# Patient Record
Sex: Female | Born: 1960 | Race: Black or African American | Hispanic: No | Marital: Married | Smoking: Never smoker
Health system: Southern US, Community
[De-identification: ages and names within clinical notes are randomized; demographics above are authoritative.]

## PROBLEM LIST (undated history)

## (undated) DIAGNOSIS — J40 Bronchitis, not specified as acute or chronic: Secondary | ICD-10-CM

## (undated) DIAGNOSIS — E669 Obesity, unspecified: Secondary | ICD-10-CM

## (undated) DIAGNOSIS — I10 Essential (primary) hypertension: Secondary | ICD-10-CM

## (undated) DIAGNOSIS — D573 Sickle-cell trait: Secondary | ICD-10-CM

## (undated) DIAGNOSIS — Z87442 Personal history of urinary calculi: Secondary | ICD-10-CM

## (undated) DIAGNOSIS — M1990 Unspecified osteoarthritis, unspecified site: Secondary | ICD-10-CM

## (undated) HISTORY — PX: CYST EXCISION: SHX5701

## (undated) HISTORY — PX: TUBAL LIGATION: SHX77

## (undated) HISTORY — DX: Obesity, unspecified: E66.9

## (undated) HISTORY — DX: Sickle-cell trait: D57.3

## (undated) HISTORY — PX: BACK SURGERY: SHX140

## (undated) HISTORY — PX: WRIST SURGERY: SHX841

---

## 2001-04-05 ENCOUNTER — Ambulatory Visit (HOSPITAL_COMMUNITY): Admission: RE | Admit: 2001-04-05 | Discharge: 2001-04-05 | Payer: Self-pay | Admitting: Family Medicine

## 2003-09-07 ENCOUNTER — Ambulatory Visit (HOSPITAL_COMMUNITY): Admission: RE | Admit: 2003-09-07 | Discharge: 2003-09-07 | Payer: Self-pay | Admitting: Family Medicine

## 2003-09-07 ENCOUNTER — Encounter: Payer: Self-pay | Admitting: Family Medicine

## 2003-12-21 ENCOUNTER — Ambulatory Visit (HOSPITAL_COMMUNITY): Admission: RE | Admit: 2003-12-21 | Discharge: 2003-12-21 | Payer: Self-pay | Admitting: Family Medicine

## 2004-02-03 ENCOUNTER — Emergency Department (HOSPITAL_COMMUNITY): Admission: EM | Admit: 2004-02-03 | Discharge: 2004-02-03 | Payer: Self-pay | Admitting: Emergency Medicine

## 2005-01-12 ENCOUNTER — Ambulatory Visit (HOSPITAL_COMMUNITY): Admission: RE | Admit: 2005-01-12 | Discharge: 2005-01-12 | Payer: Self-pay | Admitting: Family Medicine

## 2007-09-18 ENCOUNTER — Ambulatory Visit (HOSPITAL_COMMUNITY): Admission: EM | Admit: 2007-09-18 | Discharge: 2007-09-18 | Payer: Self-pay | Admitting: Emergency Medicine

## 2007-09-19 ENCOUNTER — Ambulatory Visit (HOSPITAL_COMMUNITY): Admission: RE | Admit: 2007-09-19 | Discharge: 2007-09-19 | Payer: Self-pay | Admitting: Family Medicine

## 2007-10-03 ENCOUNTER — Ambulatory Visit: Payer: Self-pay | Admitting: Orthopedic Surgery

## 2007-10-10 ENCOUNTER — Encounter: Payer: Self-pay | Admitting: Orthopedic Surgery

## 2009-09-10 ENCOUNTER — Encounter: Payer: Self-pay | Admitting: Orthopedic Surgery

## 2009-09-10 ENCOUNTER — Ambulatory Visit (HOSPITAL_COMMUNITY): Admission: RE | Admit: 2009-09-10 | Discharge: 2009-09-10 | Payer: Self-pay | Admitting: Family Medicine

## 2009-10-02 ENCOUNTER — Encounter (HOSPITAL_COMMUNITY): Admission: RE | Admit: 2009-10-02 | Discharge: 2009-11-01 | Payer: Self-pay | Admitting: Orthopedic Surgery

## 2010-01-26 ENCOUNTER — Emergency Department (HOSPITAL_COMMUNITY): Admission: EM | Admit: 2010-01-26 | Discharge: 2010-01-26 | Payer: Self-pay | Admitting: Emergency Medicine

## 2010-02-20 DIAGNOSIS — Z8679 Personal history of other diseases of the circulatory system: Secondary | ICD-10-CM | POA: Insufficient documentation

## 2010-02-24 ENCOUNTER — Ambulatory Visit: Payer: Self-pay | Admitting: Orthopedic Surgery

## 2010-02-24 DIAGNOSIS — M549 Dorsalgia, unspecified: Secondary | ICD-10-CM | POA: Insufficient documentation

## 2010-02-24 DIAGNOSIS — M5126 Other intervertebral disc displacement, lumbar region: Secondary | ICD-10-CM

## 2010-02-26 ENCOUNTER — Encounter (INDEPENDENT_AMBULATORY_CARE_PROVIDER_SITE_OTHER): Payer: Self-pay | Admitting: *Deleted

## 2010-03-01 ENCOUNTER — Encounter: Payer: Self-pay | Admitting: Orthopedic Surgery

## 2010-03-05 ENCOUNTER — Telehealth: Payer: Self-pay | Admitting: Orthopedic Surgery

## 2010-03-06 ENCOUNTER — Encounter (INDEPENDENT_AMBULATORY_CARE_PROVIDER_SITE_OTHER): Payer: Self-pay | Admitting: *Deleted

## 2010-03-11 ENCOUNTER — Telehealth: Payer: Self-pay | Admitting: Orthopedic Surgery

## 2010-03-18 ENCOUNTER — Encounter: Payer: Self-pay | Admitting: Orthopedic Surgery

## 2010-03-21 ENCOUNTER — Ambulatory Visit (HOSPITAL_COMMUNITY): Admission: RE | Admit: 2010-03-21 | Discharge: 2010-03-22 | Payer: Self-pay | Admitting: Neurosurgery

## 2010-04-15 ENCOUNTER — Encounter: Payer: Self-pay | Admitting: Orthopedic Surgery

## 2010-05-05 ENCOUNTER — Encounter: Payer: Self-pay | Admitting: Orthopedic Surgery

## 2010-05-24 ENCOUNTER — Emergency Department (HOSPITAL_COMMUNITY): Admission: EM | Admit: 2010-05-24 | Discharge: 2010-05-24 | Payer: Self-pay | Admitting: Emergency Medicine

## 2010-05-29 ENCOUNTER — Encounter: Payer: Self-pay | Admitting: Orthopedic Surgery

## 2010-06-10 ENCOUNTER — Encounter: Payer: Self-pay | Admitting: Orthopedic Surgery

## 2010-06-17 ENCOUNTER — Encounter: Admission: RE | Admit: 2010-06-17 | Discharge: 2010-06-17 | Payer: Self-pay | Admitting: Neurosurgery

## 2010-07-01 ENCOUNTER — Encounter: Admission: RE | Admit: 2010-07-01 | Discharge: 2010-07-01 | Payer: Self-pay | Admitting: Neurosurgery

## 2010-07-08 ENCOUNTER — Encounter: Payer: Self-pay | Admitting: Orthopedic Surgery

## 2010-07-23 ENCOUNTER — Encounter: Admission: RE | Admit: 2010-07-23 | Discharge: 2010-07-23 | Payer: Self-pay | Admitting: Neurosurgery

## 2010-08-11 ENCOUNTER — Encounter: Payer: Self-pay | Admitting: Orthopedic Surgery

## 2010-08-27 ENCOUNTER — Ambulatory Visit (HOSPITAL_COMMUNITY): Admission: RE | Admit: 2010-08-27 | Discharge: 2010-08-28 | Payer: Self-pay | Admitting: Neurosurgery

## 2010-10-17 ENCOUNTER — Encounter: Payer: Self-pay | Admitting: Orthopedic Surgery

## 2010-10-27 ENCOUNTER — Encounter: Payer: Self-pay | Admitting: Orthopedic Surgery

## 2010-11-25 ENCOUNTER — Encounter: Payer: Self-pay | Admitting: Orthopedic Surgery

## 2011-01-18 ENCOUNTER — Encounter: Payer: Self-pay | Admitting: Otolaryngology

## 2011-01-29 NOTE — Letter (Signed)
Summary: [facility] office note Dr Letitia Libra office note Dr Franky Macho   Imported By: Cammie Sickle 10/18/2010 19:09:11  _____________________________________________________________________  External Attachment:    Type:   Image     Comment:   External Document

## 2011-01-29 NOTE — Miscellaneous (Signed)
Summary: L5-S1 esi order  Clinical Lists Changes  Orders: Added new Referral order of Misc. Referral (Misc. Ref) - Signed

## 2011-01-29 NOTE — Consult Note (Signed)
Summary: Consultation Report from Dr, Coletta Memos  Consultation Report from Dr, Coletta Memos   Imported By: Jacklynn Ganong 12/23/2010 16:42:45  _____________________________________________________________________  External Attachment:    Type:   Image     Comment:   External Document

## 2011-01-29 NOTE — Letter (Signed)
Summary: History form  History form   Imported By: Jacklynn Ganong 02/25/2010 13:04:56  _____________________________________________________________________  External Attachment:    Type:   Image     Comment:   External Document

## 2011-01-29 NOTE — Letter (Signed)
Summary: *Orthopedic Consult Note  [facility]  [address] [place] [address]  [place], [place] [address]   Phone: [phone]  Fax: [phone]    Re:    TIFINI REEDER DOB:    [birth year]   Dear: Dr. Regino Schultze    Thank you for requesting that we see the above patient for consultation.  A copy of the detailed office note will be sent under separate cover, for your review.  Evaluation today is consistent with:  1)  H N P-LUMBAR (ICD-722.10) 2)  BACK PAIN (ICD-724.5)     Our recommendation is for: steroid dose pack and MRI then either ESI or Neurosurgery consult. Continue same medications as you are doing then taper off Norco.        Thank you for this opportunity to look after your patient.  Sincerely,   Terrance Mass. MD.

## 2011-01-29 NOTE — Letter (Signed)
Summary: [facility] office note Dr Letitia Libra office note Dr Franky Macho   Imported By: Cammie Sickle 07/10/2010 10:01:39  _____________________________________________________________________  External Attachment:    Type:   Image     Comment:   External Document

## 2011-01-29 NOTE — Progress Notes (Signed)
Summary: Neurosurgeon appointment.  Phone Note From Other Clinic   Caller: Referral Coordinator Summary of Call: Patient has an appointment with Dr. Franky Macho on 03-18-10 at 2:00. Patient is aware of her appointment.

## 2011-01-29 NOTE — Letter (Signed)
Summary: [facility] Office notes Dr Letitia Libra Office notes Dr Franky Macho   Imported By: Cammie Sickle 08/19/2010 11:55:32  _____________________________________________________________________  External Attachment:    Type:   Image     Comment:   External Document

## 2011-01-29 NOTE — Progress Notes (Signed)
Summary: Initial evaluation  Initial evaluation   Imported By: Jacklynn Ganong 02/21/2010 07:45:40  _____________________________________________________________________  External Attachment:    Type:   Image     Comment:   External Document

## 2011-01-29 NOTE — Letter (Signed)
Summary: [facility] office note Dr Letitia Libra office note Dr Franky Macho   Imported By: Cammie Sickle 06/20/2010 09:35:12  _____________________________________________________________________  External Attachment:    Type:   Image     Comment:   External Document

## 2011-01-29 NOTE — Letter (Signed)
Summary: [facility] office note Dr Letitia Libra office note Dr Franky Macho   Imported By: Cammie Sickle 05/28/2010 11:27:55  _____________________________________________________________________  External Attachment:    Type:   Image     Comment:   External Document

## 2011-01-29 NOTE — Miscellaneous (Signed)
Summary: open mri 03/01/10 at 915am [facility]  Clinical Lists Changes  Precert number [id number] expires 45 days from today, patient to be called with results of MRI of the L spine [facility]. I left Message on machine for patient.

## 2011-01-29 NOTE — Letter (Signed)
Summary: [facility] office note Dr Letitia Libra office note Dr Franky Macho   Imported By: Cammie Sickle 07/30/2010 10:03:43  _____________________________________________________________________  External Attachment:    Type:   Image     Comment:   External Document

## 2011-01-29 NOTE — Assessment & Plan Note (Signed)
Summary: NEW PROB/AP ER FOL/UP/LT HIP PAIN/HAD XR AP 01/26/10/[facility]   Vital Signs:  Patient profile:   50 year old female Weight:      309 pounds Pulse rate:   70 / minute Resp:     16 per minute  Vitals Entered By: Fuller Canada MD (February 24, 2010 1:29 PM)  Visit Type:  new patient Referring Provider:  ap er Primary Provider:  Dr. Regino Schultze  CC:  left hip pain.  History of Present Illness: 50 year-old female presents with LEFT hip pain, which is actually in her region and present for 2 months. No injury came on gradually she complains of severe throbbing constant pain all day and worse at night. Her pain is unrelieved by Norco 7.5, and diclofenac 75 b.i.d. she was seen in [place], had physical therapy. She does have a history of sciatica  Xrays Left hip 01/26/10 and L spine 09/10/09.  Meds: Lisinopril.  Was given Norco 7.5 and Diclofenac 75mg  from er 01/26/10, no relief.        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 Cardiac :  Denies chest pain, angina, heart attack, heart failure, poor circulation, blood clots, and phlebitis. Resp:  Denies short of breath, difficulty breathing, COPD, cough, and pneumonia. GI:  Denies nausea, vomiting, diarrhea, constipation, difficulty swallowing, ulcers, GERD, and reflux. GU:  Denies kidney failure, kidney transplant, kidney stones, burning, poor stream, testicular cancer, blood in urine, and . Neuro:  Denies headache, dizziness, migraines, numbness, weakness, tremor, and unsteady walking. MS:  Denies joint pain, rheumatoid  arthritis, joint swelling, gout, bone cancer, osteoporosis, and . Endo:  Denies thyroid disease, goiter, and diabetes. Psych:  Denies depression, mood swings, anxiety, panic attack, bipolar, and schizophrenia. Derm:  Denies eczema, cancer, and itching. EENT:  Denies poor vision, cataracts, glaucoma, poor hearing, vertigo, ears ringing, sinusitis, hoarseness, toothaches, and bleeding gums; wears glasses. Immunology:  Denies seasonal allergies, sinus problems, and allergic to bee stings. Lymphatic:  Denies lymph node cancer and lymph edema.  Physical Exam  Additional Exam:  GEN: well developed, well nourished, normal grooming and hygiene, no deformity and obesity  CDV: pulses are normal, no edema, no erythema. no tenderness  Lymph: normal lymph nodes   Skin: no rashes, skin lesions or open sores   NEURO: normal coordination, reflexes, sensation.   Psyche: awake, alert and oriented. Mood normal   Gait: normal  Lumbar spine is tender in the midline and increased lumbar lordosis, tender over the LEFT lumbar area.  Normal motor strength in both lower extremities no joint laxity.     Impression & Recommendations:  Problem # 1:  H N P-LUMBAR (ICD-722.10) Assessment New x-rays at the hospital include hip which were normal 3 views and lumbar spine which shows a scoliotic spine which is not structural  Patient was seen and going up her arm as well and they sent her for therapy  She needs an MRI and then either an epidural or a neurosurgical consult  I would like to try Dosepak and see if that helps if not she can continue on her present medications as  ordered by her primary care physician Orders: New Patient Level III (82956)  Problem # 2:  BACK PAIN (ICD-724.5) Assessment: New  Orders: New Patient Level III (21308)  Medications Added to Medication List This Visit: 1)  Prednisone (pak) 10 Mg Tabs (Prednisone) .... As directed for 12 days  Patient Instructions: 1)  MRI we  will call with results Prescriptions: PREDNISONE (PAK) 10 MG TABS (PREDNISONE) as directed for 12 days  #1 x 0   Entered and Authorized by:   Fuller Canada MD   Signed by:   Fuller Canada MD on 02/24/2010   Method used:   Print then Give to Patient   RxID:   [id number]

## 2011-01-29 NOTE — Letter (Signed)
Summary: [facility] Office notes Dr Letitia Libra Office notes Dr Franky Macho   Imported By: Cammie Sickle 05/21/2010 12:17:09  _____________________________________________________________________  External Attachment:    Type:   Image     Comment:   External Document

## 2011-01-29 NOTE — Miscellaneous (Signed)
Summary: [facility] order  Clinical Lists Changes  Orders: Added new Referral order of Neurosurgeon Referral (Neurosurgeon) - Signed

## 2011-01-29 NOTE — Letter (Signed)
Summary: [facility] Office notes Dr Letitia Libra Office notes Dr Franky Macho   Imported By: Cammie Sickle 11/21/2010 13:49:17  _____________________________________________________________________  External Attachment:    Type:   Image     Comment:   External Document

## 2011-01-29 NOTE — Progress Notes (Signed)
Summary: patient called MRI report  Phone Note Call from Patient   Caller: Patient Summary of Call: Patient calling to find out results of MRI. Report rec'd and she had dropped off films.  Home ph [phone]. Initial call taken by: Cammie Sickle,  March 05, 2010 4:53 PM

## 2011-01-29 NOTE — Consult Note (Signed)
Summary: Consult [facility] Dr Coletta Memos  Consult [facility] Dr Coletta Memos   Imported By: Cammie Sickle 04/03/2010 15:35:21  _____________________________________________________________________  External Attachment:    Type:   Image     Comment:   External Document

## 2011-03-13 LAB — BASIC METABOLIC PANEL
BUN: 7 mg/dL (ref 6–23)
CO2: 23 mEq/L (ref 19–32)
Calcium: 9.4 mg/dL (ref 8.4–10.5)
Chloride: 104 mEq/L (ref 96–112)
Creatinine, Ser: 0.83 mg/dL (ref 0.4–1.2)
GFR calc Af Amer: >60 mL/min (ref >60)
GFR calc non Af Amer: >60 mL/min (ref >60)
Glucose, Bld: 86 mg/dL (ref 70–99)
Potassium: 3.8 mEq/L (ref 3.5–5.1)
Sodium: 135 mEq/L (ref 135–145)

## 2011-03-13 LAB — CBC
HCT: 35.3 % — ABNORMAL LOW (ref 36.0–46.0)
Hemoglobin: 11.8 g/dL — ABNORMAL LOW (ref 12.0–15.0)
MCH: 27.1 pg (ref 26.0–34.0)
MCHC: 33.4 g/dL (ref 30.0–36.0)
MCV: 81.1 fL (ref 78.0–100.0)
Platelets: 254 10*3/uL (ref 150–400)
RBC: 4.35 MIL/uL (ref 3.87–5.11)
RDW: 14.2 % (ref 11.5–15.5)
WBC: 9.1 10*3/uL (ref 4.0–10.5)

## 2011-03-13 LAB — SURGICAL PCR SCREEN
MRSA, PCR: NEGATIVE
Staphylococcus aureus: NEGATIVE

## 2011-03-13 LAB — HCG, SERUM, QUALITATIVE: Preg, Serum: NEGATIVE

## 2011-03-23 LAB — COMPREHENSIVE METABOLIC PANEL
ALT: 16 U/L (ref 0–35)
AST: 18 U/L (ref 0–37)
Albumin: 4.2 g/dL (ref 3.5–5.2)
Alkaline Phosphatase: 64 U/L (ref 39–117)
BUN: 12 mg/dL (ref 6–23)
CO2: 26 mEq/L (ref 19–32)
Calcium: 9.8 mg/dL (ref 8.4–10.5)
Chloride: 100 mEq/L (ref 96–112)
Creatinine, Ser: 0.98 mg/dL (ref 0.4–1.2)
GFR calc Af Amer: >60 mL/min (ref >60)
GFR calc non Af Amer: >60 mL/min (ref >60)
Glucose, Bld: 90 mg/dL (ref 70–99)
Potassium: 4 mEq/L (ref 3.5–5.1)
Sodium: 137 mEq/L (ref 135–145)
Total Bilirubin: 0.6 mg/dL (ref 0.3–1.2)
Total Protein: 7.6 g/dL (ref 6.0–8.3)

## 2011-03-23 LAB — URINE MICROSCOPIC-ADD ON

## 2011-03-23 LAB — CBC
HCT: 36.7 % (ref 36.0–46.0)
Hemoglobin: 12.2 g/dL (ref 12.0–15.0)
MCHC: 33.3 g/dL (ref 30.0–36.0)
MCV: 80.3 fL (ref 78.0–100.0)
Platelets: 278 10*3/uL (ref 150–400)
RBC: 4.57 MIL/uL (ref 3.87–5.11)
RDW: 14.9 % (ref 11.5–15.5)
WBC: 10 10*3/uL (ref 4.0–10.5)

## 2011-03-23 LAB — URINALYSIS, ROUTINE W REFLEX MICROSCOPIC
Bilirubin Urine: NEGATIVE
Glucose, UA: NEGATIVE mg/dL
Ketones, ur: NEGATIVE mg/dL
Nitrite: NEGATIVE
Protein, ur: NEGATIVE mg/dL
Specific Gravity, Urine: 1.012 (ref 1.005–1.030)
Urobilinogen, UA: 0.2 mg/dL (ref 0.0–1.0)
pH: 6.5 (ref 5.0–8.0)

## 2011-03-23 LAB — SURGICAL PCR SCREEN
MRSA, PCR: NEGATIVE
Staphylococcus aureus: NEGATIVE

## 2011-04-11 ENCOUNTER — Emergency Department (HOSPITAL_COMMUNITY)
Admission: EM | Admit: 2011-04-11 | Discharge: 2011-04-11 | Disposition: A | Discharge status: Home / Self Care | Payer: 59 | Attending: Emergency Medicine | Admitting: Emergency Medicine

## 2011-04-11 DIAGNOSIS — M549 Dorsalgia, unspecified: Secondary | ICD-10-CM | POA: Insufficient documentation

## 2011-04-11 DIAGNOSIS — X500XXA Overexertion from strenuous movement or load, initial encounter: Secondary | ICD-10-CM | POA: Insufficient documentation

## 2011-04-11 DIAGNOSIS — M542 Cervicalgia: Secondary | ICD-10-CM | POA: Insufficient documentation

## 2011-04-11 DIAGNOSIS — Z79899 Other long term (current) drug therapy: Secondary | ICD-10-CM | POA: Insufficient documentation

## 2011-04-11 DIAGNOSIS — IMO0002 Reserved for concepts with insufficient information to code with codable children: Secondary | ICD-10-CM | POA: Insufficient documentation

## 2011-04-11 DIAGNOSIS — Y929 Unspecified place or not applicable: Secondary | ICD-10-CM | POA: Insufficient documentation

## 2011-05-27 ENCOUNTER — Other Ambulatory Visit: Payer: Self-pay | Admitting: Obstetrics and Gynecology

## 2011-05-27 ENCOUNTER — Other Ambulatory Visit (HOSPITAL_COMMUNITY)
Admission: RE | Admit: 2011-05-27 | Discharge: 2011-05-27 | Disposition: A | Discharge status: Home / Self Care | Payer: 59 | Source: Ambulatory Visit | Attending: Obstetrics and Gynecology | Admitting: Obstetrics and Gynecology

## 2011-05-27 DIAGNOSIS — Z139 Encounter for screening, unspecified: Secondary | ICD-10-CM

## 2011-05-27 DIAGNOSIS — Z01419 Encounter for gynecological examination (general) (routine) without abnormal findings: Secondary | ICD-10-CM | POA: Insufficient documentation

## 2011-06-02 ENCOUNTER — Ambulatory Visit (HOSPITAL_COMMUNITY)
Admission: RE | Admit: 2011-06-02 | Discharge: 2011-06-02 | Disposition: A | Discharge status: Home / Self Care | Payer: 59 | Source: Ambulatory Visit | Attending: Obstetrics and Gynecology | Admitting: Obstetrics and Gynecology

## 2011-06-02 DIAGNOSIS — Z139 Encounter for screening, unspecified: Secondary | ICD-10-CM

## 2011-06-02 DIAGNOSIS — Z1231 Encounter for screening mammogram for malignant neoplasm of breast: Secondary | ICD-10-CM | POA: Insufficient documentation

## 2011-06-05 ENCOUNTER — Other Ambulatory Visit: Payer: Self-pay | Admitting: Obstetrics and Gynecology

## 2011-06-05 DIAGNOSIS — R928 Other abnormal and inconclusive findings on diagnostic imaging of breast: Secondary | ICD-10-CM

## 2011-06-17 ENCOUNTER — Ambulatory Visit (HOSPITAL_COMMUNITY)
Admission: RE | Admit: 2011-06-17 | Discharge: 2011-06-17 | Disposition: A | Discharge status: Home / Self Care | Payer: 59 | Source: Ambulatory Visit | Attending: Obstetrics and Gynecology | Admitting: Obstetrics and Gynecology

## 2011-06-17 ENCOUNTER — Other Ambulatory Visit: Payer: Self-pay | Admitting: Obstetrics and Gynecology

## 2011-06-17 ENCOUNTER — Other Ambulatory Visit (HOSPITAL_COMMUNITY): Payer: Self-pay | Admitting: Obstetrics and Gynecology

## 2011-06-17 DIAGNOSIS — R928 Other abnormal and inconclusive findings on diagnostic imaging of breast: Secondary | ICD-10-CM

## 2011-06-17 DIAGNOSIS — Z09 Encounter for follow-up examination after completed treatment for conditions other than malignant neoplasm: Secondary | ICD-10-CM

## 2011-10-25 ENCOUNTER — Emergency Department (HOSPITAL_COMMUNITY)
Admission: EM | Admit: 2011-10-25 | Discharge: 2011-10-25 | Disposition: A | Discharge status: Home / Self Care | Payer: BC Managed Care – PPO | Attending: Emergency Medicine | Admitting: Emergency Medicine

## 2011-10-25 ENCOUNTER — Emergency Department (HOSPITAL_COMMUNITY): Payer: BC Managed Care – PPO

## 2011-10-25 DIAGNOSIS — R0602 Shortness of breath: Secondary | ICD-10-CM | POA: Insufficient documentation

## 2011-10-25 DIAGNOSIS — R062 Wheezing: Secondary | ICD-10-CM | POA: Insufficient documentation

## 2011-10-25 DIAGNOSIS — IMO0001 Reserved for inherently not codable concepts without codable children: Secondary | ICD-10-CM | POA: Insufficient documentation

## 2011-10-25 DIAGNOSIS — R05 Cough: Secondary | ICD-10-CM | POA: Insufficient documentation

## 2011-10-25 DIAGNOSIS — R51 Headache: Secondary | ICD-10-CM | POA: Insufficient documentation

## 2011-10-25 DIAGNOSIS — I10 Essential (primary) hypertension: Secondary | ICD-10-CM | POA: Insufficient documentation

## 2011-10-25 DIAGNOSIS — R059 Cough, unspecified: Secondary | ICD-10-CM | POA: Insufficient documentation

## 2011-10-25 DIAGNOSIS — R0789 Other chest pain: Secondary | ICD-10-CM | POA: Insufficient documentation

## 2011-10-25 HISTORY — DX: Essential (primary) hypertension: I10

## 2011-10-25 MED ORDER — HYDROCOD POLST-CHLORPHEN POLST 10-8 MG/5ML PO LQCR: ORAL | Status: DC

## 2011-10-25 MED ORDER — AZITHROMYCIN 250 MG PO TABS
500.0000 mg | ORAL_TABLET | Freq: Once | ORAL | Status: AC
  Administered 2011-10-25: 500 mg via ORAL
  Filled 2011-10-25: qty 2

## 2011-10-25 MED ORDER — AZITHROMYCIN 250 MG PO TABS: ORAL_TABLET | ORAL | Status: DC

## 2011-10-25 NOTE — ED Provider Notes (Signed)
History     CSN: [id number] Arrival date & time: 10/25/2011  9:56 AM   First MD Initiated Contact with Patient 10/25/11 [phone]      Chief Complaint  Patient presents with  . Cough    (Consider location/radiation/quality/duration/timing/severity/associated sxs/prior treatment) HPI Comments: Patient c/o persistent, intermittent cough for two weeks.  States she was seen by PMD at the onset of symptoms and given a "steroid shot" and an inhaler but patient states the symptoms have not improved.  She also c/o headache , laryngitis, and body aches.  She denies fever, vomiting, chest pain or LE edema.  Also states that she has had recent contact with children who have similar symptoms  Patient is a 50 y.o. female presenting with cough. The history is provided by the patient.  Cough This is a new problem. The current episode started more than 1 week ago. The problem occurs every few minutes. The problem has been gradually worsening. The cough is non-productive. There has been no fever. Associated symptoms include chills, headaches, rhinorrhea, myalgias, shortness of breath and wheezing. Pertinent negatives include no chest pain, no weight loss, no ear congestion, no ear pain, no sore throat and no eye redness. She has tried cough syrup and decongestants for the symptoms. The treatment provided no relief. She is not a smoker. Her past medical history is significant for asthma. Her past medical history does not include pneumonia or COPD.    Past Medical History  Diagnosis Date  . Hypertension     Past Surgical History  Procedure Date  . Back surgery     History reviewed. No pertinent family history.  History  Substance Use Topics  . Smoking status: Not on file  . Smokeless tobacco: Not on file  . Alcohol Use: Yes    OB History    Grav Para Term Preterm Abortions TAB SAB Ect Mult Living                  Review of Systems  Constitutional: Positive for chills. Negative for fever,  weight loss, activity change, appetite change and fatigue.  HENT: Positive for rhinorrhea. Negative for ear pain, sore throat, trouble swallowing, neck pain and neck stiffness.   Eyes: Negative for redness and visual disturbance.  Respiratory: Positive for cough, chest tightness, shortness of breath and wheezing.   Cardiovascular: Negative for chest pain and palpitations.  Gastrointestinal: Negative for nausea, vomiting and abdominal pain.  Genitourinary: Negative for dysuria and flank pain.  Musculoskeletal: Positive for myalgias. Negative for back pain and arthralgias.  Skin: Negative for rash.  Neurological: Positive for headaches. Negative for dizziness, weakness and numbness.  Hematological: Does not bruise/bleed easily.  All other systems reviewed and are negative.    Allergies  Review of patient's allergies indicates no known allergies.  Home Medications   Current Outpatient Rx  Name Route Sig Dispense Refill  . ALBUTEROL SULFATE HFA 108 (90 BASE) MCG/ACT IN AERS Inhalation Inhale 2 puffs into the lungs every 6 (six) hours as needed. For shortness of breath     . VITAMIN D 1000 UNITS PO TABS Oral Take 1,000 Units by mouth daily.      . IBUPROFEN 200 MG PO TABS Oral Take 400 mg by mouth every 6 (six) hours as needed. For headaches     . LISINOPRIL-HYDROCHLOROTHIAZIDE 20-12.5 MG PO TABS Oral Take 1 tablet by mouth daily.      . OMEGA-3-ACID ETHYL ESTERS 1 G PO CAPS Oral Take 1 g by  mouth 2 (two) times daily.      Marland Kitchen VITAMIN C 500 MG PO TABS Oral Take 500 mg by mouth daily.        BP 140/92  Pulse 64  Temp(Src) 98.2 F (36.8 C) (Oral)  Resp 20  Ht 5\' 4"  (1.626 m)  Wt 300 lb (136.079 kg)  BMI 51.49 kg/m2  SpO2 100%  LMP 10/04/2011  Physical Exam  Nursing note and vitals reviewed. Constitutional: She is oriented to person, place, and time. She appears well-developed and well-nourished. No distress.  HENT:  Head: Normocephalic and atraumatic. No trismus in the jaw.    Right Ear: Tympanic membrane normal. No mastoid tenderness. No hemotympanum.  Left Ear: Tympanic membrane normal. No mastoid tenderness. No hemotympanum.  Nose: Nose normal.  Mouth/Throat: Uvula is midline, oropharynx is clear and moist and mucous membranes are normal. No uvula swelling.       Voice is coarse  Neck: Normal range of motion. Neck supple.  Cardiovascular: Normal rate, regular rhythm and normal heart sounds.   Pulmonary/Chest: Effort normal and breath sounds normal. No respiratory distress. She has no decreased breath sounds. She has no wheezes. She has no rhonchi. She has no rales. She exhibits no mass and no tenderness.  Abdominal: Soft. She exhibits no distension. There is no tenderness.  Musculoskeletal: Normal range of motion. She exhibits no edema and no tenderness.  Lymphadenopathy:    She has no cervical adenopathy.  Neurological: She is alert and oriented to person, place, and time. She has normal reflexes. No cranial nerve deficit. She exhibits normal muscle tone. Coordination normal.  Skin: Skin is warm and dry.    ED Course  Procedures (including critical care time)  Dg Chest 2 View  10/25/2011  *RADIOLOGY REPORT*  Clinical Data: Cough, fever  CHEST - 2 VIEW  Comparison: 03/20/2010  Findings: Cardiomediastinal silhouette is stable.  No pulmonary edema.  There is left base retrocardiac atelectasis or infiltrate. Bony thorax is stable.  IMPRESSION: No pulmonary edema.  Left base retrocardiac atelectasis or infiltrate.  Original Report Authenticated By: Natasha Mead, M.D.       MDM    10:45 AM patient is smiling and alert, NAD.  Vitals are stable.  No fever, hypoxia, tachycardia, or tachypnea.  Lung sounds are CTA bilaterally, my clinical suspicion for pneumonia is low.  Given persistence of her sx's and x-ray finding, I will start antibiotic and advised her to continue the inhaler as directed.  She agrees to close up with her PMD this week.    Patient / Family /  Caregiver understand and agree with initial ED impression and plan with expectations set for ED visit.         Timber Lucarelli L. Deklynn Charlet, PA 10/25/11 1050

## 2011-10-25 NOTE — ED Notes (Signed)
Complain of cough, and sob for two weeks. States she has been to her pcp and got an inhaler

## 2011-10-25 NOTE — ED Provider Notes (Signed)
Medical screening examination/treatment/procedure(s) were performed by non-physician practitioner and as supervising physician I was immediately available for consultation/collaboration.   Shelda Jakes, MD 10/25/11 1055

## 2012-01-26 ENCOUNTER — Other Ambulatory Visit: Payer: Self-pay | Admitting: Obstetrics and Gynecology

## 2012-01-26 DIAGNOSIS — Z139 Encounter for screening, unspecified: Secondary | ICD-10-CM

## 2012-02-03 ENCOUNTER — Ambulatory Visit (HOSPITAL_COMMUNITY)
Admission: RE | Admit: 2012-02-03 | Discharge: 2012-02-03 | Disposition: A | Discharge status: Home / Self Care | Payer: BC Managed Care – PPO | Source: Ambulatory Visit | Attending: Obstetrics and Gynecology | Admitting: Obstetrics and Gynecology

## 2012-02-03 DIAGNOSIS — Z09 Encounter for follow-up examination after completed treatment for conditions other than malignant neoplasm: Secondary | ICD-10-CM | POA: Insufficient documentation

## 2012-02-03 DIAGNOSIS — Z139 Encounter for screening, unspecified: Secondary | ICD-10-CM

## 2012-02-03 DIAGNOSIS — N6489 Other specified disorders of breast: Secondary | ICD-10-CM | POA: Insufficient documentation

## 2012-04-01 ENCOUNTER — Emergency Department (HOSPITAL_COMMUNITY)
Admission: EM | Admit: 2012-04-01 | Discharge: 2012-04-01 | Disposition: A | Discharge status: Home / Self Care | Payer: BC Managed Care – PPO | Attending: Emergency Medicine | Admitting: Emergency Medicine

## 2012-04-01 ENCOUNTER — Emergency Department (HOSPITAL_COMMUNITY): Payer: BC Managed Care – PPO

## 2012-04-01 ENCOUNTER — Encounter (HOSPITAL_COMMUNITY): Payer: Self-pay

## 2012-04-01 DIAGNOSIS — I10 Essential (primary) hypertension: Secondary | ICD-10-CM | POA: Insufficient documentation

## 2012-04-01 DIAGNOSIS — M25519 Pain in unspecified shoulder: Secondary | ICD-10-CM | POA: Insufficient documentation

## 2012-04-01 DIAGNOSIS — M546 Pain in thoracic spine: Secondary | ICD-10-CM | POA: Insufficient documentation

## 2012-04-01 DIAGNOSIS — R51 Headache: Secondary | ICD-10-CM | POA: Insufficient documentation

## 2012-04-01 DIAGNOSIS — T148XXA Other injury of unspecified body region, initial encounter: Secondary | ICD-10-CM | POA: Insufficient documentation

## 2012-04-01 DIAGNOSIS — R209 Unspecified disturbances of skin sensation: Secondary | ICD-10-CM | POA: Insufficient documentation

## 2012-04-01 DIAGNOSIS — M542 Cervicalgia: Secondary | ICD-10-CM | POA: Insufficient documentation

## 2012-04-01 DIAGNOSIS — X58XXXA Exposure to other specified factors, initial encounter: Secondary | ICD-10-CM | POA: Insufficient documentation

## 2012-04-01 DIAGNOSIS — IMO0001 Reserved for inherently not codable concepts without codable children: Secondary | ICD-10-CM | POA: Insufficient documentation

## 2012-04-01 MED ORDER — HYDROCODONE-ACETAMINOPHEN 5-325 MG PO TABS: ORAL_TABLET | ORAL | Status: AC

## 2012-04-01 MED ORDER — METHOCARBAMOL 500 MG PO TABS: ORAL_TABLET | ORAL | Status: DC

## 2012-04-01 NOTE — Discharge Instructions (Signed)
Muscle Strain A muscle strain (pulled muscle) happens when a muscle is over-stretched. Recovery usually takes 5 to 6 weeks.  HOME CARE   Put ice on the injured area.   Put ice in a plastic bag.   Place a towel between your skin and the bag.   Leave the ice on for 15 to 20 minutes at a time, every hour for the first 2 days.   Do not use the muscle for several days or until your doctor says you can. Do not use the muscle if you have pain.   Wrap the injured area with an elastic bandage for comfort. Do not put it on too tightly.   Only take medicine as told by your doctor.   Warm up before exercise. This helps prevent muscle strains.  GET HELP RIGHT AWAY IF:  There is increased pain or puffiness (swelling) in the affected area. MAKE SURE YOU:   Understand these instructions.   Will watch your condition.   Will get help right away if you are not doing well or get worse.  Document Released: 09/22/2008 Document Revised: 12/03/2011 Document Reviewed: 09/22/2008 [employer] Patient Information 2012 [employer]. 

## 2012-04-01 NOTE — ED Notes (Signed)
Pt presents with intermittent back, shoulder, neck and head pain x 2 months. Pt with Hx of back surgery.

## 2012-04-01 NOTE — ED Provider Notes (Signed)
History     CSN: [id number]  Arrival date & time 04/01/12  1335   First MD Initiated Contact with Patient 04/01/12 1407      Chief Complaint  Patient presents with  . Back Pain  . Shoulder Pain  . Neck Pain  . Headache    (Consider location/radiation/quality/duration/timing/severity/associated sxs/prior treatment) HPI Comments: Patient complains of pain to her left neck, left upper back and shoulder for 2 months. She also reports intermittent numbness and tingling sensations to her left arm. She states pain is worse with movement of her neck or abduction of her left arm. Pain improves somewhat with rest and ibuprofen she describes the pain as a aching with intermittent sharp pains. She denies chest pain, dyspnea, nausea, headaches, visual changes, neck stiffness or vomiting.  Patient is a 51 y.o. female presenting with shoulder pain. The history is provided by the patient. No language interpreter was used.  Shoulder Pain This is a chronic problem. The current episode started more than 1 month ago. The problem occurs constantly. The problem has been gradually worsening. Associated symptoms include arthralgias, myalgias, neck pain and numbness. Pertinent negatives include no abdominal pain, chest pain, coughing, fever, headaches, joint swelling, nausea, rash, sore throat, swollen glands, vertigo, visual change, vomiting or weakness. The symptoms are aggravated by twisting (Movement and palpation). She has tried NSAIDs for the symptoms. The treatment provided moderate relief.    Past Medical History  Diagnosis Date  . Hypertension     Past Surgical History  Procedure Date  . Back surgery     No family history on file.  History  Substance Use Topics  . Smoking status: Not on file  . Smokeless tobacco: Not on file  . Alcohol Use: Yes    OB History    Grav Para Term Preterm Abortions TAB SAB Ect Mult Living                  Review of Systems  Constitutional: Negative for  fever, activity change and appetite change.  HENT: Positive for neck pain. Negative for sore throat, facial swelling, trouble swallowing and neck stiffness.   Respiratory: Negative for cough and shortness of breath.   Cardiovascular: Negative for chest pain.  Gastrointestinal: Negative for nausea, vomiting and abdominal pain.  Genitourinary: Negative for flank pain and difficulty urinating.  Musculoskeletal: Positive for myalgias, back pain and arthralgias. Negative for joint swelling.  Skin: Negative.  Negative for rash.  Neurological: Positive for numbness. Negative for dizziness, vertigo, facial asymmetry, weakness and headaches.  All other systems reviewed and are negative.    Allergies  Review of patient's allergies indicates no known allergies.  Home Medications   Current Outpatient Rx  Name Route Sig Dispense Refill  . ALBUTEROL SULFATE HFA 108 (90 BASE) MCG/ACT IN AERS Inhalation Inhale 2 puffs into the lungs every 6 (six) hours as needed. For shortness of breath     . VITAMIN D 1000 UNITS PO TABS Oral Take 1,000 Units by mouth daily.      . OMEGA-3 FATTY ACIDS 1000 MG PO CAPS Oral Take 1 g by mouth 2 (two) times daily.    . IBUPROFEN 200 MG PO TABS Oral Take 400 mg by mouth every 6 (six) hours as needed. For headaches     . LISINOPRIL-HYDROCHLOROTHIAZIDE 20-12.5 MG PO TABS Oral Take 1 tablet by mouth daily.      Marland Kitchen VITAMIN C 500 MG PO TABS Oral Take 500 mg by mouth daily.      Marland Kitchen  HYDROCODONE-ACETAMINOPHEN 5-325 MG PO TABS  Take one-two tabs po q 4-6 hrs prn pain 20 tablet 0  . METHOCARBAMOL 500 MG PO TABS  Take two tabs po TID prn muscle spasms 42 tablet 0    BP 117/80  Pulse 73  Temp(Src) 97.8 F (36.6 C) (Oral)  Ht 5\' 4"  (1.626 m)  Wt 295 lb 6 oz (133.981 kg)  BMI 50.70 kg/m2  SpO2 100%  LMP 03/26/2012  Physical Exam  Nursing note and vitals reviewed. Constitutional: She is oriented to person, place, and time. She appears well-developed and well-nourished. No  distress.  HENT:  Head: Normocephalic and atraumatic.  Mouth/Throat: Oropharynx is clear and moist.  Neck: Normal range of motion and phonation normal. Neck supple. Muscular tenderness present. No spinous process tenderness present. No rigidity. No edema, no erythema and normal range of motion present. No Brudzinski's sign and no Kernig's sign noted.  Cardiovascular: Normal rate, regular rhythm, normal heart sounds and intact distal pulses.   No murmur heard. Pulmonary/Chest: Effort normal and breath sounds normal. No respiratory distress. She exhibits no tenderness.  Abdominal: There is no tenderness.  Musculoskeletal: Normal range of motion. She exhibits tenderness. She exhibits no edema.       Cervical back: She exhibits tenderness and pain. She exhibits normal range of motion, no bony tenderness, no swelling, no edema and normal pulse.       Back:       Tenderness to palpation along the left cervical paraspinal muscles and trapezius and along the border of the left scapula.  Lymphadenopathy:    She has no cervical adenopathy.  Neurological: She is alert and oriented to person, place, and time. She has normal reflexes. She exhibits normal muscle tone. Coordination normal.  Skin: Skin is warm and dry.    ED Course  Procedures (including critical care time)  Labs Reviewed - No data to display Dg Cervical Spine Complete  04/01/2012  *RADIOLOGY REPORT*  Clinical Data: Neck pain extends into the left shoulder for 4-5 days.  CERVICAL SPINE - 4+ VIEWS  Comparison:  None.  Findings:  There is no evidence of cervical spine fracture or prevertebral soft tissue swelling.  Alignment is normal.  No other significant bone abnormalities are identified.With regard to the specific symptoms, the left sided neural foramina appear widely patent, and there is no lesion in the left lung apex or left upper ribs.  Incidental note is made of a roughly spherical 6 mm calcific density on the right side of the neck  in the submandibular space, likely representing a submandibular gland calculus.  IMPRESSION:  No acute or focal cervical spine abnormality.  Probable 6 mm right submandibular gland calculus.  Original Report Authenticated By: Elsie Stain, M.D.   Dg Shoulder Left  04/01/2012  *RADIOLOGY REPORT*  Clinical Data:  Left neck pain extends into the shoulder for 4-5 days.  LEFT SHOULDER - 2+ VIEW  Comparison:  None.  Findings:  There is no evidence of fracture or dislocation.  There is no evidence of arthropathy or other focal bone abnormality. Soft tissues are unremarkable.  IMPRESSION: Negative.  Original Report Authenticated By: Elsie Stain, M.D.     1. Muscle strain       MDM    Vital signs are stable. Patient is nontoxic appearing. Tenderness to palpation of the left cervical paraspinal muscles and left trapezius muscles that also radiates around the border of the scapula. Pain is reproduced with abduction of the left arm.  Distal sensation is intact, grip strength is strong and equal bilaterally, radial pulse is brisk, capillary refill is less than 2 seconds. Pain is likely  musculoskeletal in origin. I also explained to her that this could possibly be a cervical radiculopathy.  Patient agrees to followup with her primary care physician next week if her symptoms are not improving or to return to ER if her symptoms worsen.  Patient / Family / Caregiver understand and agree with initial ED impression and plan with expectations set for ED visit. Pt stable in ED with no significant deterioration in condition. Pt feels improved after observation and/or treatment in ED.        Lynell Kussman L. Ryleigh Esqueda, [place] 04/01/12 1720

## 2012-04-01 NOTE — ED Notes (Signed)
C/o pain to left shoulder and left neck, unable to turn head to left due to pain, states pain has been unbearable for last 3 days, attempted to see PCP for last two days and was told that office was not seeing anymore walk-ins and pt was not able to make an appt til next week

## 2012-04-02 NOTE — ED Provider Notes (Signed)
Medical screening examination/treatment/procedure(s) were performed by non-physician practitioner and as supervising physician I was immediately available for consultation/collaboration.   Uldine Fuster L Hattye Siegfried, MD 04/02/12 0926 

## 2012-04-22 ENCOUNTER — Other Ambulatory Visit (HOSPITAL_COMMUNITY): Payer: Self-pay | Admitting: Neurosurgery

## 2012-04-22 DIAGNOSIS — M542 Cervicalgia: Secondary | ICD-10-CM

## 2012-04-26 ENCOUNTER — Ambulatory Visit (HOSPITAL_COMMUNITY)
Admission: RE | Admit: 2012-04-26 | Discharge: 2012-04-26 | Disposition: A | Discharge status: Home / Self Care | Payer: BC Managed Care – PPO | Source: Ambulatory Visit | Attending: Neurosurgery | Admitting: Neurosurgery

## 2012-04-26 DIAGNOSIS — M542 Cervicalgia: Secondary | ICD-10-CM | POA: Insufficient documentation

## 2012-05-03 ENCOUNTER — Other Ambulatory Visit (HOSPITAL_COMMUNITY): Payer: Self-pay | Admitting: Neurosurgery

## 2012-05-03 DIAGNOSIS — M545 Low back pain: Secondary | ICD-10-CM

## 2012-05-05 ENCOUNTER — Ambulatory Visit (HOSPITAL_COMMUNITY)
Admission: RE | Admit: 2012-05-05 | Discharge: 2012-05-05 | Disposition: A | Discharge status: Home / Self Care | Payer: BC Managed Care – PPO | Source: Ambulatory Visit | Attending: Neurosurgery | Admitting: Neurosurgery

## 2012-05-05 DIAGNOSIS — M5126 Other intervertebral disc displacement, lumbar region: Secondary | ICD-10-CM | POA: Insufficient documentation

## 2012-05-05 DIAGNOSIS — M545 Low back pain, unspecified: Secondary | ICD-10-CM | POA: Insufficient documentation

## 2012-05-05 LAB — BUN: BUN: 11 mg/dL (ref 6–23)

## 2012-05-05 LAB — CREATININE, SERUM: GFR calc Af Amer: 86 mL/min — ABNORMAL LOW (ref >90)

## 2012-05-05 MED ORDER — GADOBENATE DIMEGLUMINE 529 MG/ML IV SOLN
20.0000 mL | Freq: Once | INTRAVENOUS | Status: AC | PRN
  Administered 2012-05-05: 20 mL via INTRAVENOUS

## 2012-05-09 ENCOUNTER — Other Ambulatory Visit (HOSPITAL_COMMUNITY): Payer: Self-pay | Admitting: Internal Medicine

## 2012-05-09 ENCOUNTER — Ambulatory Visit (HOSPITAL_COMMUNITY)
Admission: RE | Admit: 2012-05-09 | Discharge: 2012-05-09 | Disposition: A | Discharge status: Home / Self Care | Payer: BC Managed Care – PPO | Source: Ambulatory Visit | Attending: Internal Medicine | Admitting: Internal Medicine

## 2012-05-09 DIAGNOSIS — M7989 Other specified soft tissue disorders: Secondary | ICD-10-CM

## 2012-05-09 DIAGNOSIS — M79609 Pain in unspecified limb: Secondary | ICD-10-CM | POA: Insufficient documentation

## 2012-09-06 ENCOUNTER — Other Ambulatory Visit (HOSPITAL_COMMUNITY): Payer: Self-pay | Admitting: Family Medicine

## 2012-09-06 DIAGNOSIS — IMO0001 Reserved for inherently not codable concepts without codable children: Secondary | ICD-10-CM

## 2012-09-19 ENCOUNTER — Ambulatory Visit (HOSPITAL_COMMUNITY)
Admission: RE | Admit: 2012-09-19 | Discharge: 2012-09-19 | Disposition: A | Discharge status: Home / Self Care | Payer: BC Managed Care – PPO | Source: Ambulatory Visit | Attending: Family Medicine | Admitting: Family Medicine

## 2012-09-19 DIAGNOSIS — IMO0001 Reserved for inherently not codable concepts without codable children: Secondary | ICD-10-CM

## 2012-09-19 DIAGNOSIS — Z1231 Encounter for screening mammogram for malignant neoplasm of breast: Secondary | ICD-10-CM | POA: Insufficient documentation

## 2012-10-07 ENCOUNTER — Emergency Department (HOSPITAL_COMMUNITY)
Admission: EM | Admit: 2012-10-07 | Discharge: 2012-10-07 | Disposition: A | Discharge status: Home / Self Care | Payer: BC Managed Care – PPO | Attending: Emergency Medicine | Admitting: Emergency Medicine

## 2012-10-07 ENCOUNTER — Encounter (HOSPITAL_COMMUNITY): Payer: Self-pay | Admitting: *Deleted

## 2012-10-07 DIAGNOSIS — G8929 Other chronic pain: Secondary | ICD-10-CM | POA: Insufficient documentation

## 2012-10-07 DIAGNOSIS — I10 Essential (primary) hypertension: Secondary | ICD-10-CM | POA: Insufficient documentation

## 2012-10-07 DIAGNOSIS — M545 Low back pain, unspecified: Secondary | ICD-10-CM | POA: Insufficient documentation

## 2012-10-07 DIAGNOSIS — N390 Urinary tract infection, site not specified: Secondary | ICD-10-CM

## 2012-10-07 DIAGNOSIS — Z760 Encounter for issue of repeat prescription: Secondary | ICD-10-CM | POA: Insufficient documentation

## 2012-10-07 LAB — URINALYSIS, ROUTINE W REFLEX MICROSCOPIC
Glucose, UA: NEGATIVE mg/dL
Ketones, ur: NEGATIVE mg/dL
Nitrite: NEGATIVE
Specific Gravity, Urine: 1.02 (ref 1.005–1.030)
pH: 7 (ref 5.0–8.0)

## 2012-10-07 LAB — URINE MICROSCOPIC-ADD ON

## 2012-10-07 MED ORDER — HYDROCODONE-ACETAMINOPHEN 5-325 MG PO TABS: 1.0000 | ORAL_TABLET | Freq: Four times a day (QID) | ORAL | Status: AC | PRN

## 2012-10-07 MED ORDER — SULFAMETHOXAZOLE-TMP DS 800-160 MG PO TABS
1.0000 | ORAL_TABLET | Freq: Once | ORAL | Status: AC
  Administered 2012-10-07: 1 via ORAL
  Filled 2012-10-07: qty 1

## 2012-10-07 MED ORDER — SULFAMETHOXAZOLE-TRIMETHOPRIM 800-160 MG PO TABS: 1.0000 | ORAL_TABLET | Freq: Two times a day (BID) | ORAL | Status: DC

## 2012-10-07 NOTE — ED Provider Notes (Signed)
History     CSN: [id number]  Arrival date & time 10/07/12  1509   First MD Initiated Contact with Patient 10/07/12 1608      Chief Complaint  Patient presents with  . Hematuria    (Consider location/radiation/quality/duration/timing/severity/associated sxs/prior treatment) HPI Comments: Pt frequently has low back pain but she attributes that to her lumbar DDD.  Recent laminectomy by dr. Franky Macho.  Denies fever.  No other complaints.  Patient is a 51 y.o. female presenting with hematuria. The history is provided by the patient. No language interpreter was used.  Hematuria This is a new problem. Episode onset: 2-3 days ago. The problem is unchanged. She describes the hematuria as gross hematuria. She reports no clotting in her urine stream. Her pain is at a severity of 7/10. Irritative symptoms include urgency. Irritative symptoms do not include frequency. Obstructive symptoms do not include incomplete emptying. Associated symptoms include chills, dysuria and hesitancy. Pertinent negatives include no abdominal pain, fever, flank pain, nausea or vomiting. There is no history of kidney stones or recent infection.    Past Medical History  Diagnosis Date  . Hypertension     Past Surgical History  Procedure Date  . Back surgery   . Wrist surgery   . Tubal ligation     History reviewed. No pertinent family history.  History  Substance Use Topics  . Smoking status: Never Smoker   . Smokeless tobacco: Not on file  . Alcohol Use: No    OB History    Grav Para Term Preterm Abortions TAB SAB Ect Mult Living                  Review of Systems  Constitutional: Positive for chills. Negative for fever.  Gastrointestinal: Negative for nausea, vomiting and abdominal pain.  Genitourinary: Positive for dysuria, hesitancy, urgency and hematuria. Negative for frequency, flank pain, vaginal bleeding and incomplete emptying.  Musculoskeletal: Positive for back pain.  All other systems  reviewed and are negative.    Allergies  Review of patient's allergies indicates no known allergies.  Home Medications   Current Outpatient Rx  Name Route Sig Dispense Refill  . VITAMIN D 1000 UNITS PO TABS Oral Take 1,000 Units by mouth daily.      . OMEGA-3 FATTY ACIDS 1000 MG PO CAPS Oral Take 1 g by mouth 2 (two) times daily.    Marland Kitchen LISINOPRIL-HYDROCHLOROTHIAZIDE 20-12.5 MG PO TABS Oral Take 1 tablet by mouth daily.      Marland Kitchen VITAMIN C 500 MG PO TABS Oral Take 500 mg by mouth daily.      . ALBUTEROL SULFATE HFA 108 (90 BASE) MCG/ACT IN AERS Inhalation Inhale 2 puffs into the lungs every 6 (six) hours as needed. For shortness of breath     . HYDROCODONE-ACETAMINOPHEN 5-325 MG PO TABS Oral Take 1 tablet by mouth every 6 (six) hours as needed for pain. 20 tablet 0  . IBUPROFEN 200 MG PO TABS Oral Take 400 mg by mouth every 6 (six) hours as needed. For headaches     . SULFAMETHOXAZOLE-TRIMETHOPRIM 800-160 MG PO TABS Oral Take 1 tablet by mouth every 12 (twelve) hours. 10 tablet 0    BP 121/71  Pulse 66  Temp 98.8 F (37.1 C) (Oral)  Resp 20  Ht 5\' 4"  (1.626 m)  Wt 302 lb (136.986 kg)  BMI 51.84 kg/m2  SpO2 100%  LMP 09/30/2012  Physical Exam  Nursing note and vitals reviewed. Constitutional: She is oriented to person,  place, and time. She appears well-developed and well-nourished. No distress.  HENT:  Head: Normocephalic and atraumatic.  Eyes: EOM are normal.  Neck: Normal range of motion.  Cardiovascular: Normal rate, regular rhythm and normal heart sounds.   Pulmonary/Chest: Effort normal and breath sounds normal.  Abdominal: Soft. She exhibits no distension. There is no tenderness.  Genitourinary: No vaginal discharge found.  Musculoskeletal: She exhibits no tenderness.       Lumbar back: She exhibits decreased range of motion and pain. She exhibits no tenderness, no bony tenderness, no swelling, no deformity, no spasm and normal pulse.       Back:  Neurological: She is  alert and oriented to person, place, and time.  Skin: Skin is warm and dry.  Psychiatric: She has a normal mood and affect. Judgment normal.    ED Course  Procedures (including critical care time)  Labs Reviewed  URINALYSIS, ROUTINE W REFLEX MICROSCOPIC - Abnormal; Notable for the following:    Color, Urine STRAW (*)     APPearance CLOUDY (*)     Hgb urine dipstick LARGE (*)     Protein, ur 30 (*)     Leukocytes, UA LARGE (*)     All other components within normal limits  URINE MICROSCOPIC-ADD ON - Abnormal; Notable for the following:    Squamous Epithelial / LPF MANY (*)     Bacteria, UA FEW (*)     All other components within normal limits  URINE CULTURE   No results found.   1. UTI (urinary tract infection)   2. Prescription refill   3. Chronic low back pain       MDM  Urine cx pending. rx-bactrim DS, 10 rx-hydrocodone, 20 Drink plenty of fluids.   F/u with PCP Call dr. Franky Macho if you  Need more pain meds.        Evalina Field, [place] 10/07/12 [phone]

## 2012-10-07 NOTE — ED Notes (Signed)
Hematuria,dysuria, and back pain,  No fever.

## 2012-10-08 NOTE — ED Provider Notes (Signed)
Medical screening examination/treatment/procedure(s) were performed by non-physician practitioner and as supervising physician I was immediately available for consultation/collaboration. Shaterica Mcclatchy, MD, FACEP   Eriq Hufford L Shirleen Mcfaul, MD 10/08/12 0914 

## 2012-10-10 LAB — URINE CULTURE

## 2012-10-11 NOTE — ED Notes (Signed)
+   urine Patient treated with septra-sensitive to same-chart appended per protocol MD. 

## 2013-07-07 ENCOUNTER — Other Ambulatory Visit: Payer: Self-pay

## 2013-07-07 ENCOUNTER — Telehealth: Payer: Self-pay

## 2013-07-07 DIAGNOSIS — Z1211 Encounter for screening for malignant neoplasm of colon: Secondary | ICD-10-CM

## 2013-07-07 NOTE — Telephone Encounter (Signed)
Pt called to be set up for her tcs. She can be reached at [phone] or [phone]

## 2013-07-07 NOTE — Telephone Encounter (Signed)
Tried to call pt. LMOM to call and that we leave at noon today.

## 2013-07-11 ENCOUNTER — Encounter (HOSPITAL_COMMUNITY): Payer: Self-pay | Admitting: Pharmacy Technician

## 2013-07-12 ENCOUNTER — Encounter (INDEPENDENT_AMBULATORY_CARE_PROVIDER_SITE_OTHER): Payer: Self-pay

## 2013-07-12 NOTE — Telephone Encounter (Signed)
Gastroenterology Pre-Procedure Review  Request Date: 07/07/2013   Requesting Physician: Dr. Regino Schultze  PATIENT REVIEW QUESTIONS: The patient responded to the following health history questions as indicated:    1. Diabetes Melitis: no 2. Joint replacements in the past 12 months: no 3. Major health problems in the past 3 months: no 4. Has an artificial valve or MVP: no 5. Has a defibrillator: no 6. Has been advised in past to take antibiotics in advance of a procedure like teeth cleaning: no    MEDICATIONS & ALLERGIES:    Patient reports the following regarding taking any blood thinners:   Plavix? no Aspirin? YES Coumadin? no  Patient confirms/reports the following medications:  Current Outpatient Prescriptions  Medication Sig Dispense Refill  . aspirin 325 MG tablet Take 325 mg by mouth daily.      . cholecalciferol (VITAMIN D) 1000 UNITS tablet Take 1,000 Units by mouth daily.        . fish oil-omega-3 fatty acids 1000 MG capsule Take 1 g by mouth 2 (two) times daily.      Marland Kitchen ibuprofen (ADVIL,MOTRIN) 200 MG tablet Take 400 mg by mouth every 6 (six) hours as needed. For headaches       . lisinopril-hydrochlorothiazide (PRINZIDE,ZESTORETIC) 20-12.5 MG per tablet Take 1 tablet by mouth daily.        Marland Kitchen albuterol (PROVENTIL HFA;VENTOLIN HFA) 108 (90 BASE) MCG/ACT inhaler Inhale 2 puffs into the lungs every 6 (six) hours as needed. For shortness of breath       . vitamin C (ASCORBIC ACID) 500 MG tablet Take 500 mg by mouth daily.         No current facility-administered medications for this visit.    Patient confirms/reports the following allergies:  No Known Allergies  No orders of the defined types were placed in this encounter.    AUTHORIZATION INFORMATION Primary Insurance:   ID #: Group #:  Pre-Cert / Auth required:  Pre-Cert / Auth #:   Secondary Insurance:   ID #:   Group #:  Pre-Cert / Auth required: Pre-Cert / Auth #:   SCHEDULE INFORMATION: Procedure has been  scheduled as follows:  Date: 07/21/2013    Time: 10:15 AM  Location: [facility] Short Stay  This Gastroenterology Pre-Precedure Review Form is being routed to the following provider(s): Jonette Eva, MD

## 2013-07-12 NOTE — Telephone Encounter (Signed)
PREPOPIK-DRINK WATER TO KEEP URINE LIGHT YELLOW.  PT SHOULD DROP OFF RX 3 DAYS PRIOR TO PROCEDURE.  

## 2013-07-14 MED ORDER — SOD PICOSULFATE-MAG OX-CIT ACD 10-3.5-12 MG-GM-GM PO PACK: 1.0000 | PACK | Freq: Once | ORAL | Status: DC

## 2013-07-14 NOTE — Telephone Encounter (Signed)
Rx sent to the pharmacy and instructions mailed to pt.  

## 2013-07-14 NOTE — Addendum Note (Signed)
Addended by: Lavena Bullion on: 07/14/2013 08:01 AM   Modules accepted: Orders

## 2013-07-17 ENCOUNTER — Telehealth: Payer: Self-pay

## 2013-07-17 ENCOUNTER — Ambulatory Visit (INDEPENDENT_AMBULATORY_CARE_PROVIDER_SITE_OTHER): Payer: BC Managed Care – PPO | Admitting: Obstetrics and Gynecology

## 2013-07-17 ENCOUNTER — Other Ambulatory Visit (HOSPITAL_COMMUNITY)
Admission: RE | Admit: 2013-07-17 | Discharge: 2013-07-17 | Disposition: A | Discharge status: Home / Self Care | Payer: BC Managed Care – PPO | Source: Ambulatory Visit | Attending: Obstetrics and Gynecology | Admitting: Obstetrics and Gynecology

## 2013-07-17 ENCOUNTER — Encounter: Payer: Self-pay | Admitting: Obstetrics and Gynecology

## 2013-07-17 VITALS — BP 122/78 | Ht 64.25 in | Wt 308.6 lb

## 2013-07-17 DIAGNOSIS — Z1151 Encounter for screening for human papillomavirus (HPV): Secondary | ICD-10-CM | POA: Insufficient documentation

## 2013-07-17 DIAGNOSIS — D259 Leiomyoma of uterus, unspecified: Secondary | ICD-10-CM

## 2013-07-17 DIAGNOSIS — Z01419 Encounter for gynecological examination (general) (routine) without abnormal findings: Secondary | ICD-10-CM

## 2013-07-17 NOTE — Telephone Encounter (Signed)
Fax from [employer]. Requested cheaper prep. I called and gave a verbal for Trilyte and spoke to [place] the pharmacist and she is aware that I will fax instructions to them for the pt to put in the bag.   I called pt and left Vm that I was sending new prep and new instructions. Faxing new instructions to [phone].

## 2013-07-17 NOTE — Progress Notes (Signed)
Patient ID: [name], female   DOB: [birth year], 52 y.o.   MRN: [id number] Pt here today for annual exam  Assessment:  Normal Gyn Exam   Plan:  1. pap smear done, next pap due 5  2. return annually or prn  Subjective:  [name] is a 52 y.o. female No obstetric history on file. who presents for annual exam.  The patient has complaints today of none   The following portions of the patient's history were reviewed and updated as appropriate: allergies, current medications, past family history, past medical history, past social history, past surgical history and problem list.  Review of Systems Pertinent items are noted in HPI.  Objective:  BP 122/78  Ht 5' 4.25" (1.632 m)  Wt 308 lb 9.6 oz (139.98 kg)  BMI 52.56 kg/m2  LMP 06/17/2013  BMI: Body mass index is 52.56 kg/(m^2). General Appearance: Alert, appropriate appearance for age. No acute distress HEENT: Grossly normal Neck / Thyroid:  Cardiovascular: RRR; normal S1, S2, no murmur Lungs: CTA bilaterally Back: No CVAT Breast Exam: No dimpling, nipple retraction or discharge. No masses or nodes. and No masses or nodes.No dimpling, nipple retraction or discharge. Gastrointestinal: Soft, non-tender, no masses or organomegaly Pelvic Exam: Cervix: normal appearance and good support Uterus: anteverted, enlarged and 14 week size Rectovaginal: Has Hemoccult and colonoscopy this week Lymphatic Exam: Non-palpable nodes in neck, clavicular, axillary, or inguinal regions Skin: no rash or abnormalities Neurologic: Normal gait and speech, no tremor  Psychiatric: Alert and oriented, appropriate affect.  Urinalysis:Not done  [name]. MD Pgr [phone] 12:28 PM    Assessment:  Normal Gyn Exam   Plan:  3. pap smear done, next pap due 5 years 4. return annually or prn

## 2013-07-17 NOTE — Patient Instructions (Signed)
Count the calories. 3500 calories is equal to 1 pounds fat Get an APP for your computer/smartphone Measure it, add it up  Daily Be honest, even if it hurts. 12 weeks = new habit

## 2013-07-21 ENCOUNTER — Encounter (HOSPITAL_COMMUNITY): Payer: Self-pay | Admitting: *Deleted

## 2013-07-21 ENCOUNTER — Ambulatory Visit (HOSPITAL_COMMUNITY)
Admission: RE | Admit: 2013-07-21 | Discharge: 2013-07-21 | Disposition: A | Discharge status: Home / Self Care | Payer: BC Managed Care – PPO | Source: Ambulatory Visit | Attending: Gastroenterology | Admitting: Gastroenterology

## 2013-07-21 ENCOUNTER — Encounter (HOSPITAL_COMMUNITY)
Admission: RE | Disposition: A | Discharge status: Home / Self Care | Payer: Self-pay | Source: Ambulatory Visit | Attending: Gastroenterology

## 2013-07-21 DIAGNOSIS — K648 Other hemorrhoids: Secondary | ICD-10-CM

## 2013-07-21 DIAGNOSIS — Z1211 Encounter for screening for malignant neoplasm of colon: Secondary | ICD-10-CM | POA: Insufficient documentation

## 2013-07-21 DIAGNOSIS — K5730 Diverticulosis of large intestine without perforation or abscess without bleeding: Secondary | ICD-10-CM

## 2013-07-21 DIAGNOSIS — I10 Essential (primary) hypertension: Secondary | ICD-10-CM | POA: Insufficient documentation

## 2013-07-21 HISTORY — PX: COLONOSCOPY: SHX5424

## 2013-07-21 SURGERY — COLONOSCOPY
Anesthesia: Moderate Sedation

## 2013-07-21 MED ORDER — STERILE WATER FOR IRRIGATION IR SOLN
Status: DC | PRN
  Administered 2013-07-21: 10:00:00

## 2013-07-21 MED ORDER — MIDAZOLAM HCL 5 MG/5ML IJ SOLN
INTRAMUSCULAR | Status: AC
  Filled 2013-07-21: qty 10

## 2013-07-21 MED ORDER — MEPERIDINE HCL 100 MG/ML IJ SOLN
INTRAMUSCULAR | Status: DC | PRN
  Administered 2013-07-21 (×3): 25 mg via INTRAVENOUS

## 2013-07-21 MED ORDER — SODIUM CHLORIDE 0.9 % IV SOLN
INTRAVENOUS | Status: DC
  Administered 2013-07-21: 1000 mL via INTRAVENOUS

## 2013-07-21 MED ORDER — MIDAZOLAM HCL 5 MG/5ML IJ SOLN
INTRAMUSCULAR | Status: DC | PRN
  Administered 2013-07-21 (×2): 2 mg via INTRAVENOUS
  Administered 2013-07-21: 1 mg via INTRAVENOUS

## 2013-07-21 MED ORDER — MEPERIDINE HCL 100 MG/ML IJ SOLN
INTRAMUSCULAR | Status: AC
  Filled 2013-07-21: qty 1

## 2013-07-21 NOTE — H&P (Addendum)
  Primary Care Physician:  Kirk Ruths, MD Primary Gastroenterologist:  Dr. Darrick Penna  Pre-Procedure History & Physical: HPI:  [name] is a 52 y.o. female here for COLON CANCER SCREENING.  Past Medical History  Diagnosis Date  . Hypertension   . Obesity   . Asthma     Past Surgical History  Procedure Laterality Date  . Back surgery    . Wrist surgery    . Tubal ligation      Prior to Admission medications   Medication Sig Start Date End Date Taking? Authorizing Provider  albuterol (PROVENTIL HFA;VENTOLIN HFA) 108 (90 BASE) MCG/ACT inhaler Inhale 2 puffs into the lungs every 6 (six) hours as needed. For shortness of breath    Yes Historical Provider, MD  aspirin 325 MG tablet Take 325 mg by mouth daily.   Yes Historical Provider, MD  cholecalciferol (VITAMIN D) 1000 UNITS tablet Take 1,000 Units by mouth daily.     Yes Historical Provider, MD  fish oil-omega-3 fatty acids 1000 MG capsule Take 1 g by mouth 2 (two) times daily.   Yes Historical Provider, MD  ibuprofen (ADVIL,MOTRIN) 200 MG tablet Take 400 mg by mouth every 6 (six) hours as needed. For headaches    Yes Historical Provider, MD  lisinopril-hydrochlorothiazide (PRINZIDE,ZESTORETIC) 20-12.5 MG per tablet Take 1 tablet by mouth daily.     Yes Historical Provider, MD  Sod Picosulfate-Mag Ox-Cit Acd 10-3.5-12 MG-GM-GM PACK Take 1 kit by mouth once. 07/14/13  Yes [place], MD  vitamin C (ASCORBIC ACID) 500 MG tablet Take 500 mg by mouth daily.     Yes Historical Provider, MD    Allergies as of 07/07/2013  . (No Known Allergies)    Family History  Problem Relation Age of Onset  . Hypertension Mother     History   Social History  . Marital Status: Married    Spouse Name: N/A    Number of Children: N/A  . Years of Education: N/A   Occupational History  . Not on file.   Social History Main Topics  . Smoking status: Never Smoker   . Smokeless tobacco: Not on file  . Alcohol Use: No  . Drug  Use: No  . Sexually Active: Yes   Other Topics Concern  . Not on file   Social History Narrative  . No narrative on file    Review of Systems: See HPI, otherwise negative ROS   Physical Exam: BP 108/80  Pulse 76  Temp(Src) 98.6 F (37 C) (Oral)  Resp 20  SpO2 96%  LMP 06/05/2013 General:   Alert,  pleasant and cooperative in NAD Head:  Normocephalic and atraumatic. Neck:  Supple; Lungs:  Clear throughout to auscultation.    Heart:  Regular rate and rhythm. Abdomen:  Soft, nontender and nondistended. Normal bowel sounds, without guarding, and without rebound.   Neurologic:  Alert and  oriented x4;  grossly normal neurologically.  Impression/Plan:     SCREENING  Plan:  1. TCS TODAY

## 2013-07-21 NOTE — Op Note (Signed)
[facility] [address] [place], [address]   COLONOSCOPY PROCEDURE REPORT  PATIENT: [name], [name]  MR#: [id number] BIRTHDATE: July 18, 1961 , 52  yrs. old GENDER: Female ENDOSCOPIST: Jonette Eva, MD REFERRED [id number] Regino Schultze, M.D. PROCEDURE DATE:  07/21/2013 PROCEDURE:   ILEOColonoscopy, screening INDICATIONS:Average risk patient for colon cancer. MEDICATIONS: Demerol 75 mg IV and Versed 5 mg IV  DESCRIPTION OF PROCEDURE:    Physical exam was performed.  Informed consent was obtained from the patient after explaining the benefits, risks, and alternatives to procedure.  The patient was connected to monitor and placed in left lateral position. Continuous oxygen was provided by nasal cannula and IV medicine administered through an indwelling cannula.  After administration of sedation and rectal exam, the patients rectum was intubated and the EC-[id number] ([id number])  colonoscope was advanced under direct visualization to the ileum.  The scope was removed slowly by carefully examining the color, texture, anatomy, and integrity mucosa on the way out.  The patient was recovered in endoscopy and discharged home in satisfactory condition.    COLON FINDINGS: The mucosa appeared normal in the terminal ileum.  , Mild diverticulosis was noted in the descending colon and sigmoid colon.  , Small internal hemorrhoids were found.  , and The colon was otherwise normal.  There was no inflammation, polyps or cancers unless previously stated.  PREP QUALITY: good.   CECAL W/D TIME: 11 minutes  COMPLICATIONS: None  ENDOSCOPIC IMPRESSION: 1.   Normal mucosa in the terminal ileum 2.   Mild diverticulosis was noted in the descending colon and sigmoid colon 3.   Small internal hemorrhoids 4.   The colon was otherwise normal   RECOMMENDATIONS: CONTINUE YOUR WEIGHT LOSS PROGRAM. Follow a HIGH FIBER/LOW FAT DIET.  AVOID ITEMS THAT CAUSE BLOATING.  USE PREPARATION H FOUR  TIMES A DAY FOR 7 DAYS IF YOU HAVE RECTAL BLEEDING, PAIN, PRESSURE , OR ITCHING. Next colonoscopy in 10 years.       _______________________________ Rosalie DoctorJonette Eva, MD 07/21/2013 11:21 AM

## 2013-07-25 ENCOUNTER — Encounter (HOSPITAL_COMMUNITY): Payer: Self-pay | Admitting: Gastroenterology

## 2013-09-09 ENCOUNTER — Emergency Department (HOSPITAL_COMMUNITY)
Admission: EM | Admit: 2013-09-09 | Discharge: 2013-09-09 | Disposition: A | Discharge status: Home / Self Care | Payer: BC Managed Care – PPO | Attending: Emergency Medicine | Admitting: Emergency Medicine

## 2013-09-09 ENCOUNTER — Encounter (HOSPITAL_COMMUNITY): Payer: Self-pay | Admitting: Emergency Medicine

## 2013-09-09 DIAGNOSIS — Z9851 Tubal ligation status: Secondary | ICD-10-CM | POA: Insufficient documentation

## 2013-09-09 DIAGNOSIS — M25569 Pain in unspecified knee: Secondary | ICD-10-CM | POA: Insufficient documentation

## 2013-09-09 DIAGNOSIS — E669 Obesity, unspecified: Secondary | ICD-10-CM | POA: Insufficient documentation

## 2013-09-09 DIAGNOSIS — M25559 Pain in unspecified hip: Secondary | ICD-10-CM | POA: Insufficient documentation

## 2013-09-09 DIAGNOSIS — I10 Essential (primary) hypertension: Secondary | ICD-10-CM | POA: Insufficient documentation

## 2013-09-09 DIAGNOSIS — G8929 Other chronic pain: Secondary | ICD-10-CM | POA: Insufficient documentation

## 2013-09-09 DIAGNOSIS — M5430 Sciatica, unspecified side: Secondary | ICD-10-CM | POA: Insufficient documentation

## 2013-09-09 DIAGNOSIS — M5432 Sciatica, left side: Secondary | ICD-10-CM

## 2013-09-09 DIAGNOSIS — Z7982 Long term (current) use of aspirin: Secondary | ICD-10-CM | POA: Insufficient documentation

## 2013-09-09 DIAGNOSIS — Z9889 Other specified postprocedural states: Secondary | ICD-10-CM | POA: Insufficient documentation

## 2013-09-09 DIAGNOSIS — J45909 Unspecified asthma, uncomplicated: Secondary | ICD-10-CM | POA: Insufficient documentation

## 2013-09-09 DIAGNOSIS — Z79899 Other long term (current) drug therapy: Secondary | ICD-10-CM | POA: Insufficient documentation

## 2013-09-09 MED ORDER — HYDROCODONE-ACETAMINOPHEN 5-325 MG PO TABS: 1.0000 | ORAL_TABLET | ORAL | Status: DC | PRN

## 2013-09-09 MED ORDER — CYCLOBENZAPRINE HCL 10 MG PO TABS: 10.0000 mg | ORAL_TABLET | Freq: Two times a day (BID) | ORAL | Status: DC | PRN

## 2013-09-09 NOTE — ED Notes (Addendum)
Pt c/o left hip pain and right knee pain.  Pt not aware of any recent injuries.  Pt states that the pain has occurred for 3 weeks approximately but severe pain began 2 weeks ago.  Pt states the the left hip "grinds and goes all the way down to my leg into my foot, my knee just feels like it is swole." "Hurts worse when I walks, and hip hurts worse when she sits for awhile"  Right knee seems somewhat swollen although it is hard to tell for sure due to excess adipose tissue.  Patient feels pain that occurs on the medial, lateral, and posterior aspect of right knee when palpated.   Pt states she has been taking motrin for pain.  She took about 400mg  of motrin about 0830 this am.

## 2013-09-09 NOTE — ED Provider Notes (Signed)
CSN: [id number]     Arrival date & time 09/09/13  1145 History   First MD Initiated Contact with Patient 09/09/13 1200     Chief Complaint  Patient presents with  . Hip Pain  . Knee Pain   (Consider location/radiation/quality/duration/timing/severity/associated sxs/prior Treatment) Patient is a 52 y.o. female presenting with hip pain and knee pain. The history is provided by the patient.  Hip Pain This is a chronic problem. The current episode started 1 to 4 weeks ago. The problem occurs constantly. The problem has been gradually worsening. Pertinent negatives include no abdominal pain, chest pain, chills, fever, nausea, neck pain, rash or vomiting.  Knee Pain Associated symptoms: no fever and no neck pain    [name] is a 52 y.o. female who presents to the ED with left hip pain that started several weeks ago but has been bad for the past 2 weeks. She also complains of pain in the the right knee for the past few weeks. She has a history of chronic hip and knee pain and has been evaluated in the past for both. This pain is similar to the pain previously. She has had MRI of the left hip and surgery on her right knee to remove a cyst. The chronic pain has been for over a year and was told it may be coming from her lower back. She has had CT scans and surgery on the lower back but continues to have problems with the hip pain. She is followed by Dr. Regino Schultze but went to the office today and they were full and she could not get in today.   Past Medical History  Diagnosis Date  . Hypertension   . Obesity   . Asthma    Past Surgical History  Procedure Laterality Date  . Back surgery    . Wrist surgery    . Tubal ligation    . Colonoscopy N/A 07/21/2013    Procedure: COLONOSCOPY;  Surgeon: West Bali, MD;  [facility]: [facility];  Service: Endoscopy;  Laterality: N/A;  10:15 AM   Family History  Problem Relation Age of Onset  . Hypertension Mother    History  Substance Use  Topics  . Smoking status: Never Smoker   . Smokeless tobacco: Not on file  . Alcohol Use: No   OB History   Grav Para Term Preterm Abortions TAB SAB Ect Mult Living                 Review of Systems  Constitutional: Negative for fever and chills.  HENT: Negative for neck pain.   Respiratory: Negative for chest tightness and shortness of breath.   Cardiovascular: Negative for chest pain.  Gastrointestinal: Negative for nausea, vomiting and abdominal pain.  Genitourinary: Negative for dysuria, urgency and frequency.  Musculoskeletal:       Left hip pain, right knee pain  Skin: Negative for rash.  Neurological: Negative for dizziness and syncope.  Psychiatric/Behavioral: The patient is not nervous/anxious.     Allergies  Review of patient's allergies indicates no known allergies.  Home Medications   Current Outpatient Rx  Name  Route  Sig  Dispense  Refill  . aspirin 325 MG tablet   Oral   Take 325 mg by mouth daily.         . cholecalciferol (VITAMIN D) 1000 UNITS tablet   Oral   Take 1,000 Units by mouth daily.           Marland Kitchen  fish oil-omega-3 fatty acids 1000 MG capsule   Oral   Take 1 g by mouth 2 (two) times daily.         Marland Kitchen lisinopril-hydrochlorothiazide (PRINZIDE,ZESTORETIC) 20-12.5 MG per tablet   Oral   Take 1 tablet by mouth daily.           Marland Kitchen albuterol (PROVENTIL HFA;VENTOLIN HFA) 108 (90 BASE) MCG/ACT inhaler   Inhalation   Inhale 2 puffs into the lungs every 6 (six) hours as needed. For shortness of breath          . ibuprofen (ADVIL,MOTRIN) 200 MG tablet   Oral   Take 400 mg by mouth every 6 (six) hours as needed. For headaches          . vitamin C (ASCORBIC ACID) 500 MG tablet   Oral   Take 500 mg by mouth daily.            BP 123/67  Pulse 54  Temp(Src) 98.5 F (36.9 C) (Oral)  Resp 18  Ht 5\' 4"  (1.626 m)  Wt 300 lb (136.079 kg)  BMI 51.47 kg/m2  SpO2 98% Physical Exam  Nursing note and vitals reviewed. Constitutional: She  is oriented to person, place, and time. No distress.  Morbidly obese  HENT:  Head: Normocephalic and atraumatic.  Eyes: Conjunctivae and EOM are normal.  Neck: Normal range of motion. Neck supple.  Cardiovascular: Normal rate, regular rhythm and normal heart sounds.   Pulmonary/Chest: Effort normal and breath sounds normal.  Musculoskeletal:       Left hip: She exhibits tenderness. She exhibits normal range of motion, normal strength, no swelling and no deformity.       Right knee: She exhibits normal range of motion, no erythema and normal alignment. Tenderness found.       Legs: No calf tenderness  Neurological: She is alert and oriented to person, place, and time. No cranial nerve deficit.  Skin: Skin is warm and dry.  Psychiatric: She has a normal mood and affect. Her behavior is normal.    ED Course  Procedures  MDM  52 y.o. female with sciatic pain left and right knee pain which is chronic. Patient is to follow up with Dr. Regino Schultze. Discussed with patient will treat her acute sciatic pain today but she will need to have her PCP treat her chronic pain. Patient voices understanding. Patient stable for discharge home without any immediate complications.     Medication List    TAKE these medications       cyclobenzaprine 10 MG tablet  Commonly known as:  FLEXERIL  Take 1 tablet (10 mg total) by mouth 2 (two) times daily as needed for muscle spasms.     HYDROcodone-acetaminophen 5-325 MG per tablet  Commonly known as:  NORCO/VICODIN  Take 1 tablet by mouth every 4 (four) hours as needed.      ASK your doctor about these medications       albuterol 108 (90 BASE) MCG/ACT inhaler  Commonly known as:  PROVENTIL HFA;VENTOLIN HFA  Inhale 2 puffs into the lungs every 6 (six) hours as needed. For shortness of breath     aspirin 325 MG tablet  Take 325 mg by mouth daily.     cholecalciferol 1000 UNITS tablet  Commonly known as:  VITAMIN D  Take 1,000 Units by mouth daily.       fish oil-omega-3 fatty acids 1000 MG capsule  Take 1 g by mouth 2 (two) times daily.  ibuprofen 200 MG tablet  Commonly known as:  ADVIL,MOTRIN  Take 400 mg by mouth every 6 (six) hours as needed. For headaches     lisinopril-hydrochlorothiazide 20-12.5 MG per tablet  Commonly known as:  PRINZIDE,ZESTORETIC  Take 1 tablet by mouth daily.     vitamin C 500 MG tablet  Commonly known as:  ASCORBIC ACID  Take 500 mg by mouth daily.           [address] [place], [place] 09/09/13 [phone]

## 2013-09-09 NOTE — ED Notes (Signed)
NP at bedside.

## 2013-09-10 NOTE — ED Provider Notes (Signed)
Medical screening examination/treatment/procedure(s) were performed by non-physician practitioner and as supervising physician I was immediately available for consultation/collaboration.    Brighton Delio R Fabyan Loughmiller, MD 09/10/13 0718 

## 2014-01-29 ENCOUNTER — Other Ambulatory Visit (HOSPITAL_COMMUNITY): Payer: Self-pay | Admitting: Family Medicine

## 2014-01-29 DIAGNOSIS — Z139 Encounter for screening, unspecified: Secondary | ICD-10-CM

## 2014-02-12 ENCOUNTER — Ambulatory Visit (HOSPITAL_COMMUNITY): Payer: BC Managed Care – PPO

## 2014-02-19 ENCOUNTER — Ambulatory Visit (HOSPITAL_COMMUNITY): Payer: BC Managed Care – PPO

## 2014-02-26 ENCOUNTER — Ambulatory Visit (HOSPITAL_COMMUNITY)
Admission: RE | Admit: 2014-02-26 | Discharge: 2014-02-26 | Disposition: A | Discharge status: Home / Self Care | Payer: BC Managed Care – PPO | Source: Ambulatory Visit | Attending: Family Medicine | Admitting: Family Medicine

## 2014-02-26 DIAGNOSIS — Z139 Encounter for screening, unspecified: Secondary | ICD-10-CM

## 2014-02-26 DIAGNOSIS — Z1231 Encounter for screening mammogram for malignant neoplasm of breast: Secondary | ICD-10-CM | POA: Insufficient documentation

## 2014-03-21 ENCOUNTER — Encounter (INDEPENDENT_AMBULATORY_CARE_PROVIDER_SITE_OTHER): Payer: Self-pay

## 2014-03-21 ENCOUNTER — Encounter: Payer: Self-pay | Admitting: Obstetrics and Gynecology

## 2014-03-21 ENCOUNTER — Ambulatory Visit (INDEPENDENT_AMBULATORY_CARE_PROVIDER_SITE_OTHER): Payer: BC Managed Care – PPO | Admitting: Obstetrics and Gynecology

## 2014-03-21 VITALS — BP 120/80 | Ht 64.0 in

## 2014-03-21 DIAGNOSIS — D259 Leiomyoma of uterus, unspecified: Secondary | ICD-10-CM

## 2014-03-21 DIAGNOSIS — N920 Excessive and frequent menstruation with regular cycle: Secondary | ICD-10-CM

## 2014-03-21 LAB — CBC
HEMATOCRIT: 36.1 % (ref 36.0–46.0)
HEMOGLOBIN: 12.1 g/dL (ref 12.0–15.0)
MCH: 26.4 pg (ref 26.0–34.0)
MCHC: 33.5 g/dL (ref 30.0–36.0)
MCV: 78.6 fL (ref 78.0–100.0)
Platelets: 270 10*3/uL (ref 150–400)
RBC: 4.59 MIL/uL (ref 3.87–5.11)
RDW: 14.6 % (ref 11.5–15.5)
WBC: 7.7 10*3/uL (ref 4.0–10.5)

## 2014-03-21 NOTE — Progress Notes (Signed)
This chart was scribed by Jenne Campus, Medical Scribe, for Dr. Mallory Shirk on 03/21/14 at 3:40 PM. This chart was reviewed by Dr. Mallory Shirk and is accurate.   [facility] Visit  Patient name: [name] MRN [id number]  Date of birth: May 25, 1961  CC & HPI:  [name] is a 53 y.o. female presenting today for severe vaginal bleeding described as watery for the past 6 days. + intermittent LLQ cramping pain. Had to change clothes twice and the blood was running down her legs 2 nights in a row. Pain is gone now. Reports spotting today, still bleeding. Having menses every 2-2.5 months and reports this was more severe.   Pt reports actively trying to lose weight by walking and doing Shakeology.   ROS:  + vaginal bleeding, improving  + intermittent LLQ pain  Pertinent History Reviewed:  Medical & Surgical Hx:  Reviewed: Significant for fibroid uterus  Medications: Reviewed & Updated - see associated section Social History: Reviewed -  reports that she has never smoked. She does not have any smokeless tobacco history on file.  Objective Findings:  Vitals: BP 120/80  Ht 5\' 4"  (1.626 m)  LMP 03/15/2014 Chaperone present for exam which was performed with pt's permission Physical Examination: Not done   Assessment & Plan:  A: 1. Menorrhagia 2. Uterine Fibroids  P: 1. FSH,cbc  2. Pelvic U/S

## 2014-03-22 LAB — FOLLICLE STIMULATING HORMONE: FSH: 4.9 m[IU]/mL

## 2014-03-22 LAB — TSH: TSH: 1.072 u[IU]/mL (ref 0.350–4.500)

## 2014-03-28 ENCOUNTER — Ambulatory Visit (INDEPENDENT_AMBULATORY_CARE_PROVIDER_SITE_OTHER): Payer: BC Managed Care – PPO

## 2014-03-28 ENCOUNTER — Ambulatory Visit (INDEPENDENT_AMBULATORY_CARE_PROVIDER_SITE_OTHER): Payer: BC Managed Care – PPO | Admitting: Obstetrics and Gynecology

## 2014-03-28 ENCOUNTER — Other Ambulatory Visit: Payer: Self-pay | Admitting: Obstetrics and Gynecology

## 2014-03-28 ENCOUNTER — Encounter: Payer: Self-pay | Admitting: Obstetrics and Gynecology

## 2014-03-28 VITALS — BP 120/76 | Ht 64.0 in | Wt 306.0 lb

## 2014-03-28 DIAGNOSIS — D219 Benign neoplasm of connective and other soft tissue, unspecified: Secondary | ICD-10-CM

## 2014-03-28 DIAGNOSIS — D259 Leiomyoma of uterus, unspecified: Secondary | ICD-10-CM

## 2014-03-28 DIAGNOSIS — N949 Unspecified condition associated with female genital organs and menstrual cycle: Secondary | ICD-10-CM

## 2014-03-28 DIAGNOSIS — N83209 Unspecified ovarian cyst, unspecified side: Secondary | ICD-10-CM

## 2014-03-28 DIAGNOSIS — N920 Excessive and frequent menstruation with regular cycle: Secondary | ICD-10-CM

## 2014-03-28 MED ORDER — MEDROXYPROGESTERONE ACETATE 10 MG PO TABS: 10.0000 mg | ORAL_TABLET | Freq: Every day | ORAL | Status: DC

## 2014-03-28 NOTE — Patient Instructions (Signed)
Endometrial Biopsy Endometrial biopsy is a procedure in which a tissue sample is taken from inside the uterus. The tissue sample is then looked at under a microscope to see if the tissue is normal or abnormal. The endometrium is the lining of the uterus. This procedure helps determine where you are in your menstrual cycle and how hormone levels are affecting the lining of the uterus. This procedure may also be used to evaluate uterine bleeding or to diagnose endometrial cancer, tuberculosis, polyps, or inflammatory conditions.  LET YOUR HEALTH CARE PROVIDER KNOW ABOUT:  Any allergies you have.  All medicines you are taking, including vitamins, herbs, eye drops, creams, and over-the-counter medicines.  Previous problems you or members of your family have had with the use of anesthetics.  Any blood disorders you have.  Previous surgeries you have had.  Medical conditions you have.  Possibility of pregnancy. RISKS AND COMPLICATIONS Generally, this is a safe procedure. However, as with any procedure, complications can occur. Possible complications include:  Bleeding.  Pelvic infection.  Puncture of the uterine wall with the biopsy device (rare). BEFORE THE PROCEDURE   Keep a record of your menstrual cycles as directed by your health care provider. You may need to schedule your procedure for a specific time in your cycle.  You may want to bring a sanitary pad to wear home after the procedure.  Arrange for someone to drive you home after the procedure if you will be given a medicine to help you relax (sedative). PROCEDURE   You may be given a sedative to relax you.  You will lie on an exam table with your feet and legs supported as in a pelvic exam.  Your health care provider will insert an instrument (speculum) into your vagina to see your cervix.  Your cervix will be cleansed with an antiseptic solution. A medicine (local anesthetic) will be used to numb the cervix.  A forceps  instrument (tenaculum) will be used to hold your cervix steady for the biopsy.  A thin, rodlike instrument (uterine sound) will be inserted through your cervix to determine the length of your uterus and the location where the biopsy sample will be removed.  A thin, flexible tube (catheter) will be inserted through your cervix and into the uterus. The catheter is used to collect the biopsy sample from your endometrial tissue.  The catheter and speculum will then be removed, and the tissue sample will be sent to a lab for examination. AFTER THE PROCEDURE  You will rest in a recovery area until you are ready to go home.  You may have mild cramping and a small amount of vaginal bleeding for a few days after the procedure. This is normal.  Make sure you find out how to get your test results. Document Released: 04/16/2005 Document Revised: 08/16/2013 Document Reviewed: 05/31/2013 [employer] Patient Information 2014 [employer].  

## 2014-03-28 NOTE — Progress Notes (Signed)
This chart was scribed by Jenne Campus, Medical Scribe, for Dr. Mallory Shirk on 03/28/14 at 1:56 PM. This chart was reviewed by Dr. Mallory Shirk and is accurate.   [facility] Visit  Patient name: [name] MRN [id number]  Date of birth: Aug 25, 1961  CC & HPI:  [name] is a 53 y.o. female presenting today for f/u for U/S to evaluate a fibroid uterus. LMP was 3 weeks long and ended 3 days ago.  Pelvis U/S done on 03/28/14 Enlarged uterus with multiple fibroids distorting endometrial cavity, ENDOM-21.23mm, Rt ovary appears WNL, LT ovary with 2.6 x 2.1cm simple cyst noted, no free fluid noted within pelvis  ROS:  + chronic LLQ abdominal pain No other complaints  Pertinent History Reviewed:  Medical & Surgical Hx:  Reviewed: Significant for uterine fibroids Medications: Reviewed & Updated - see associated section Social History: Reviewed -  reports that she has never smoked. She has never used smokeless tobacco.  Objective Findings:  Vitals: BP 120/76  Ht 5\' 4"  (1.626 m)  Wt 306 lb (138.801 kg)  BMI 52.50 kg/m2  LMP 03/15/2014 Chaperone present for exam and procedure which were both performed with pt's permission. Physical Examination: General appearance - alert, well appearing, and in no distress, oriented to person, place, and time and overweight Mental status - alert, oriented to person, place, and time, normal mood, behavior, speech, dress, motor activity, and thought processes Pelvic - VULVA: normal appearing vulva with no masses, tenderness or lesions, VAGINA: normal appearing vagina with normal color and discharge, no lesions, CERVIX: normal appearing cervix without discharge or lesions  Endometrial Biopsy: Patient given informed consent, signed copy in the chart, time out was performed. Time out taken. The patient was placed in the lithotomy position and the cervix brought into view with sterile speculum.  Portio of cervix cleansed x 2 with betadine  swabs.  A tenaculum was placed in the anterior lip of the cervix. The uterus was sounded for depth of 8 cm. Milex uterine Explora 3 mm wasasttempted to be introduced to into the uterus,  but an endometrial sample was NOT obtained due to blocked uterine canal from fibroids.sounded only to 5 cm, and uterine sound could not be passed either All equipment was removed and accounted for. The patient tolerated the procedure well.   Patient given post procedure instructions.  Followup:    Assessment & Plan:  A: 1. Uterine Fibroids, 14 wks 2. Failed endometrial biopsy  P: 1. 14 days of 10 mg provera  2. Repeat endometrial biopsy after menses 21 days from now.

## 2014-04-16 ENCOUNTER — Ambulatory Visit (INDEPENDENT_AMBULATORY_CARE_PROVIDER_SITE_OTHER): Payer: BC Managed Care – PPO | Admitting: Obstetrics and Gynecology

## 2014-04-16 ENCOUNTER — Encounter: Payer: Self-pay | Admitting: Obstetrics and Gynecology

## 2014-04-16 ENCOUNTER — Other Ambulatory Visit: Payer: Self-pay | Admitting: Obstetrics and Gynecology

## 2014-04-16 VITALS — BP 128/84 | Ht 64.0 in | Wt 316.6 lb

## 2014-04-16 DIAGNOSIS — D259 Leiomyoma of uterus, unspecified: Secondary | ICD-10-CM

## 2014-04-16 DIAGNOSIS — Z3202 Encounter for pregnancy test, result negative: Secondary | ICD-10-CM

## 2014-04-16 LAB — POCT URINE PREGNANCY: PREG TEST UR: NEGATIVE

## 2014-04-16 NOTE — Progress Notes (Signed)
This chart was scribed by Sonuclong term progesteronm Posey Pronto, Medical Scribe, for Dr. Mallory Shirk on 04/16/14 at 3:38 PM. This chart was reviewed by Dr. Mallory Shirk and is accurate.   Endometrial Biopsy: Patient given informed consent, signed copy in the chart, time out was performed. Time out taken. The patient was placed in the lithotomy position and the cervix brought into view with sterile speculum.  Portion of cervix cleansed x 2 with betadine swabs. Local anesthesia of 1% lidocaine without epi used, 10 cc. A tenaculum was placed in the anterior lip of the cervix. The uterus was sounded for depth of 9 cm,. Milex uterine Explora 3 mm was introduced to into the uterus, suction created,  and an endometrial sample was obtained. All equipment was removed and accounted for.   The patient tolerated the procedure well.    Patient given post procedure instructions.  Followup: 2 wk visit discuss long term progesterone tx vs hysterectomy

## 2014-04-16 NOTE — Patient Instructions (Signed)
Endometrial Biopsy, Care After Refer to this sheet in the next few weeks. These instructions provide you with information on caring for yourself after your procedure. Your health care provider may also give you more specific instructions. Your treatment has been planned according to current medical practices, but problems sometimes occur. Call your health care provider if you have any problems or questions after your procedure. WHAT TO EXPECT AFTER THE PROCEDURE After your procedure, it is typical to have the following:  You may have mild cramping and a small amount of vaginal bleeding for a few days after the procedure. This is normal. HOME CARE INSTRUCTIONS  Only take over-the-counter or prescription medicine as directed by your health care provider.  Do not douche, use tampons, or have sexual intercourse until your health care provider approves.  Follow your health care provider's instructions regarding any activity restrictions, such as strenuous exercise or heavy lifting. SEEK MEDICAL CARE IF:  You have heavy bleeding or bleeding longer than 2 days after the procedure.  You have bad smelling drainage from your vagina.  You have a fever and chills.  Youhave severe lower stomach (abdominal) pain. SEEK IMMEDIATE MEDICAL CARE IF:  You have severe cramps in your stomach or back.  You pass large blood clots.  Your bleeding increases.  You become weak or lightheaded, or you pass out. Document Released: 10/04/2013 Document Reviewed: 05/31/2013 Exit[employer] Patient Information 2014 [employer].  

## 2014-04-16 NOTE — Addendum Note (Signed)
Addended by: Traci Sermon A on: 04/16/2014 04:00 PM   Modules accepted: Orders

## 2014-04-17 ENCOUNTER — Other Ambulatory Visit: Payer: BC Managed Care – PPO | Admitting: Obstetrics and Gynecology

## 2014-04-30 ENCOUNTER — Ambulatory Visit (INDEPENDENT_AMBULATORY_CARE_PROVIDER_SITE_OTHER): Payer: BC Managed Care – PPO | Admitting: Obstetrics and Gynecology

## 2014-04-30 ENCOUNTER — Encounter: Payer: Self-pay | Admitting: Obstetrics and Gynecology

## 2014-04-30 VITALS — BP 132/86 | Ht 64.0 in | Wt 316.0 lb

## 2014-04-30 DIAGNOSIS — D259 Leiomyoma of uterus, unspecified: Secondary | ICD-10-CM

## 2014-04-30 NOTE — Progress Notes (Addendum)
This chart was scribed by Ludger Nutting, Medical Scribe, for Dr. Mallory Shirk on 04/30/14 at 4:34 PM. This chart was reviewed by Dr. Mallory Shirk for accuracy.   [facility] Visit  Patient name: [name] MRN [id number]  Date of birth: 1961-05-17  CC & HPI:  [name] is a 53 y.o. female presenting today for follow up after endometrial biopsy on 04/16/14. She was diagnosed with uterine fibroids at prior visit. She continues to have pelvic pain described as pressure with associated vaginal discharge. She also reports occasional dyspareunia.    Reported to patient that biopsy is benign with proliferative endometrium.    ROS:  Negative except noted above.   Pertinent History Reviewed:  Medical & Surgical Hx:  Reviewed: Significant for  Past Medical History  Diagnosis Date  . Hypertension   . Obesity   . Asthma   . Sickle cell trait    Past Surgical History  Procedure Laterality Date  . Back surgery    . Wrist surgery    . Tubal ligation    . Colonoscopy N/A 07/21/2013    Procedure: COLONOSCOPY;  Surgeon: Danie Binder, MD;  Location[facility];  Service: Endoscopy;  Laterality: N/A;  10:15 AM    Medications: Reviewed & Updated - see associated section Social History: Reviewed -  reports that she has never smoked. She has never used smokeless tobacco.  Objective Findings:  Vitals: BP 132/86  Ht 5\' 4"  (1.626 m)  Wt 316 lb (143.337 kg)  BMI 54.21 kg/m2  LMP 04/09/2014  Physical Examination: General appearance - alert, well appearing, and in no distress and oriented to person, place, and time Pelvic - examination not indicated   Assessment & Plan:   A: 1. Uterine fibroids, 14 week size  P: 1. Patient to discuss surgical options with husband and will schedule pre-op in 2 weeks.

## 2014-04-30 NOTE — Patient Instructions (Signed)
Hysterectomy Information  A hysterectomy is a surgery in which your uterus is removed. This surgery may be done to treat various medical problems. After the surgery, you will no longer have menstrual periods. The surgery will also make you unable to become pregnant (sterile). The fallopian tubes and ovaries can be removed (bilateral salpingo-oophorectomy) during this surgery as well.  REASONS FOR A HYSTERECTOMY  Persistent, abnormal bleeding.  Lasting (chronic) pelvic pain or infection.  The lining of the uterus (endometrium) starts growing outside the uterus (endometriosis).  The endometrium starts growing in the muscle of the uterus (adenomyosis).  The uterus falls down into the vagina (pelvic organ prolapse).  Noncancerous growths in the uterus (uterine fibroids) that cause symptoms.  Precancerous cells.  Cervical cancer or uterine cancer. TYPES OF HYSTERECTOMIES  Supracervical hysterectomy In this type, the top part of the uterus is removed, but not the cervix.  Total hysterectomy The uterus and cervix are removed.  Radical hysterectomy The uterus, the cervix, and the fibrous tissue that holds the uterus in place in the pelvis (parametrium) are removed. WAYS A HYSTERECTOMY CAN BE PERFORMED  Abdominal hysterectomy A large surgical cut (incision) is made in the abdomen. The uterus is removed through this incision.  Vaginal hysterectomy An incision is made in the vagina. The uterus is removed through this incision. There are no abdominal incisions.  Conventional laparoscopic hysterectomy Three or four small incisions are made in the abdomen. A thin, lighted tube with a camera (laparoscope) is inserted into one of the incisions. Other tools are put through the other incisions. The uterus is cut into small pieces. The small pieces are removed through the incisions, or they are removed through the vagina.  Laparoscopically assisted vaginal hysterectomy (LAVH) Three or four small  incisions are made in the abdomen. Part of the surgery is performed laparoscopically and part vaginally. The uterus is removed through the vagina.  Robot-assisted laparoscopic hysterectomy A laparoscope and other tools are inserted into 3 or 4 small incisions in the abdomen. A computer-controlled device is used to give the surgeon a 3D image and to help control the surgical instruments. This allows for more precise movements of surgical instruments. The uterus is cut into small pieces and removed through the incisions or removed through the vagina. RISKS AND COMPLICATIONS  Possible complications associated with this procedure include:  Bleeding and risk of blood transfusion. Tell your health care provider if you do not want to receive any blood products.  Blood clots in the legs or lung.  Infection.  Injury to surrounding organs.  Problems or side effects related to anesthesia.  Conversion to an abdominal hysterectomy from one of the other techniques. WHAT TO EXPECT AFTER A HYSTERECTOMY  You will be given pain medicine.  You will need to have someone with you for the first 3 5 days after you go home.  You will need to follow up with your surgeon in 2 4 weeks after surgery to evaluate your progress.  You may have early menopause symptoms such as hot flashes, night sweats, and insomnia.  If you had a hysterectomy for a problem that was not cancer or not a condition that could lead to cancer, then you no longer need Pap tests. However, even if you no longer need a Pap test, a regular exam is a good idea to make sure no other problems are starting. Document Released: 06/09/2001 Document Revised: 10/04/2013 Document Reviewed: 08/21/2013 [facility] Patient Information 2014 [place].

## 2014-05-14 ENCOUNTER — Ambulatory Visit: Payer: BC Managed Care – PPO | Admitting: Obstetrics and Gynecology

## 2014-05-23 ENCOUNTER — Encounter: Payer: Self-pay | Admitting: Obstetrics and Gynecology

## 2014-05-23 ENCOUNTER — Ambulatory Visit (INDEPENDENT_AMBULATORY_CARE_PROVIDER_SITE_OTHER): Payer: BC Managed Care – PPO | Admitting: Obstetrics and Gynecology

## 2014-05-23 VITALS — BP 140/80 | Ht 64.0 in | Wt 317.0 lb

## 2014-05-23 DIAGNOSIS — Z01818 Encounter for other preprocedural examination: Secondary | ICD-10-CM

## 2014-05-23 DIAGNOSIS — D259 Leiomyoma of uterus, unspecified: Secondary | ICD-10-CM

## 2014-05-23 NOTE — Patient Instructions (Signed)
We will call you once the tissue reports have been located. We will talk by Friday

## 2014-05-25 LAB — GC/CHLAMYDIA PROBE AMP
CT PROBE, AMP APTIMA: NEGATIVE
GC PROBE AMP APTIMA: NEGATIVE

## 2014-06-06 ENCOUNTER — Telehealth: Payer: Self-pay | Admitting: *Deleted

## 2014-06-06 NOTE — Telephone Encounter (Signed)
Spoke with Pt and advised the pt that she needed to have another pre op exam. I advised the pt that I had given the papers she gave me to Dr. Glo Herring but her surgery had not been scheduled yet and she would have to have another exam. The pt was given an appointment for this Friday at 11:15 with Dr. Glo Herring to have her pre-op and schedule her surgery.

## 2014-06-08 ENCOUNTER — Ambulatory Visit (INDEPENDENT_AMBULATORY_CARE_PROVIDER_SITE_OTHER): Payer: BC Managed Care – PPO | Admitting: Obstetrics and Gynecology

## 2014-06-08 ENCOUNTER — Encounter: Payer: Self-pay | Admitting: Obstetrics and Gynecology

## 2014-06-08 VITALS — BP 118/80 | Ht 64.0 in | Wt 320.6 lb

## 2014-06-08 DIAGNOSIS — M549 Dorsalgia, unspecified: Secondary | ICD-10-CM

## 2014-06-08 DIAGNOSIS — D259 Leiomyoma of uterus, unspecified: Secondary | ICD-10-CM

## 2014-06-08 NOTE — Progress Notes (Signed)
   [place] [facility] Visit  Patient name: [name] MRN [id number]  Date of birth: [birth year]  This chart was scribed by Erling Conte, Medical Scribe, for Dr. Mallory Shirk on 06/08/14 at 12:11 PM. This chart was reviewed by Dr. Mallory Shirk for accuracy.     CC & HPI:  Cyndy Braver is a 53 y.o. female presenting today for a pre-op visit for a hysterectomy procedure, salpingectomy and oophorectomy. She states she is having cramping pelvic pain, believes it is due to her uterine fibroids. She also said she has been gaining weight despite exercise and watching what she eats  ROS:  + discussion of hysterectomy, salpingectomy, and oophorectomy procedure + cramping pelvic pain +weight gain No other complaints  Pertinent History Reviewed:   Reviewed: Significant for wt gain, exercising Medical                                   Surgical Hx:    Medications: Reviewed & Updated - see associated section Social History: Reviewed -  reports that she has never smoked. She has never used smokeless tobacco.  Objective Findings:  Vitals: BP 118/80  Ht 5\' 4"  (1.626 m)  Wt 320 lb 9.6 oz (145.423 kg)  BMI 55.00 kg/m2  LMP 05/26/2014  Physical Examination: General appearance - alert, well appearing, and in no distress Eyes - pupils equal and reactive, extraocular eye movements intact Neck - supple, no significant adenopathy Chest - clear to auscultation, no wheezes, rales or rhonchi, symmetric air entry Heart - normal rate, regular rhythm, normal S1, S2, no murmurs, rubs, clicks or gallops Abdomen - soft, nontender, nondistended, no masses or organomegaly Physical Examination: Pelvic - normal external genitalia, vulva, vagina, cervix, uterus and adnexa, UTERUS: enlarged 14 weeks see prior visit      Assessment & Plan:   A 1. Uterine fibroids, symptomatic  P 1. TAH BSO 07/10/14

## 2014-06-08 NOTE — Progress Notes (Signed)
Surgery scheduled 

## 2014-06-08 NOTE — Patient Instructions (Signed)
Hysterectomy Information  A hysterectomy is a surgery in which your uterus is removed. This surgery may be done to treat various medical problems. After the surgery, you will no longer have menstrual periods. The surgery will also make you unable to become pregnant (sterile). The fallopian tubes and ovaries can be removed (bilateral salpingo-oophorectomy) during this surgery as well.  REASONS FOR A HYSTERECTOMY  Persistent, abnormal bleeding.  Lasting (chronic) pelvic pain or infection.  The lining of the uterus (endometrium) starts growing outside the uterus (endometriosis).  The endometrium starts growing in the muscle of the uterus (adenomyosis).  The uterus falls down into the vagina (pelvic organ prolapse).  Noncancerous growths in the uterus (uterine fibroids) that cause symptoms.  Precancerous cells.  Cervical cancer or uterine cancer. TYPES OF HYSTERECTOMIES  Supracervical hysterectomy In this type, the top part of the uterus is removed, but not the cervix.  Total hysterectomy The uterus and cervix are removed.  Radical hysterectomy The uterus, the cervix, and the fibrous tissue that holds the uterus in place in the pelvis (parametrium) are removed. WAYS A HYSTERECTOMY CAN BE PERFORMED  Abdominal hysterectomy A large surgical cut (incision) is made in the abdomen. The uterus is removed through this incision.  Vaginal hysterectomy An incision is made in the vagina. The uterus is removed through this incision. There are no abdominal incisions.  Conventional laparoscopic hysterectomy Three or four small incisions are made in the abdomen. A thin, lighted tube with a camera (laparoscope) is inserted into one of the incisions. Other tools are put through the other incisions. The uterus is cut into small pieces. The small pieces are removed through the incisions, or they are removed through the vagina.  Laparoscopically assisted vaginal hysterectomy (LAVH) Three or four small  incisions are made in the abdomen. Part of the surgery is performed laparoscopically and part vaginally. The uterus is removed through the vagina.  Robot-assisted laparoscopic hysterectomy A laparoscope and other tools are inserted into 3 or 4 small incisions in the abdomen. A computer-controlled device is used to give the surgeon a 3D image and to help control the surgical instruments. This allows for more precise movements of surgical instruments. The uterus is cut into small pieces and removed through the incisions or removed through the vagina. RISKS AND COMPLICATIONS  Possible complications associated with this procedure include:  Bleeding and risk of blood transfusion. Tell your health care provider if you do not want to receive any blood products.  Blood clots in the legs or lung.  Infection.  Injury to surrounding organs.  Problems or side effects related to anesthesia.  Conversion to an abdominal hysterectomy from one of the other techniques. WHAT TO EXPECT AFTER A HYSTERECTOMY  You will be given pain medicine.  You will need to have someone with you for the first 3 5 days after you go home.  You will need to follow up with your surgeon in 2 4 weeks after surgery to evaluate your progress.  You may have early menopause symptoms such as hot flashes, night sweats, and insomnia.  If you had a hysterectomy for a problem that was not cancer or not a condition that could lead to cancer, then you no longer need Pap tests. However, even if you no longer need a Pap test, a regular exam is a good idea to make sure no other problems are starting. Document Released: 06/09/2001 Document Revised: 10/04/2013 Document Reviewed: 08/21/2013 [facility] Patient Information 2014 [place]. Bilateral Salpingo-Oophorectomy Bilateral salpingo-oophorectomy is  the surgical removal of both fallopian tubes and both ovaries. The ovaries are small organs that produce eggs in women. The fallopian tubes  transport the egg from the ovary to the womb (uterus). Usually, when this surgery is done, the uterus was previously removed. A bilateral salpingo-oophorectomy may be done to treat cancer or to reduce the risk of cancer in women who are at high risk. Removing both fallopian tubes and both ovaries will make you unable to become pregnant (sterile). It will also put you into menopause so that you will no longer have menstrual periods and may have menopausal symptoms such as hot flashes, night sweats, and mood changes. It will not affect your sex drive. LET [facility] CARE PROVIDER KNOW ABOUT:  Any allergies you have.  All medicines you are taking, including vitamins, herbs, eye drops, creams, and over-the-counter medicines.  Previous problems you or members of your family have had with the use of anesthetics.  Any blood disorders you have.  Previous surgeries you have had.  Medical conditions you have. RISKS AND COMPLICATIONS Generally, this is a safe procedure. However, as with any procedure, complications can occur. Possible complications include:  Injury to surrounding organs.  Bleeding.  Infection.  Blood clots in the legs or lungs.  Problems related to anesthesia. BEFORE THE PROCEDURE  Ask your health care provider about changing or stopping your regular medicines. You may need to stop taking certain medicines, such as aspirin or blood thinners, at least 1 week before the surgery.  Do not eat or drink anything for at least 8 hours before the surgery.  If you smoke, do not smoke for at least 2 weeks before the surgery.  Make plans to have someone drive you home after the procedure or after your hospital stay. Also arrange for someone to help you with activities during recovery. PROCEDURE   You will be given medicine to help you relax before the procedure (sedative). You will then be given medicine to make you sleep through the procedure (general anesthetic). These medicines  will be given through an IV access tube that is put into one of your veins.  Once you are asleep, your lower abdomen will be shaved and cleaned. A thin, flexible tube (catheter) will be placed in your bladder.  The surgeon may use a laparoscopic, robotic, or open technique for this surgery:  In the laparoscopic technique, the surgery is done through two small cuts (incisions) in the abdomen. A thin, lighted tube with a tiny camera on the end (laparoscope) is inserted into one of the incisions. The tools needed for the procedure are put through the other incision.  A robotic technique may be chosen to perform complex surgery in a small space. In the robotic technique, small incisions will be made. A camera and surgical instruments are passed through the incisions. Surgical instruments will be controlled with the help of a robotic arm.  In the open technique, the surgery is done through one large incision in the abdomen.  Using any of these techniques, the surgeon removes the fallopian tubes and ovaries. The blood vessels will be clamped and tied.  The surgeon then uses staples or stitches to close the incision or incisions. AFTER THE PROCEDURE  You will be taken to a recovery area where you will be monitored for 1 to 3 hours. Your blood pressure, pulse, and temperature will be checked often. You will remain in the recovery area until you are stable and waking up.  If the  laparoscopic technique was used, you may be allowed to go home after several hours. You may have some shoulder pain after the laparoscopic procedure. This is normal and usually goes away in a day or two.  If the open technique was used, you will be admitted to the hospital for a couple of days.  You will be given pain medicine as needed.  The IV access tube and catheter will be removed before you are discharged. Document Released: 12/14/2005 Document Revised: 08/16/2013 Document Reviewed: 06/07/2013 [facility] Patient  Information 2014 [place].

## 2014-06-09 NOTE — Progress Notes (Signed)
Patient ID: [name], female   DOB: 1961/01/23, 53 y.o.   MRN: [id number] [name]   Patient name: [name] MRN [id number] Date of birth: 1961-08-08  CC & HPI:   [name] is a 53 y.o. female presenting today for follow up after endometrial biopsy on 04/16/14, followed by discusson with family, deciding on Abdominal hysterectomy with removal of tubes,ovaries, and cervix. She was diagnosed with uterine fibroids at prior visit. She continues to have pelvic pain described as pressure with associated vaginal discharge. She also reports occasional dyspareunia.  Reported to patient that biopsy is benign with proliferative endometrium.  ROS:   Negative except noted above.  Pertinent History Reviewed:   Medical & Surgical Hx: Reviewed: Significant for  Past Medical History   Diagnosis  Date   .  Hypertension    .  Obesity    .  Asthma    .  Sickle cell trait     Past Surgical History   Procedure  Laterality  Date   .  Back surgery     .  Wrist surgery     .  Tubal ligation     .  Colonoscopy  N/A  07/21/2013     Procedure: COLONOSCOPY; Surgeon: Danie Binder, MD; Location: [facility]; Service: Endoscopy; Laterality: N/A; 10:15 AM   Medications: Reviewed & Updated - see associated section  Social History: Reviewed - reports that she has never smoked. She has never used smokeless tobacco.  Pelvis U/S done on 03/28/14  Enlarged uterus with multiple fibroids distorting endometrial cavity, ENDOM-21.47mm, Rt ovary appears WNL, LT ovary with 2.6 x 2.1cm simple cyst noted, no free fluid noted within pelvis     Objective Findings:   Vitals: BP 132/86  Ht 5\' 4"  (1.626 m)  Wt 316 lb (143.337 kg)  BMI 54.21 kg/m2  LMP 04/09/2014  Physical Examination: General appearance - alert, well appearing, and in no distress and oriented to person, place, and time  Physical Examination: General appearance - alert, well appearing, and in no distress, oriented to person,  place, and time and overweight Mental status - alert, oriented to person, place, and time, normal mood, behavior, speech, dress, motor activity, and thought processes Eyes - pupils equal and reactive, extraocular eye movements intact Neck - supple, no significant adenopathy Chest - clear to auscultation, no wheezes, rales or rhonchi, symmetric air entry Heart - normal rate and regular rhythm Abdomen - soft, nontender, nondistended, no masses or organomegaly Thick abd wall with panniculus, uncertain if  Transverse incision will be adequate Pelvic - VULVA: normal appearing vulva with no masses, tenderness or lesions, VAGINA: normal appearing vagina with normal color and discharge, no lesions, CERVIX: normal appearing cervix without discharge or lesions, UTERUS: mass uterus fills pelvis, enlarged to 12-14  week's size, ADNEXA: normal adnexa in size, nontender and no masses, limited by obesity, exam limited by body habitus Extremities - peripheral pulses normal, no pedal edema, no clubbing or cyanosis, Homan's sign negative bilaterally      Pelvic - as above Assessment & Plan:   A:  1. Uterine fibroids, 14 week size  Plan ; will work to schedule hysterectomy. Pt has decided that hyst/Bso with removal of cervix is desired.

## 2014-06-11 NOTE — Progress Notes (Signed)
Surgery scheduled 

## 2014-06-13 ENCOUNTER — Encounter: Payer: BC Managed Care – PPO | Admitting: Obstetrics and Gynecology

## 2014-07-04 ENCOUNTER — Encounter (HOSPITAL_COMMUNITY): Payer: Self-pay | Admitting: Pharmacy Technician

## 2014-07-04 NOTE — Patient Instructions (Signed)
[name]  07/04/2014   Your procedure is scheduled on:   07/10/2014  Report to [facility] at  63  AM.  Call this number if you have problems the morning of surgery: [phone]   Remember:   Do not eat food or drink liquids after midnight.   Take these medicines the morning of surgery with A SIP OF WATER:  Hydrocodone, lisinopril, flexaril. Take your albuterol before you come and bring it with you.   Do not wear jewelry, make-up or nail polish.  Do not wear lotions, powders, or perfumes.   Do not shave 48 hours prior to surgery. Men may shave face and neck.  Do not bring valuables to the hospital.  [facility] is not responsible for any belongings or valuables.               Contacts, dentures or bridgework may not be worn into surgery.  Leave suitcase in the car. After surgery it may be brought to your room.  For patients admitted to the hospital, discharge time is determined by your treatment team.               Patients discharged the day of surgery will not be allowed to drive home.  Name and phone number of your driver: family  Special Instructions: Shower using CHG 2 nights before surgery and the night before surgery.  If you shower the day of surgery use CHG.  Use special wash - you have one bottle of CHG for all showers.  You should use approximately 1/3 of the bottle for each shower.   Please read over the following fact sheets that you were given: Pain Booklet, Coughing and Deep Breathing, Surgical Site Infection Prevention, Anesthesia Post-op Instructions and Care and Recovery After Surgery Bilateral Salpingo-Oophorectomy Bilateral salpingo-oophorectomy is the surgical removal of both fallopian tubes and both ovaries. The ovaries are small organs that produce eggs in women. The fallopian tubes transport the egg from the ovary to the womb (uterus). Usually, when this surgery is done, the uterus was previously removed. A bilateral salpingo-oophorectomy may be  done to treat cancer or to reduce the risk of cancer in women who are at high risk. Removing both fallopian tubes and both ovaries will make you unable to become pregnant (sterile). It will also put you into menopause so that you will no longer have menstrual periods and may have menopausal symptoms such as hot flashes, night sweats, and mood changes. It will not affect your sex drive. LET [facility] CARE PROVIDER KNOW ABOUT:  Any allergies you have.  All medicines you are taking, including vitamins, herbs, eye drops, creams, and over-the-counter medicines.  Previous problems you or members of your family have had with the use of anesthetics.  Any blood disorders you have.  Previous surgeries you have had.  Medical conditions you have. RISKS AND COMPLICATIONS Generally, this is a safe procedure. However, as with any procedure, complications can occur. Possible complications include:  Injury to surrounding organs.  Bleeding.  Infection.  Blood clots in the legs or lungs.  Problems related to anesthesia. BEFORE THE PROCEDURE  Ask your health care provider about changing or stopping your regular medicines. You may need to stop taking certain medicines, such as aspirin or blood thinners, at least 1 week before the surgery.  Do not eat or drink anything for at least 8 hours before the surgery.  If you smoke, do not smoke for at  least 2 weeks before the surgery.  Make plans to have someone drive you home after the procedure or after your hospital stay. Also arrange for someone to help you with activities during recovery. PROCEDURE   You will be given medicine to help you relax before the procedure (sedative). You will then be given medicine to make you sleep through the procedure (general anesthetic). These medicines will be given through an IV access tube that is put into one of your veins.  Once you are asleep, your lower abdomen will be shaved and cleaned. A thin, flexible tube  (catheter) will be placed in your bladder.  The surgeon may use a laparoscopic, robotic, or open technique for this surgery:  In the laparoscopic technique, the surgery is done through two small cuts (incisions) in the abdomen. A thin, lighted tube with a tiny camera on the end (laparoscope) is inserted into one of the incisions. The tools needed for the procedure are put through the other incision.  A robotic technique may be chosen to perform complex surgery in a small space. In the robotic technique, small incisions will be made. A camera and surgical instruments are passed through the incisions. Surgical instruments will be controlled with the help of a robotic arm.  In the open technique, the surgery is done through one large incision in the abdomen.  Using any of these techniques, the surgeon removes the fallopian tubes and ovaries. The blood vessels will be clamped and tied.  The surgeon then uses staples or stitches to close the incision or incisions. AFTER THE PROCEDURE  You will be taken to a recovery area where you will be monitored for 1 to 3 hours. Your blood pressure, pulse, and temperature will be checked often. You will remain in the recovery area until you are stable and waking up.  If the laparoscopic technique was used, you may be allowed to go home after several hours. You may have some shoulder pain after the laparoscopic procedure. This is normal and usually goes away in a day or two.  If the open technique was used, you will be admitted to the hospital for a couple of days.  You will be given pain medicine as needed.  The IV access tube and catheter will be removed before you are discharged. Document Released: 12/14/2005 Document Revised: 12/19/2013 Document Reviewed: 06/07/2013 [facility] Patient Information 2015 [place], [place]. This information is not intended to replace advice given to you by your health care provider. Make sure you discuss any questions you have  with your health care provider. Abdominal Hysterectomy Abdominal hysterectomy is a surgical procedure to remove your womb (uterus). Your uterus is the muscular organ that contains a developing baby. This surgery is done for many reasons. You may need an abdominal hysterectomy if you have cancer, growths (tumors), long-term pain, or bleeding. You may also have this procedure if your uterus has slipped down into your vagina (uterine prolapse). Depending on why you need an abdominal hysterectomy, you may also have other reproductive organs removed. These could include the part of your vagina that connects with your uterus (cervix), the organs that make eggs (ovaries), and the tubes that connect the ovaries to the uterus (fallopian tubes). LET [facility] CARE PROVIDER KNOW ABOUT:   Any allergies you have.  All medicines you are taking, including vitamins, herbs, eye drops, creams, and over-the-counter medicines.  Previous problems you or members of your family have had with the use of anesthetics.  Any blood disorders  you have.  Previous surgeries you have had.  Medical conditions you have. RISKS AND COMPLICATIONS Generally, this is a safe procedure. However, as with any procedure, problems can occur. Infection is the most common problem after an abdominal hysterectomy. Other possible problems include:  Bleeding.  Formation of blood clots that may break free and travel to your lungs.  Injury to other organs near your uterus.  Nerve injury causing nerve pain.  Decreased interest in sex or pain during sexual intercourse. BEFORE THE PROCEDURE  Abdominal hysterectomy is a major surgical procedure. It can affect the way you feel about yourself. Talk to your health care provider about the physical and emotional changes hysterectomy may cause.  You may need to have blood work and X-rays done before surgery.  Quit smoking if you smoke. Ask your health care provider for help if you are  struggling to quit.  Stop taking medicines that thin your blood as directed by your health care provider.  You may be instructed to take antibiotic medicines or laxatives before surgery.  Do not eat or drink anything for 6-8 hours before surgery.  Take your regular medicines with a small sip of water.  Bathe or shower the night or morning before surgery. PROCEDURE  Abdominal hysterectomy is done in the operating room at the hospital.  In most cases, you will be given a medicine that makes you go to sleep (general anesthetic).  The surgeon will make a cut (incision) through the skin in your lower belly.  The incision may be about 5-7 inches long. It may go side-to-side or up-and-down.  The surgeon will move aside the body tissue that covers your uterus. The surgeon will then carefully take out your uterus along with any of your other reproductive organs that need to be removed.  Bleeding will be controlled with clamps or sutures.  The surgeon will close your incision with sutures or metal clips. AFTER THE PROCEDURE  You will have some pain immediately after the procedure.  You will be given pain medicine in the recovery room.  You will be taken to your hospital room when you have recovered from the anesthesia.  You may need to stay in the hospital for 2-5 days.  You will be given instructions for recovery at home. Document Released: 12/19/2013 Document Reviewed: 10/06/2013 [facility] Patient Information 2015 [place], [place]. This information is not intended to replace advice given to you by your health care provider. Make sure you discuss any questions you have with your health care provider. PATIENT INSTRUCTIONS POST-ANESTHESIA  IMMEDIATELY FOLLOWING SURGERY:  Do not drive or operate machinery for the first twenty four hours after surgery.  Do not make any important decisions for twenty four hours after surgery or while taking narcotic pain medications or sedatives.  If you  develop intractable nausea and vomiting or a severe headache please notify your doctor immediately.  FOLLOW-UP:  Please make an appointment with your surgeon as instructed. You do not need to follow up with anesthesia unless specifically instructed to do so.  WOUND CARE INSTRUCTIONS (if applicable):  Keep a dry clean dressing on the anesthesia/puncture wound site if there is drainage.  Once the wound has quit draining you may leave it open to air.  Generally you should leave the bandage intact for twenty four hours unless there is drainage.  If the epidural site drains for more than 36-48 hours please call the anesthesia department.  QUESTIONS?:  Please feel free to call your physician or the  hospital operator if you have any questions, and they will be happy to assist you.

## 2014-07-05 ENCOUNTER — Encounter (HOSPITAL_COMMUNITY): Payer: Self-pay

## 2014-07-05 ENCOUNTER — Other Ambulatory Visit: Payer: Self-pay

## 2014-07-05 ENCOUNTER — Encounter (HOSPITAL_COMMUNITY)
Admission: RE | Admit: 2014-07-05 | Discharge: 2014-07-05 | Disposition: A | Discharge status: Home / Self Care | Payer: BC Managed Care – PPO | Source: Ambulatory Visit | Attending: Obstetrics and Gynecology | Admitting: Obstetrics and Gynecology

## 2014-07-05 ENCOUNTER — Other Ambulatory Visit: Payer: Self-pay | Admitting: Obstetrics and Gynecology

## 2014-07-05 DIAGNOSIS — Z01810 Encounter for preprocedural cardiovascular examination: Secondary | ICD-10-CM | POA: Insufficient documentation

## 2014-07-05 DIAGNOSIS — Z01812 Encounter for preprocedural laboratory examination: Secondary | ICD-10-CM | POA: Insufficient documentation

## 2014-07-05 LAB — COMPREHENSIVE METABOLIC PANEL
ALBUMIN: 3.6 g/dL (ref 3.5–5.2)
ALT: 15 U/L (ref 0–35)
AST: 17 U/L (ref 0–37)
Alkaline Phosphatase: 66 U/L (ref 39–117)
Anion gap: 12 (ref 5–15)
BILIRUBIN TOTAL: 0.7 mg/dL (ref 0.3–1.2)
BUN: 8 mg/dL (ref 6–23)
CHLORIDE: 102 meq/L (ref 96–112)
CO2: 26 mEq/L (ref 19–32)
CREATININE: 0.88 mg/dL (ref 0.50–1.10)
Calcium: 9.9 mg/dL (ref 8.4–10.5)
GFR calc Af Amer: 86 mL/min — ABNORMAL LOW (ref >90)
GFR calc non Af Amer: 74 mL/min — ABNORMAL LOW (ref >90)
Glucose, Bld: 87 mg/dL (ref 70–99)
Potassium: 4.1 mEq/L (ref 3.7–5.3)
Sodium: 140 mEq/L (ref 137–147)
TOTAL PROTEIN: 7.9 g/dL (ref 6.0–8.3)

## 2014-07-05 LAB — URINALYSIS, ROUTINE W REFLEX MICROSCOPIC
BILIRUBIN URINE: NEGATIVE
Glucose, UA: NEGATIVE mg/dL
Ketones, ur: NEGATIVE mg/dL
Nitrite: NEGATIVE
Specific Gravity, Urine: 1.015 (ref 1.005–1.030)
UROBILINOGEN UA: 0.2 mg/dL (ref 0.0–1.0)
pH: 6.5 (ref 5.0–8.0)

## 2014-07-05 LAB — URINE MICROSCOPIC-ADD ON

## 2014-07-05 LAB — TYPE AND SCREEN
ABO/RH(D): O POS
ANTIBODY SCREEN: NEGATIVE

## 2014-07-05 LAB — CBC
HCT: 37.4 % (ref 36.0–46.0)
Hemoglobin: 12.6 g/dL (ref 12.0–15.0)
MCH: 27.1 pg (ref 26.0–34.0)
MCHC: 33.7 g/dL (ref 30.0–36.0)
MCV: 80.4 fL (ref 78.0–100.0)
PLATELETS: 251 10*3/uL (ref 150–400)
RBC: 4.65 MIL/uL (ref 3.87–5.11)
RDW: 13.6 % (ref 11.5–15.5)
WBC: 6.7 10*3/uL (ref 4.0–10.5)

## 2014-07-05 LAB — HCG, SERUM, QUALITATIVE: PREG SERUM: NEGATIVE

## 2014-07-05 NOTE — Progress Notes (Signed)
07/05/14 1110  OBSTRUCTIVE SLEEP APNEA  Have you ever been diagnosed with sleep apnea through a sleep study? No  Do you snore loudly (loud enough to be heard through closed doors)?  0  Do you often feel tired, fatigued, or sleepy during the daytime? 1  Has anyone observed you stop breathing during your sleep? 0  Do you have, or are you being treated for high blood pressure? 1  BMI more than 35 kg/m2? 1  Age over 52 years old? 1  Neck circumference greater than 40 cm/16 inches? 0  Gender: 0  Obstructive Sleep Apnea Score 4  Score 4 or greater  Results sent to PCP

## 2014-07-10 ENCOUNTER — Inpatient Hospital Stay (HOSPITAL_COMMUNITY)
Admission: RE | Admit: 2014-07-10 | Discharge: 2014-07-12 | DRG: 742 | Disposition: A | Discharge status: Home / Self Care | Payer: BC Managed Care – PPO | Source: Ambulatory Visit | Attending: Obstetrics and Gynecology | Admitting: Obstetrics and Gynecology

## 2014-07-10 ENCOUNTER — Encounter (HOSPITAL_COMMUNITY): Payer: Self-pay | Admitting: *Deleted

## 2014-07-10 ENCOUNTER — Encounter (HOSPITAL_COMMUNITY)
Admission: RE | Disposition: A | Discharge status: Home / Self Care | Payer: Self-pay | Source: Ambulatory Visit | Attending: Obstetrics and Gynecology

## 2014-07-10 ENCOUNTER — Encounter (HOSPITAL_COMMUNITY): Payer: BC Managed Care – PPO | Admitting: Anesthesiology

## 2014-07-10 ENCOUNTER — Inpatient Hospital Stay (HOSPITAL_COMMUNITY): Payer: BC Managed Care – PPO | Admitting: Anesthesiology

## 2014-07-10 DIAGNOSIS — N72 Inflammatory disease of cervix uteri: Secondary | ICD-10-CM

## 2014-07-10 DIAGNOSIS — Z6841 Body Mass Index (BMI) 40.0 and over, adult: Secondary | ICD-10-CM

## 2014-07-10 DIAGNOSIS — N8000 Endometriosis of the uterus, unspecified: Secondary | ICD-10-CM

## 2014-07-10 DIAGNOSIS — Z90721 Acquired absence of ovaries, unilateral: Secondary | ICD-10-CM

## 2014-07-10 DIAGNOSIS — D259 Leiomyoma of uterus, unspecified: Principal | ICD-10-CM | POA: Diagnosis present

## 2014-07-10 DIAGNOSIS — E669 Obesity, unspecified: Secondary | ICD-10-CM | POA: Diagnosis present

## 2014-07-10 DIAGNOSIS — I498 Other specified cardiac arrhythmias: Secondary | ICD-10-CM | POA: Diagnosis not present

## 2014-07-10 DIAGNOSIS — D252 Subserosal leiomyoma of uterus: Secondary | ICD-10-CM

## 2014-07-10 DIAGNOSIS — Z90710 Acquired absence of both cervix and uterus: Secondary | ICD-10-CM

## 2014-07-10 DIAGNOSIS — N831 Corpus luteum cyst of ovary, unspecified side: Secondary | ICD-10-CM

## 2014-07-10 HISTORY — PX: ABDOMINAL HYSTERECTOMY: SHX81

## 2014-07-10 HISTORY — PX: SALPINGOOPHORECTOMY: SHX82

## 2014-07-10 SURGERY — HYSTERECTOMY, ABDOMINAL
Anesthesia: General

## 2014-07-10 MED ORDER — ROCURONIUM BROMIDE 50 MG/5ML IV SOLN
INTRAVENOUS | Status: AC
  Filled 2014-07-10: qty 1

## 2014-07-10 MED ORDER — SODIUM CHLORIDE 0.9 % IN NEBU
INHALATION_SOLUTION | RESPIRATORY_TRACT | Status: AC
  Filled 2014-07-10: qty 3

## 2014-07-10 MED ORDER — ONDANSETRON HCL 4 MG/2ML IJ SOLN
INTRAMUSCULAR | Status: AC
  Filled 2014-07-10: qty 2

## 2014-07-10 MED ORDER — ONDANSETRON HCL 4 MG/2ML IJ SOLN: 4.0000 mg | Freq: Once | INTRAMUSCULAR | Status: DC | PRN

## 2014-07-10 MED ORDER — NALOXONE HCL 0.4 MG/ML IJ SOLN: 0.4000 mg | INTRAMUSCULAR | Status: DC | PRN

## 2014-07-10 MED ORDER — FENTANYL CITRATE 0.05 MG/ML IJ SOLN
50.0000 ug | INTRAMUSCULAR | Status: AC | PRN
  Administered 2014-07-10 (×2): 50 ug via INTRAVENOUS

## 2014-07-10 MED ORDER — LISINOPRIL-HYDROCHLOROTHIAZIDE 20-12.5 MG PO TABS: 1.0000 | ORAL_TABLET | Freq: Every day | ORAL | Status: DC

## 2014-07-10 MED ORDER — GLYCOPYRROLATE 0.2 MG/ML IJ SOLN
INTRAMUSCULAR | Status: AC
Start: 2014-07-10 — End: 2014-07-10
  Filled 2014-07-10: qty 3

## 2014-07-10 MED ORDER — DOCUSATE SODIUM 100 MG PO CAPS
100.0000 mg | ORAL_CAPSULE | Freq: Two times a day (BID) | ORAL | Status: DC
  Administered 2014-07-11 – 2014-07-12 (×3): 100 mg via ORAL
  Filled 2014-07-10 (×3): qty 1

## 2014-07-10 MED ORDER — NEOSTIGMINE METHYLSULFATE 10 MG/10ML IV SOLN
INTRAVENOUS | Status: AC
  Filled 2014-07-10: qty 1

## 2014-07-10 MED ORDER — SODIUM CHLORIDE 0.9 % IV SOLN
INTRAVENOUS | Status: DC
  Administered 2014-07-10: 13:00:00 via INTRAVENOUS

## 2014-07-10 MED ORDER — PHENYLEPHRINE HCL 10 MG/ML IJ SOLN
INTRAMUSCULAR | Status: DC | PRN
  Administered 2014-07-10: 100 ug via INTRAVENOUS
  Administered 2014-07-10 (×2): 50 ug via INTRAVENOUS
  Administered 2014-07-10: 100 ug via INTRAVENOUS

## 2014-07-10 MED ORDER — FENTANYL CITRATE 0.05 MG/ML IJ SOLN
25.0000 ug | INTRAMUSCULAR | Status: DC | PRN
  Administered 2014-07-10 (×4): 50 ug via INTRAVENOUS
  Filled 2014-07-10 (×3): qty 2

## 2014-07-10 MED ORDER — ALBUTEROL SULFATE (2.5 MG/3ML) 0.083% IN NEBU
INHALATION_SOLUTION | RESPIRATORY_TRACT | Status: AC
  Filled 2014-07-10: qty 3

## 2014-07-10 MED ORDER — ONDANSETRON HCL 4 MG/2ML IJ SOLN: 4.0000 mg | Freq: Four times a day (QID) | INTRAMUSCULAR | Status: DC | PRN

## 2014-07-10 MED ORDER — 0.9 % SODIUM CHLORIDE (POUR BTL) OPTIME
TOPICAL | Status: DC | PRN
  Administered 2014-07-10 (×2): 1000 mL

## 2014-07-10 MED ORDER — LIDOCAINE HCL (CARDIAC) 20 MG/ML IV SOLN
INTRAVENOUS | Status: DC | PRN
  Administered 2014-07-10: 30 mg via INTRAVENOUS

## 2014-07-10 MED ORDER — PROPOFOL 10 MG/ML IV BOLUS
INTRAVENOUS | Status: AC
  Filled 2014-07-10: qty 20

## 2014-07-10 MED ORDER — EPHEDRINE SULFATE 50 MG/ML IJ SOLN
INTRAMUSCULAR | Status: DC | PRN
  Administered 2014-07-10: 5 mg via INTRAVENOUS

## 2014-07-10 MED ORDER — LACTATED RINGERS IV SOLN
INTRAVENOUS | Status: DC
  Administered 2014-07-10: 07:00:00 via INTRAVENOUS

## 2014-07-10 MED ORDER — ATROPINE SULFATE 1 MG/ML IJ SOLN
INTRAMUSCULAR | Status: AC
  Filled 2014-07-10: qty 1

## 2014-07-10 MED ORDER — NEOSTIGMINE METHYLSULFATE 10 MG/10ML IV SOLN
INTRAVENOUS | Status: DC | PRN
  Administered 2014-07-10: 5 mg via INTRAVENOUS

## 2014-07-10 MED ORDER — ONDANSETRON HCL 4 MG/2ML IJ SOLN
4.0000 mg | Freq: Once | INTRAMUSCULAR | Status: AC
  Administered 2014-07-10: 4 mg via INTRAVENOUS

## 2014-07-10 MED ORDER — HYDROCHLOROTHIAZIDE 12.5 MG PO CAPS
12.5000 mg | ORAL_CAPSULE | Freq: Every day | ORAL | Status: DC
  Administered 2014-07-12: 12.5 mg via ORAL
  Filled 2014-07-10: qty 1

## 2014-07-10 MED ORDER — FENTANYL CITRATE 0.05 MG/ML IJ SOLN
INTRAMUSCULAR | Status: AC
  Filled 2014-07-10: qty 5

## 2014-07-10 MED ORDER — DIPHENHYDRAMINE HCL 50 MG/ML IJ SOLN
12.5000 mg | Freq: Four times a day (QID) | INTRAMUSCULAR | Status: DC | PRN
  Administered 2014-07-11: 12.5 mg via INTRAVENOUS
  Filled 2014-07-10: qty 1

## 2014-07-10 MED ORDER — BIOTENE DRY MOUTH MT LIQD
15.0000 mL | Freq: Two times a day (BID) | OROMUCOSAL | Status: DC
  Administered 2014-07-10 – 2014-07-12 (×4): 15 mL via OROMUCOSAL

## 2014-07-10 MED ORDER — FENTANYL CITRATE 0.05 MG/ML IJ SOLN
INTRAMUSCULAR | Status: AC
  Filled 2014-07-10: qty 2

## 2014-07-10 MED ORDER — DIPHENHYDRAMINE HCL 12.5 MG/5ML PO ELIX: 12.5000 mg | ORAL_SOLUTION | Freq: Four times a day (QID) | ORAL | Status: DC | PRN

## 2014-07-10 MED ORDER — SODIUM CHLORIDE 0.9 % IJ SOLN: 9.0000 mL | INTRAMUSCULAR | Status: DC | PRN

## 2014-07-10 MED ORDER — SODIUM CHLORIDE 0.9 % IJ SOLN
INTRAMUSCULAR | Status: AC
  Filled 2014-07-10: qty 10

## 2014-07-10 MED ORDER — DEXTROSE 5 % IV SOLN
3.0000 g | INTRAVENOUS | Status: DC
  Filled 2014-07-10: qty 3000

## 2014-07-10 MED ORDER — ALBUTEROL SULFATE (2.5 MG/3ML) 0.083% IN NEBU
2.5000 mg | INHALATION_SOLUTION | RESPIRATORY_TRACT | Status: DC | PRN
Start: 2014-07-10 — End: 2014-07-12

## 2014-07-10 MED ORDER — OXYCODONE-ACETAMINOPHEN 5-325 MG PO TABS
1.0000 | ORAL_TABLET | ORAL | Status: DC | PRN
  Administered 2014-07-10 – 2014-07-12 (×4): 2 via ORAL
  Filled 2014-07-10 (×4): qty 2

## 2014-07-10 MED ORDER — SODIUM CHLORIDE 0.9 % IJ SOLN
INTRAMUSCULAR | Status: AC
  Filled 2014-07-10: qty 20

## 2014-07-10 MED ORDER — PROPOFOL 10 MG/ML IV BOLUS
INTRAVENOUS | Status: DC | PRN
  Administered 2014-07-10: 200 mg via INTRAVENOUS

## 2014-07-10 MED ORDER — CEFAZOLIN SODIUM 1-5 GM-% IV SOLN: 1.0000 g | Freq: Once | INTRAVENOUS | Status: DC

## 2014-07-10 MED ORDER — LISINOPRIL 10 MG PO TABS
20.0000 mg | ORAL_TABLET | Freq: Every day | ORAL | Status: DC
  Administered 2014-07-12: 20 mg via ORAL
  Filled 2014-07-10: qty 2

## 2014-07-10 MED ORDER — ATROPINE SULFATE 1 MG/ML IJ SOLN
0.5000 mg | INTRAMUSCULAR | Status: AC | PRN
  Administered 2014-07-10 (×2): 0.5 mg via INTRAVENOUS

## 2014-07-10 MED ORDER — CEFAZOLIN SODIUM-DEXTROSE 2-3 GM-% IV SOLR
2.0000 g | Freq: Once | INTRAVENOUS | Status: AC
  Administered 2014-07-10: 3 g via INTRAVENOUS

## 2014-07-10 MED ORDER — ONDANSETRON HCL 4 MG PO TABS: 4.0000 mg | ORAL_TABLET | Freq: Four times a day (QID) | ORAL | Status: DC | PRN

## 2014-07-10 MED ORDER — IBUPROFEN 600 MG PO TABS: 600.0000 mg | ORAL_TABLET | Freq: Four times a day (QID) | ORAL | Status: DC | PRN

## 2014-07-10 MED ORDER — LACTATED RINGERS IV SOLN
INTRAVENOUS | Status: DC | PRN
  Administered 2014-07-10: 1000 mL
  Administered 2014-07-10 (×3): via INTRAVENOUS

## 2014-07-10 MED ORDER — KETOROLAC TROMETHAMINE 30 MG/ML IJ SOLN
30.0000 mg | Freq: Four times a day (QID) | INTRAMUSCULAR | Status: DC
  Administered 2014-07-10 – 2014-07-12 (×6): 30 mg via INTRAVENOUS
  Filled 2014-07-10 (×7): qty 1

## 2014-07-10 MED ORDER — MIDAZOLAM HCL 2 MG/2ML IJ SOLN
1.0000 mg | INTRAMUSCULAR | Status: DC | PRN
  Administered 2014-07-10: 2 mg via INTRAVENOUS

## 2014-07-10 MED ORDER — PANTOPRAZOLE SODIUM 40 MG PO TBEC
40.0000 mg | DELAYED_RELEASE_TABLET | Freq: Every day | ORAL | Status: DC
  Administered 2014-07-11 – 2014-07-12 (×2): 40 mg via ORAL
  Filled 2014-07-10 (×2): qty 1

## 2014-07-10 MED ORDER — KETOROLAC TROMETHAMINE 30 MG/ML IJ SOLN
30.0000 mg | Freq: Four times a day (QID) | INTRAMUSCULAR | Status: DC
  Filled 2014-07-10: qty 1

## 2014-07-10 MED ORDER — ROCURONIUM BROMIDE 100 MG/10ML IV SOLN
INTRAVENOUS | Status: DC | PRN
  Administered 2014-07-10: 10 mg via INTRAVENOUS
  Administered 2014-07-10: 50 mg via INTRAVENOUS

## 2014-07-10 MED ORDER — CEFAZOLIN SODIUM-DEXTROSE 2-3 GM-% IV SOLR
INTRAVENOUS | Status: AC
  Filled 2014-07-10: qty 50

## 2014-07-10 MED ORDER — FENTANYL CITRATE 0.05 MG/ML IJ SOLN
INTRAMUSCULAR | Status: DC | PRN
  Administered 2014-07-10: 25 ug via INTRAVENOUS
  Administered 2014-07-10 (×3): 50 ug via INTRAVENOUS
  Administered 2014-07-10: 25 ug via INTRAVENOUS
  Administered 2014-07-10 (×3): 50 ug via INTRAVENOUS
  Administered 2014-07-10: 100 ug via INTRAVENOUS

## 2014-07-10 MED ORDER — SODIUM CHLORIDE 0.9 % IJ SOLN
INTRAMUSCULAR | Status: AC
Start: 2014-07-10 — End: 2014-07-10
  Filled 2014-07-10: qty 10

## 2014-07-10 MED ORDER — DEXAMETHASONE SODIUM PHOSPHATE 4 MG/ML IJ SOLN
INTRAMUSCULAR | Status: DC | PRN
  Administered 2014-07-10: 8 mg via INTRAVENOUS

## 2014-07-10 MED ORDER — GLYCOPYRROLATE 0.2 MG/ML IJ SOLN
INTRAMUSCULAR | Status: AC
  Filled 2014-07-10: qty 2

## 2014-07-10 MED ORDER — CEFAZOLIN SODIUM 1-5 GM-% IV SOLN
INTRAVENOUS | Status: AC
  Filled 2014-07-10: qty 50

## 2014-07-10 MED ORDER — ALBUTEROL SULFATE (2.5 MG/3ML) 0.083% IN NEBU
2.5000 mg | INHALATION_SOLUTION | Freq: Once | RESPIRATORY_TRACT | Status: AC
  Administered 2014-07-10: 2.5 mg via RESPIRATORY_TRACT

## 2014-07-10 MED ORDER — GLYCOPYRROLATE 0.2 MG/ML IJ SOLN
0.2000 mg | Freq: Once | INTRAMUSCULAR | Status: AC
  Administered 2014-07-10: 0.2 mg via INTRAVENOUS

## 2014-07-10 MED ORDER — MIDAZOLAM HCL 2 MG/2ML IJ SOLN
INTRAMUSCULAR | Status: AC
  Filled 2014-07-10: qty 2

## 2014-07-10 MED ORDER — BUPIVACAINE LIPOSOME 1.3 % IJ SUSP
INTRAMUSCULAR | Status: AC
  Filled 2014-07-10: qty 20

## 2014-07-10 MED ORDER — KETOROLAC TROMETHAMINE 30 MG/ML IJ SOLN
30.0000 mg | Freq: Once | INTRAMUSCULAR | Status: AC
  Administered 2014-07-10: 30 mg via INTRAVENOUS
  Filled 2014-07-10: qty 1

## 2014-07-10 MED ORDER — GLYCOPYRROLATE 0.2 MG/ML IJ SOLN
INTRAMUSCULAR | Status: DC | PRN
  Administered 2014-07-10: 0.6 mg via INTRAVENOUS

## 2014-07-10 MED ORDER — HYDROMORPHONE 0.3 MG/ML IV SOLN
INTRAVENOUS | Status: DC
  Administered 2014-07-10: 13:00:00 via INTRAVENOUS
  Administered 2014-07-10: 2.7 mg via INTRAVENOUS
  Administered 2014-07-10: 2.4 mg via INTRAVENOUS
  Administered 2014-07-10: 23:00:00 via INTRAVENOUS
  Administered 2014-07-11: 2.1 mg via INTRAVENOUS
  Administered 2014-07-11: 0.9 mg via INTRAVENOUS
  Administered 2014-07-11: 1.5 mg via INTRAVENOUS
  Administered 2014-07-11: 0.9 mg via INTRAVENOUS
  Filled 2014-07-10 (×2): qty 25

## 2014-07-10 MED ORDER — GLYCOPYRROLATE 0.2 MG/ML IJ SOLN
INTRAMUSCULAR | Status: AC
  Filled 2014-07-10: qty 1

## 2014-07-10 SURGICAL SUPPLY — 75 items
APL SKNCLS STERI-STRIP NONHPOA (GAUZE/BANDAGES/DRESSINGS) ×2
APPLIER CLIP 11 MED OPEN (CLIP)
APPLIER CLIP 13 LRG OPEN (CLIP)
APR CLP LRG 13 20 CLIP (CLIP)
APR CLP MED 11 20 MLT OPN (CLIP)
BAG HAMPER (MISCELLANEOUS) ×4 IMPLANT
BENZOIN TINCTURE PRP APPL 2/3 (GAUZE/BANDAGES/DRESSINGS) ×4 IMPLANT
BLADE 10 SAFETY STRL DISP (BLADE) ×4 IMPLANT
CELLS DAT CNTRL 66122 CELL SVR (MISCELLANEOUS) IMPLANT
CLIP APPLIE 11 MED OPEN (CLIP) IMPLANT
CLIP APPLIE 13 LRG OPEN (CLIP) IMPLANT
CLOSURE WOUND 1/2 X4 (GAUZE/BANDAGES/DRESSINGS) ×2
CLOTH BEACON ORANGE TIMEOUT ST (SAFETY) ×4 IMPLANT
COVER LIGHT HANDLE STERIS (MISCELLANEOUS) ×8 IMPLANT
DRAPE WARM FLUID 44X44 (DRAPE) ×4 IMPLANT
DRESSING TELFA 8X3 (GAUZE/BANDAGES/DRESSINGS) ×4 IMPLANT
DRSG OPSITE POSTOP 4X10 (GAUZE/BANDAGES/DRESSINGS) ×4 IMPLANT
DURAPREP 26ML APPLICATOR (WOUND CARE) ×4 IMPLANT
ELECT REM PT RETURN 9FT ADLT (ELECTROSURGICAL) ×4
ELECTRODE REM PT RTRN 9FT ADLT (ELECTROSURGICAL) ×2 IMPLANT
EVACUATOR DRAINAGE 10X20 100CC (DRAIN) IMPLANT
EVACUATOR SILICONE 100CC (DRAIN)
FORMALIN 10 PREFIL 480ML (MISCELLANEOUS) ×4 IMPLANT
GAUZE SPONGE 4X4 12PLY STRL (GAUZE/BANDAGES/DRESSINGS) ×4 IMPLANT
GLOVE BIO SURGEON STRL SZ8.5 (GLOVE) ×4 IMPLANT
GLOVE BIOGEL PI IND STRL 7.0 (GLOVE) ×4 IMPLANT
GLOVE BIOGEL PI IND STRL 8.5 (GLOVE) ×2 IMPLANT
GLOVE BIOGEL PI IND STRL 9 (GLOVE) ×2 IMPLANT
GLOVE BIOGEL PI INDICATOR 7.0 (GLOVE) ×4
GLOVE BIOGEL PI INDICATOR 8.5 (GLOVE) ×2
GLOVE BIOGEL PI INDICATOR 9 (GLOVE) ×2
GLOVE ECLIPSE 6.5 STRL STRAW (GLOVE) ×4 IMPLANT
GLOVE ECLIPSE 9.0 STRL (GLOVE) ×8 IMPLANT
GLOVE EXAM NITRILE MD LF STRL (GLOVE) ×8 IMPLANT
GLOVE SS BIOGEL STRL SZ 6.5 (GLOVE) ×2 IMPLANT
GLOVE SUPERSENSE BIOGEL SZ 6.5 (GLOVE) ×2
GOWN SPEC L3 XXLG W/TWL (GOWN DISPOSABLE) ×8 IMPLANT
GOWN STRL REUS W/TWL LRG LVL3 (GOWN DISPOSABLE) ×8 IMPLANT
INST SET MAJOR GENERAL (KITS) ×4 IMPLANT
KIT ROOM TURNOVER APOR (KITS) ×4 IMPLANT
MANIFOLD NEPTUNE II (INSTRUMENTS) ×4 IMPLANT
NEEDLE HYPO 18GX1.5 BLUNT FILL (NEEDLE) ×4 IMPLANT
NEEDLE HYPO 25X1 1.5 SAFETY (NEEDLE) ×4 IMPLANT
NEEDLE MAYO 6 CRC TAPER PT (NEEDLE) ×4 IMPLANT
NS IRRIG 1000ML POUR BTL (IV SOLUTION) ×8 IMPLANT
PACK ABDOMINAL MAJOR (CUSTOM PROCEDURE TRAY) ×4 IMPLANT
PAD ARMBOARD 7.5X6 YLW CONV (MISCELLANEOUS) ×4 IMPLANT
RETRACTOR WND ALEXIS 25 LRG (MISCELLANEOUS) IMPLANT
RTRCTR WOUND ALEXIS 18CM MED (MISCELLANEOUS)
RTRCTR WOUND ALEXIS 25CM LRG (MISCELLANEOUS)
SET BASIN LINEN APH (SET/KITS/TRAYS/PACK) ×4 IMPLANT
SPONGE DRAIN TRACH 4X4 STRL 2S (GAUZE/BANDAGES/DRESSINGS) IMPLANT
SPONGE LAP 18X18 X RAY DECT (DISPOSABLE) IMPLANT
STAPLER VISISTAT 35W (STAPLE) ×4 IMPLANT
STRIP CLOSURE SKIN 1/2X4 (GAUZE/BANDAGES/DRESSINGS) ×6 IMPLANT
SUT CHROMIC 0 CT 1 (SUTURE) ×32 IMPLANT
SUT CHROMIC 2 0 CT 1 (SUTURE) ×4 IMPLANT
SUT CHROMIC GUT BROWN 0 54 (SUTURE) IMPLANT
SUT CHROMIC GUT BROWN 0 54IN (SUTURE)
SUT ETHILON 3 0 FSL (SUTURE) IMPLANT
SUT MON AB 3-0 SH 27 (SUTURE) IMPLANT
SUT PDS AB CT VIOLET #0 27IN (SUTURE) IMPLANT
SUT PLAIN CT 1/2CIR 2-0 27IN (SUTURE) ×4 IMPLANT
SUT PROLENE 0 CT 1 30 (SUTURE) IMPLANT
SUT VIC AB 0 CT1 27 (SUTURE) ×4
SUT VIC AB 0 CT1 27XBRD ANTBC (SUTURE) ×2 IMPLANT
SUT VIC AB 0 CTX 36 (SUTURE) ×4
SUT VIC AB 0 CTX36XBRD ANTBCTR (SUTURE) ×2 IMPLANT
SUT VIC AB 2-0 CT1 27 (SUTURE)
SUT VIC AB 2-0 CT1 TAPERPNT 27 (SUTURE) IMPLANT
SUT VICRYL 3 0 (SUTURE) IMPLANT
SUT VICRYL 4 0 KS 27 (SUTURE) ×4 IMPLANT
SYR 20CC LL (SYRINGE) ×4 IMPLANT
TOWEL BLUE STERILE X RAY DET (MISCELLANEOUS) ×4 IMPLANT
TRAY FOLEY CATH 16FR SILVER (SET/KITS/TRAYS/PACK) ×4 IMPLANT

## 2014-07-10 NOTE — H&P (Signed)
  CC & HPI:   [name] is a 53 y.o. female presenting today for a pre-op visit for a hysterectomy procedure, salpingectomy and oophorectomy. She states she is having cramping pelvic pain, apparently it is due to her uterine fibroids. She also said she has been gaining weight despite exercise and  Allegedly watching what she eats   ROS:   + discussion of hysterectomy, salpingectomy, and oophorectomy procedure  + cramping pelvic pain  +weight gain  No other complaints  Pertinent History Reviewed:   Reviewed: Significant for wt gain, exercising  Medical  Surgical Hx:  Medications: Reviewed & Updated - see associated section  Social History: Reviewed - reports that she has never smoked. She has never used smokeless tobacco.  Objective Findings:   Vitals: BP 118/80  Ht 5\' 4"  (1.626 m)  Wt 320 lb 9.6 oz (145.423 kg)  BMI 55.00 kg/m2  LMP 05/26/2014  Physical Examination: General appearance - alert, well appearing, and in no distress  Eyes - pupils equal and reactive, extraocular eye movements intact  Neck - supple, no significant adenopathy  Chest - clear to auscultation, no wheezes, rales or rhonchi, symmetric air entry  Heart - normal rate, regular rhythm, normal S1, S2, no murmurs, rubs, clicks or gallops  Abdomen - soft, nontender, nondistended, no masses or organomegaly  Physical Examination: Pelvic - normal external genitalia, vulva, vagina, cervix, uterus and adnexa, UTERUS: enlarged 14 weeks see prior visit  Pelvis U/S done on 03/28/14  Enlarged uterus with multiple fibroids distorting endometrial cavity, ENDOM-21.22mm, Rt ovary appears WNL, LT ovary with 2.6 x 2.1cm simple cyst noted, no free fluid noted within pelvis  Endometrial biopsy: Benign proliferative endometrium   Assessment & Plan:   A  1. Uterine fibroids, symptomatic  Obesity with bmi >55 Plan Abdominal hysterectomy , bilateral salpingoophorectomy, through midline incision.  P  1. TAH BSO 07/10/14

## 2014-07-10 NOTE — Brief Op Note (Signed)
07/10/2014  10:05 AM  PATIENT:  [name]  53 y.o. female  PRE-OPERATIVE DIAGNOSIS:  uterine fibroids 14 week. Symptomatic.  POST-OPERATIVE DIAGNOSIS:  uterine fibroids 14 week, symptomatic.  PROCEDURE:  Procedure(s): HYSTERECTOMY ABDOMINAL (N/A) SALPINGO OOPHORECTOMY (Bilateral)  SURGEON:  Surgeon(s) and Role:    * Jonnie Kind, MD - Primary  PHYSICIAN ASSISTANT:   ASSISTANTS: none   ANESTHESIA:   general  EBL:  Total I/O In: 2300 [I.V.:2300] Out: 475 [Urine:200; Blood:275]  BLOOD ADMINISTERED:none  DRAINS: Urinary Catheter (Foley)   LOCAL MEDICATIONS USED:  NONE  SPECIMEN:  Source of Specimen:  uterus tubes and ovaries.  DISPOSITION OF SPECIMEN:  PATHOLOGY  COUNTS:  YES  TOURNIQUET:  * No tourniquets in log *  DICTATION: .Dragon Dictation  PLAN OF CARE: Admit to inpatient   PATIENT DISPOSITION:  PACU - hemodynamically stable.   Delay start of Pharmacological VTE agent (>24hrs) due to surgical blood loss or risk of bleeding: not applicable

## 2014-07-10 NOTE — Anesthesia Postprocedure Evaluation (Addendum)
  Anesthesia Post-op Note  Patient: [name]  Procedure(s) Performed: Procedure(s): HYSTERECTOMY ABDOMINAL (N/A) SALPINGO OOPHORECTOMY (Bilateral)  Patient Location: PACU  Anesthesia Type:General  Level of Consciousness: oriented and sedated  Airway and Oxygen Therapy: Patient Spontanous Breathing and Patient connected to face mask oxygen  Post-op Pain: mild  Post-op Assessment: Patient's Cardiovascular Status Stable  Post-op Vital Signs: stable  Last Vitals:  Filed Vitals:   07/10/14 0722  BP: 132/85  Pulse:   Temp:   Resp: 24    Complications: Bradycardia in PACU probably from reversal agent treated with 1 mg atropine and 5mg  ephedrine  with return to NSR in 70's.

## 2014-07-10 NOTE — Anesthesia Postprocedure Evaluation (Addendum)
  Anesthesia Post-op Note  Patient: [name]  Procedure(s) Performed: Procedure(s): HYSTERECTOMY ABDOMINAL (N/A) SALPINGO OOPHORECTOMY (Bilateral)  Patient Location: PACU  Anesthesia Type:General  Level of Consciousness: awake and patient cooperative  Airway and Oxygen Therapy: Patient Spontanous Breathing and non-rebreather face mask  Post-op Pain: mild  Post-op Assessment: Post-op Vital signs reviewed, Patient's Cardiovascular Status Stable, Respiratory Function Stable, Patent Airway and No signs of Nausea or vomiting  Post-op Vital Signs: Reviewed and stable  Last Vitals:  Filed Vitals:   07/10/14 1000  BP: 86/52  Pulse: 45  Temp:   Resp: 17    Complications:   Low Heart rate and B/P treated in PACU at 1000. Now Heart rate is 62; B/P 110/63.

## 2014-07-10 NOTE — Addendum Note (Signed)
Addendum created 07/10/14 1455 by Vista Deck, CRNA   Modules edited: Anesthesia Events

## 2014-07-10 NOTE — Transfer of Care (Signed)
Immediate Anesthesia Transfer of Care Note  Patient: [name]  Procedure(s) Performed: Procedure(s): HYSTERECTOMY ABDOMINAL (N/A) SALPINGO OOPHORECTOMY (Bilateral)  Patient Location: PACU  Anesthesia Type:General  Level of Consciousness: sedated and patient cooperative  Airway & Oxygen Therapy: Patient Spontanous Breathing and Patient connected to face mask oxygen  Post-op Assessment: Report given to PACU RN and Post -op Vital signs reviewed and stable  Post vital signs: stable  Complications: No apparent anesthesia complications

## 2014-07-10 NOTE — Anesthesia Preprocedure Evaluation (Signed)
Anesthesia Evaluation  Patient identified by MRN, date of birth, ID band Patient awake    Reviewed: Allergy & Precautions, H&P , NPO status , Patient's Chart, lab work & pertinent test results  Airway Mallampati: I TM Distance: >3 FB     Dental  (+) Teeth Intact   Pulmonary asthma , sleep apnea ,  breath sounds clear to auscultation        Cardiovascular hypertension, Pt. on medications Rhythm:Regular Rate:Normal     Neuro/Psych    GI/Hepatic negative GI ROS,   Endo/Other  Morbid obesity  Renal/GU      Musculoskeletal   Abdominal   Peds  Hematology  (+) Sickle cell trait ,   Anesthesia Other Findings   Reproductive/Obstetrics                           Anesthesia Physical Anesthesia Plan  ASA: II  Anesthesia Plan: General   Post-op Pain Management:    Induction: Intravenous, Rapid sequence and Cricoid pressure planned  Airway Management Planned: Oral ETT  Additional Equipment:   Intra-op Plan:   Post-operative Plan: Extubation in OR  Informed Consent: I have reviewed the patients History and Physical, chart, labs and discussed the procedure including the risks, benefits and alternatives for the proposed anesthesia with the patient or authorized representative who has indicated his/her understanding and acceptance.     Plan Discussed with:   Anesthesia Plan Comments:         Anesthesia Quick Evaluation

## 2014-07-10 NOTE — Op Note (Signed)
07/10/2014  10:05 AM  PATIENT:  [name]  53 y.o. female  PRE-OPERATIVE DIAGNOSIS:  uterine fibroids 14 week. Symptomatic.  POST-OPERATIVE DIAGNOSIS:  uterine fibroids 14 week, symptomatic.  PROCEDURE:  Procedure(s): HYSTERECTOMY ABDOMINAL (N/A) SALPINGO OOPHORECTOMY (Bilateral)  SURGEON:  Surgeon(s) and Role:    * Jonnie Kind, MD - Primary  PHYSICIAN ASSISTANT:   ASSISTANTS: none Ashley RNFA  ANESTHESIA:   general  EBL:  Total I/O In: 2300 [I.V.:2300] Out: 456 [Urine:200; Blood:275]  BLOOD ADMINISTERED:none  DRAINS: Urinary Catheter (Foley)   LOCAL MEDICATIONS USED:  NONE  SPECIMEN:  Source of Specimen:  uterus tubes and ovaries.  DISPOSITION OF SPECIMEN:  PATHOLOGY  COUNTS:  YES  TOURNIQUET:  * No tourniquets in log *  DICTATION: .Dragon Dictation  PLAN OF CARE: Admit to inpatient   PATIENT DISPOSITION:  PACU - hemodynamically stable.   Delay start of Pharmacological VTE agent (>24hrs) due to surgical blood loss or risk of bleeding: not applicable  Details of procedure: Patient was taken to the operating room prepped and draped for abdominal procedure with a vaginal prepping Foley catheter in place antibiotics administered and timeout confirmed by surgical team midline vertical incision was made from the umbilicus to the symphysis pubis removing a 10 cm wide but 30 cm long ellipse of skin and fatty tissue to allow for improved access. The remaining fatty tissue was dissected down to the fascia which was opened vertically in the midline, and the peritoneum identified carefully elevated opened and the bowel packed away using laparotomy tapes, moistened blue radiographic towel, and a Balfour retractor placed in position with another towel under is edges of the retractor to ensure dissection of the pelvic structures and retroperitoneal structures . Uterus was identified, filling the pelvis. There were some thin adhesions to the ovary on the left. There were  multiple fibroids pedunculated and intramural once adhesions were freed up the uterus could be elevated. Round ligaments were clamped cut and suture ligated on each side. The left IP ligament was identified isolated clamped cut and suture ligated. Uterine vessels were left were skeletonized, doubly clamped with curved Heaney clamp and Kelly clamp place for backbleeding, transected and doubly ligated. Upper cardinal ligament was clamped cut and suture ligated with tagging of the suture pedicle bladder flap had been developed anteriorly. The right side was treated similarly. After the second clamping on the upper cardinal ligaments with straight Heaney clamp transection was done with knife dissection, and suture ligature performed on the right side as well. The uterine body was amputated off the lower uterine segment at this time, and lower cardinal ligaments were clamped cut and suture ligated bilaterally. A right angle clamp was slid through the endocervical canal into the anterior cervicovaginal fornix, and the anterior cervicovaginal fornix identified with Bovie cautery on coagulate setting used to to identify the cervicovaginal fornix. The bladder was well out of the way. The cervix was lactated off the cuff at this time. Aldridge stitches were placed in the at each lateral vaginal angle, and the remaining central portion of the cuff closed with a series of interrupted 0 chromic sutures hemostasis was good in mid pelvis support seemed satisfactory. Irrigation the pelvis followed hemostasis was satisfactory. Estimated blood loss 250 cc primarily during the early portion the case coming from the uterine vessels on the left., backbleeding from the left uterine body. Pedicles were inspected and confirmed as hemostatic. The ureters were confirmed once again as being well out of the area of surgery.  Laparotomy equipment was removed, and anterior peritoneum closed with 2-0 chromic, the fascia closed with running 0  Prolene, the subcutaneous tissues reapproximated with interrupted 20 plain after irrigation, then subcuticular 4-0 Vicryl closure the skin edges performed. Steri-Strips were applied. Patient recovery room in stable condition with estimated blood loss 250-275 cc, urine output 250 cc. Sponge and needle counts were correct .

## 2014-07-11 ENCOUNTER — Encounter (HOSPITAL_COMMUNITY): Payer: Self-pay | Admitting: Obstetrics and Gynecology

## 2014-07-11 LAB — CBC
HCT: 32.5 % — ABNORMAL LOW (ref 36.0–46.0)
Hemoglobin: 10.8 g/dL — ABNORMAL LOW (ref 12.0–15.0)
MCH: 26.9 pg (ref 26.0–34.0)
MCHC: 33.2 g/dL (ref 30.0–36.0)
MCV: 80.8 fL (ref 78.0–100.0)
Platelets: 214 10*3/uL (ref 150–400)
RBC: 4.02 MIL/uL (ref 3.87–5.11)
RDW: 13.6 % (ref 11.5–15.5)
WBC: 8.8 10*3/uL (ref 4.0–10.5)

## 2014-07-11 LAB — BASIC METABOLIC PANEL
Anion gap: 9 (ref 5–15)
BUN: 8 mg/dL (ref 6–23)
CO2: 26 mEq/L (ref 19–32)
CREATININE: 0.79 mg/dL (ref 0.50–1.10)
Calcium: 8.4 mg/dL (ref 8.4–10.5)
Chloride: 102 mEq/L (ref 96–112)
GFR calc non Af Amer: >90 mL/min (ref >90)
GLUCOSE: 96 mg/dL (ref 70–99)
Potassium: 3.9 mEq/L (ref 3.7–5.3)
Sodium: 137 mEq/L (ref 137–147)

## 2014-07-11 NOTE — Progress Notes (Signed)
Foley cathetar removed without difficulty, patient instructed not to get out of bed without staff, call bell within reach, bed alarm on

## 2014-07-11 NOTE — Care Management Utilization Note (Signed)
UR completed 

## 2014-07-11 NOTE — Care Management Note (Signed)
    Page 1 of 1   07/11/2014     4:01:32 PM CARE MANAGEMENT NOTE 07/11/2014  Patient:  [name], [name]   Account Number:  [id number]  Date Initiated:  07/11/2014  Documentation initiated by:  Vladimir Creeks  Subjective/Objective Assessment:   Admitted following TAH BSO. pt is from home with spouse, is independent, and will return home at D/C     Action/Plan:   no needs identified   Anticipated DC Date:  07/12/2014   Anticipated DC Plan:  [place]  CM consult      Choice offered to / List presented to:             Status of service:  Completed, signed off Medicare Important Message given?   (If response is "NO", the following Medicare IM given date fields will be blank) Date Medicare IM given:   Medicare IM given by:   Date Additional Medicare IM given:   Additional Medicare IM given by:    Discharge Disposition:  HOME/SELF CARE  Per UR Regulation:  Reviewed for med. necessity/level of care/duration of stay  If discussed at [place] of Stay Meetings, dates discussed:    Comments:  07/11/14 Taylor RN/CM

## 2014-07-11 NOTE — Progress Notes (Signed)
Patient's heart rate sustaining mid 40's to mid 50's.  Blood pressure 125/75.  Respirations 18 on 1 liter of O2.  Notified MD and will hold lisinopril and hydrochlorothiazide per MD's order.  Will continue to monitor patient.

## 2014-07-11 NOTE — Progress Notes (Signed)
Notified MD of drainage on abdominal dressing.  MD was aware of drainage and ordered dressing to be changed.  Notified oncoming nurse that dressing would need to be changed.

## 2014-07-11 NOTE — Progress Notes (Addendum)
1 Day Post-Op Procedure(s) (LRB): HYSTERECTOMY ABDOMINAL (N/A) SALPINGO OOPHORECTOMY (Bilateral)  Subjective: Patient reports tolerating PO.    Objective: I have reviewed patient's vital signs, intake and output and labs.  General: alert, cooperative and no distress Cardio: regular rate and rhythm GI: slight oozing of the incision,  Assessment: s/p Procedure(s): HYSTERECTOMY ABDOMINAL (N/A) SALPINGO OOPHORECTOMY (Bilateral): stable and tolerating diet  Plan: Advance diet Advance to PO medication Discontinue IV fluids  LOS: 1 day    [name] V 07/11/2014, 1:28 PM

## 2014-07-12 MED ORDER — DSS 100 MG PO CAPS: 100.0000 mg | ORAL_CAPSULE | Freq: Two times a day (BID) | ORAL | Status: DC

## 2014-07-12 MED ORDER — OXYCODONE-ACETAMINOPHEN 5-325 MG PO TABS: 1.0000 | ORAL_TABLET | ORAL | Status: DC | PRN

## 2014-07-12 NOTE — Progress Notes (Signed)
Patient with orders to be discharge home. Discharge instructions given, patient verbalized understanding. Prescriptions given. Patient stable. Patient left with family in private vehicle.  

## 2014-07-12 NOTE — Discharge Instructions (Signed)
Abdominal Hysterectomy, Care After These instructions give you information on caring for yourself after your procedure. Your doctor may also give you more specific instructions. Call your doctor if you have any problems or questions after your procedure.  HOME CARE It takes 4-6 weeks to recover from this surgery. Follow all of your doctor's instructions.   Only take medicines as told by your doctor.  Change your bandage as told by your doctor.  Return to your doctor to have your stitches taken out.  Take showers for 2-3 weeks. Ask your doctor when it is okay to shower. YES  Do not douche, use tampons, or have sex (intercourse) for at least 6 weeks or as told.  Follow your doctor's advice about exercise, lifting objects, driving, and general activities.  Get plenty of rest and sleep.  Do not lift anything heavier than a gallon of milk (about 10 pounds [4.5 kilograms]) for the first month after surgery.  Get back to your normal diet as told by your doctor.  Do not drink alcohol until your doctor says it is okay.  Take a medicine to help you poop (laxative) as told by your doctor.  Eating foods high in fiber may help you poop. Eat a lot of raw fruits and vegetables, whole grains, and beans.  Drink enough fluids to keep your pee (urine) clear or pale yellow.  Have someone help you at home for 1-2 weeks after your surgery.  Keep follow-up doctor visits as told. GET HELP IF:  You have chills or fever.  You have puffiness, redness, or pain in area of the cut (incision).  You have yellowish-white fluid (pus) coming from the cut.  You have a bad smell coming from the cut or bandage.  Your cut pulls apart.  You feel dizzy or light-headed.  You have pain or bleeding when you pee.  You keep having watery poop (diarrhea).  You keep feeling sick to your stomach (nauseous) or keep throwing up (vomiting).  You have fluid (discharge) coming from your vagina.  You have a  rash.  You have a reaction to your medicine.  You need stronger pain medicine. GET HELP RIGHT AWAY IF:   You have a fever and your symptoms suddenly get worse.  You have bad belly (abdominal) pain.  You have chest pain.  You are short of breath.  You pass out (faint).  You have pain, puffiness, or redness of your leg.  You bleed a lot from your vagina and notice clumps of tissue (clots). MAKE SURE YOU:   Understand these instructions.  Will watch your condition.  Will get help right away if you are not doing well or get worse. Document Released: 09/22/2008 Document Revised: 12/19/2013 Document Reviewed: 10/06/2013 [facility] Patient Information 2015 [place], [place]. This information is not intended to replace advice given to you by your health care provider. Make sure you discuss any questions you have with your health care provider.

## 2014-07-12 NOTE — Discharge Summary (Signed)
Physician Discharge Summary  Patient ID: [name] MRN: [id number] DOB/AGE: 1961/06/10 53 y.o.  Admit date: 07/10/2014 Discharge date: 07/12/2014  Admission Diagnoses:          Patient Information    Patient Name Sex DOB SSN   [name], [name] Female 02-12-1961 [id number]            H&P by Jonnie Kind, MD at 07/10/2014 6:04 AM    Author: Jonnie Kind, MD Service: Obstetrics/Gynecology Author Type: Physician   Filed: 07/10/2014 6:10 AM Note Time: 07/10/2014 6:04 AM Status: Signed   Editor: Jonnie Kind, MD (Physician)      CC & HPI:    [name] is a 53 y.o. female presenting today for a pre-op visit for a hysterectomy procedure, salpingectomy and oophorectomy. She states she is having cramping pelvic pain, apparently it is due to her uterine fibroids. She also said she has been gaining weight despite exercise and Allegedly watching what she eats    ROS:    + discussion of hysterectomy, salpingectomy, and oophorectomy procedure  + cramping pelvic pain  +weight gain  No other complaints    Pertinent History Reviewed:    Reviewed: Significant for wt gain, exercising  Medical  Surgical Hx:  Medications: Reviewed & Updated - see associated section  Social History: Reviewed - reports that she has never smoked. She has never used smokeless tobacco.    Objective Findings:    Vitals: BP 118/80  Ht 5\' 4"  (1.626 m)  Wt 320 lb 9.6 oz (145.423 kg)  BMI 55.00 kg/m2  LMP 05/26/2014  Physical Examination: General appearance - alert, well appearing, and in no distress  Eyes - pupils equal and reactive, extraocular eye movements intact  Neck - supple, no significant adenopathy  Chest - clear to auscultation, no wheezes, rales or rhonchi, symmetric air entry  Heart - normal rate, regular rhythm, normal S1, S2, no murmurs, rubs, clicks or gallops  Abdomen - soft, nontender, nondistended, no masses or organomegaly  Physical Examination: Pelvic - normal external  genitalia, vulva, vagina, cervix, uterus and adnexa, UTERUS: enlarged 14 weeks see prior visit  Pelvis U/S done on 03/28/14  Enlarged uterus with multiple fibroids distorting endometrial cavity, ENDOM-21.65mm, Rt ovary appears WNL, LT ovary with 2.6 x 2.1cm simple cyst noted, no free fluid noted within pelvis  Endometrial biopsy: Benign proliferative endometrium    Assessment & Plan:    A  1. Uterine fibroids, symptomatic  Obesity with bmi >55  Plan  Abdominal hysterectomy , bilateral salpingoophorectomy, through midline incision.  P  1. TAH BSO 07/10/14           Discharge Diagnoses: ut fibroids, adenomyosis, pelvic adhesions Active Problems:   S/P hysterectomy with oophorectomy   Discharged Condition: good  Hospital Course: Admitted , had hysterectomy with moderate ebl. 2 day stay with pain control on PCA. Some bradycardia on day 1. Uneventful discharge day 2. afebrile  Consults: None  Significant Diagnostic Studies: labs:  CBC Latest Ref Rng 07/11/2014 07/05/2014 03/21/2014  WBC 4.0 - 10.5 K/uL 8.8 6.7 7.7  Hemoglobin 12.0 - 15.0 g/dL 10.8(L) 12.6 12.1  Hematocrit 36.0 - 46.0 % 32.5(L) 37.4 36.1  Platelets 150 - 400 K/uL 214 251 270     Treatments: surgery: TAHBSO  Discharge Exam: Blood pressure 147/79, pulse 67, temperature 98.4 F (36.9 C), temperature source Oral, resp. rate 20, height 5\' 4"  (1.626 m), weight 321 lb 14 oz (146 kg), last menstrual period 05/26/2014, SpO2 95.00%. General  appearance: alert and cooperative Head: Normocephalic, without obvious abnormality, atraumatic Eyes: conjunctivae/corneas clear. PERRL, EOM's intact. Fundi benign. GI: soft, non-tender; bowel sounds normal; no masses,  no organomegaly incison intact  Disposition: 01-Home or Self Care  Discharge Instructions   Call MD for:  persistant nausea and vomiting    Complete by:  As directed      Call MD for:  severe uncontrolled pain    Complete by:  As directed      Call MD for:   temperature >100.4    Complete by:  As directed      Diet - low sodium heart healthy    Complete by:  As directed      Discharge instructions    Complete by:  As directed      Driving Restrictions    Complete by:  As directed   None x 2 wk     Increase activity slowly    Complete by:  As directed      Remove dressing in 48 hours    Complete by:  As directed      Sexual Activity Restrictions    Complete by:  As directed   None x 8 wk            Medication List         albuterol 108 (90 BASE) MCG/ACT inhaler  Commonly known as:  PROVENTIL HFA;VENTOLIN HFA  Inhale 2 puffs into the lungs every 6 (six) hours as needed. For shortness of breath     aspirin 325 MG tablet  Take 325 mg by mouth daily.     cholecalciferol 1000 UNITS tablet  Commonly known as:  VITAMIN D  Take 1,000 Units by mouth daily.     DSS 100 MG Caps  Take 100 mg by mouth 2 (two) times daily.     fish oil-omega-3 fatty acids 1000 MG capsule  Take 1 g by mouth 2 (two) times daily.     ibuprofen 200 MG tablet  Commonly known as:  ADVIL,MOTRIN  Take 400 mg by mouth every 6 (six) hours as needed. For headaches     lisinopril-hydrochlorothiazide 20-12.5 MG per tablet  Commonly known as:  PRINZIDE,ZESTORETIC  Take 1 tablet by mouth daily.     oxyCODONE-acetaminophen 5-325 MG per tablet  Commonly known as:  PERCOCET/ROXICET  Take 1-2 tablets by mouth every 4 (four) hours as needed for severe pain (moderate to severe pain (when tolerating fluids)).     vitamin C 500 MG tablet  Commonly known as:  ASCORBIC ACID  Take 500 mg by mouth daily.           Follow-up Information   Follow up with Jonnie Kind, MD. (If symptoms worsen, For wound re-check, As needed)    Specialties:  Obstetrics and Gynecology, Radiology   Contact information:   [place] [phone]       Signed: Jonnie Kind 07/12/2014, 7:20 AM

## 2014-07-13 ENCOUNTER — Telehealth: Payer: Self-pay | Admitting: *Deleted

## 2014-07-13 ENCOUNTER — Encounter: Payer: Self-pay | Admitting: Obstetrics and Gynecology

## 2014-07-13 ENCOUNTER — Emergency Department (HOSPITAL_COMMUNITY): Payer: BC Managed Care – PPO

## 2014-07-13 ENCOUNTER — Encounter (HOSPITAL_COMMUNITY): Payer: Self-pay | Admitting: Emergency Medicine

## 2014-07-13 ENCOUNTER — Emergency Department (HOSPITAL_COMMUNITY)
Admission: EM | Admit: 2014-07-13 | Discharge: 2014-07-13 | Disposition: A | Discharge status: Home / Self Care | Payer: BC Managed Care – PPO | Attending: Emergency Medicine | Admitting: Emergency Medicine

## 2014-07-13 ENCOUNTER — Ambulatory Visit (INDEPENDENT_AMBULATORY_CARE_PROVIDER_SITE_OTHER): Payer: BC Managed Care – PPO | Admitting: Obstetrics and Gynecology

## 2014-07-13 VITALS — BP 140/96 | Ht 64.0 in | Wt 322.0 lb

## 2014-07-13 DIAGNOSIS — J45901 Unspecified asthma with (acute) exacerbation: Secondary | ICD-10-CM | POA: Insufficient documentation

## 2014-07-13 DIAGNOSIS — Z7982 Long term (current) use of aspirin: Secondary | ICD-10-CM | POA: Insufficient documentation

## 2014-07-13 DIAGNOSIS — E669 Obesity, unspecified: Secondary | ICD-10-CM | POA: Insufficient documentation

## 2014-07-13 DIAGNOSIS — N200 Calculus of kidney: Secondary | ICD-10-CM

## 2014-07-13 DIAGNOSIS — Z79899 Other long term (current) drug therapy: Secondary | ICD-10-CM | POA: Insufficient documentation

## 2014-07-13 DIAGNOSIS — Z9079 Acquired absence of other genital organ(s): Principal | ICD-10-CM | POA: Insufficient documentation

## 2014-07-13 DIAGNOSIS — Z90722 Acquired absence of ovaries, bilateral: Principal | ICD-10-CM

## 2014-07-13 DIAGNOSIS — I10 Essential (primary) hypertension: Secondary | ICD-10-CM | POA: Insufficient documentation

## 2014-07-13 DIAGNOSIS — R51 Headache: Secondary | ICD-10-CM

## 2014-07-13 DIAGNOSIS — R0602 Shortness of breath: Secondary | ICD-10-CM

## 2014-07-13 DIAGNOSIS — Z90710 Acquired absence of both cervix and uterus: Secondary | ICD-10-CM | POA: Insufficient documentation

## 2014-07-13 LAB — BASIC METABOLIC PANEL
ANION GAP: 15 (ref 5–15)
BUN: 8 mg/dL (ref 6–23)
CALCIUM: 9.1 mg/dL (ref 8.4–10.5)
CO2: 24 mEq/L (ref 19–32)
Chloride: 102 mEq/L (ref 96–112)
Creatinine, Ser: 0.75 mg/dL (ref 0.50–1.10)
GFR calc Af Amer: >90 mL/min (ref >90)
GLUCOSE: 125 mg/dL — AB (ref 70–99)
Potassium: 3.5 mEq/L — ABNORMAL LOW (ref 3.7–5.3)
SODIUM: 141 meq/L (ref 137–147)

## 2014-07-13 LAB — CBC
HCT: 32.9 % — ABNORMAL LOW (ref 36.0–46.0)
Hemoglobin: 11.2 g/dL — ABNORMAL LOW (ref 12.0–15.0)
MCH: 27.4 pg (ref 26.0–34.0)
MCHC: 34 g/dL (ref 30.0–36.0)
MCV: 80.4 fL (ref 78.0–100.0)
PLATELETS: 212 10*3/uL (ref 150–400)
RBC: 4.09 MIL/uL (ref 3.87–5.11)
RDW: 13.6 % (ref 11.5–15.5)
WBC: 8.2 10*3/uL (ref 4.0–10.5)

## 2014-07-13 LAB — D-DIMER, QUANTITATIVE (NOT AT ARMC): D DIMER QUANT: 9.53 ug{FEU}/mL — AB (ref 0.00–0.48)

## 2014-07-13 MED ORDER — ACETAMINOPHEN-CODEINE #3 300-30 MG PO TABS: 1.0000 | ORAL_TABLET | ORAL | Status: DC | PRN

## 2014-07-13 MED ORDER — FUROSEMIDE 20 MG PO TABS: 20.0000 mg | ORAL_TABLET | Freq: Every day | ORAL | Status: DC

## 2014-07-13 MED ORDER — IOHEXOL 350 MG/ML SOLN
100.0000 mL | Freq: Once | INTRAVENOUS | Status: AC | PRN
  Administered 2014-07-13: 100 mL via INTRAVENOUS

## 2014-07-13 NOTE — ED Notes (Signed)
Pt had hysterectomy Tuesday and went home Thursday am, states she is more sob this morning.  Pt denies pain, states she "just can't breathe"

## 2014-07-13 NOTE — Patient Instructions (Signed)
Expect lots of urine production from the lasix.  Reduce the pain meds by d/c the percocet, and begin the tylenol #3 You may continue ibuprofen.

## 2014-07-13 NOTE — Progress Notes (Signed)
Patient ID: [name], female   DOB: January 20, 1961, 53 y.o.   MRN: [id number]   This chart was scribed by Erling Conte, Medical Scribe, for Dr. Mallory Shirk on 07/13/14 at 10:36 AM. This chart was reviewed by Dr. Mallory Shirk for accuracy.    Subjective:  [name] is a 53 y.o. female who presents to the clinic 3 days status post total abdominal hysterectomy, bilateral salpingectomy and oophorectomy.   Pt worked in today for shortness of breath and headache after surgery. Pt states that she was on oxygen in the hospital and was taken off 1 hour before she was discharged, an O2 sat was not rechecked before the pt left the hospital. Pt states that after taking the medication she noticed the same feeling as before. Pt was seen in the ED early this morning, took the pain medication( 2 percocet at 1/1:30), started having SOB about 30 minutes after taking the medication. Pt took the medication again around 8 this morning and about 30 minutes after taking the medication the same symptoms occurred.  Patient had a chest CT in the ED on 07/13/14 and it was normal.  She denies any chest pains accompanied with SOB.   Review of Systems Negative except shortness of breath. Noted by pt primarily after taking pain pills. Pt taking 2 pills percocet During hospital stay pt remained bradycardic,pulse in 50-60s with normal O2 saturations.  She has been eating a regular diet without difficulty.   Bowel movements are normal. The patient is not having any pain.  Objective:  BP 140/96  Ht 5\' 4"  (1.626 m)  Wt 322 lb (146.058 kg)  BMI 55.24 kg/m2  LMP 06/19/2014 General:Well developed, well nourished.  No acute distress.   Lungs clear  cor: RRR no S3 S4. Abdomen: Bowel sounds normal, soft, non-tender. Pelvic Exam:    External Genitalia:  Normal.    Vagina: Normal    Bimanual: Normal    Cervix: Normal    Uterus: Normal    Adnexa: Normal Cardiology: HR 64 Incision(s):   Healing well, no drainage,  no erythema, no hernia, no swelling, no dehiscence, incision well approximated.  2 lbs increase in weight from pre-op weight   Assessment:  Post-Op 3 days s/p vaginal hysterectomy, bilateral oophorectomy and bilateral salpingectomy  Doing well postoperatively. Possible transient SOB from fluid shifts, though lungs are clear to CT and to auscultation.   Plan:  1. Start taking Lasix and continue any current medications 2. Follow up in 3 days. 3. D/c percocet 4. Add tylenol #3. For mild pain

## 2014-07-13 NOTE — Progress Notes (Deleted)
Patient ID: [name], female   DOB: 05/01/1961, 53 y.o.   MRN: [id number]     Subjective:  [name] is a 53 y.o. female who presents to the clinic {1-10:13787} weeks status post {gyn surgeries:13997}.   *** Review of Systems Negative except ***  She has been eating a regular diet {with-without:5700} difficulty.   Bowel movements are {normal/abnormal***:19619}. {pain control:13522::"The patient is not having any pain."}  Objective:  BP 140/96  Ht 5\' 4"  (1.626 m)  Wt 322 lb (146.058 kg)  BMI 55.24 kg/m2  LMP 06/19/2014 General:Well developed, well nourished.  No acute distress. Abdomen: Bowel sounds normal, soft, non-tender. Pelvic Exam:    External Genitalia:  Normal.    Vagina: Normal    Bimanual: Normal    Cervix: ***Normal    Uterus: ***Normal    Adnexa: ***Normal  Incision(s):   Healing well, no drainage, no erythema, no hernia, no swelling, no dehiscence, incision well approximated.***   Assessment:  Post-Op {1-10:13787} weeks s/p {gyn surgeries:13997}   *** Doing well postoperatively.   Plan:  1.Wound care discussed  *** 2. .Continue any current medications. 3. Activity restrictions: {restrictions:13723} 4. return to work: {work return:14002}. 5. Follow up in {1-10:13787} {time; units:18646}.

## 2014-07-13 NOTE — Telephone Encounter (Signed)
[name], from call a nurse, states pt c/o symptoms of shortness of breath throughout the night went to [facility] ER chest x-ray done and CT revealed a kidney stone. Pt O2 Sat was at 90% per pt daughter when leaving hospital. Pt feeling better but now symptoms of shortness of breath are reoccurring. Pt is taking oxycodone for pain and feel shortness of breath could be related due to never taking pain meds. Pt to come in this morning to be evaluated by Dr. Glo Herring. Advised if symptoms worsen before her 9:45 am appt with Dr. Glo Herring to go to ER.

## 2014-07-13 NOTE — Discharge Instructions (Signed)
Your xrays shows that you have no blood clots, no pneumonia and no other lung or heart problems.  You do have a large kidney stone in your right kidney and you should see a urologist about this in routine follow up - your family doctor can refer you.  Please discuss your results with your doctor and follow up in next couple of days for a recheck.

## 2014-07-13 NOTE — ED Provider Notes (Signed)
CSN: [id number]     Arrival date & time 07/13/14  0350 History   First MD Initiated Contact with Patient 07/13/14 0354     Chief Complaint  Patient presents with  . Shortness of Breath     (Consider location/radiation/quality/duration/timing/severity/associated sxs/prior Treatment) HPI Comments: The pt is a 53 y/o obese female who underwent at [facility] / bilateral salpingoopherectomy - the d/c summary from the EMR reports that while she was in the hospital (2 days) she had uneventful surgery with "moderate estimated blood loss" and had an isolated brief asymptomatic period of bradycardia - she was d/c'd uneventfully yesterday and states that she has been SOB during the hospitalization and tonight - she states it is worse at night - associated with a feeling of needing to cough but is not bringing up phlegm or having fevers - no tob use, no immobilization and no swelling of the legs - she did have surgery in last 3 days.  She also denies cancer or hormone use.  There is no CP assocaited with the SOB. On arrival the pt's sx are mild to gone  Patient is a 53 y.o. female presenting with shortness of breath. The history is provided by the patient, a relative and medical records.  Shortness of Breath   Past Medical History  Diagnosis Date  . Hypertension   . Obesity   . Sickle cell trait   . Asthma     pt says she hasn't had issues with asthma in over a year  . Sleep apnea     Stop Bang score of 4   Past Surgical History  Procedure Laterality Date  . Back surgery    . Wrist surgery    . Tubal ligation    . Colonoscopy N/A 07/21/2013    Procedure: COLONOSCOPY;  Surgeon: Danie Binder, MD;  Location: [facility];  Service: Endoscopy;  Laterality: N/A;  10:15 AM  . Cyst excision Right     knee  . Abdominal hysterectomy N/A 07/10/2014    Procedure: HYSTERECTOMY ABDOMINAL;  Surgeon: Jonnie Kind, MD;  Location: [facility];  Service: Gynecology;  Laterality: N/A;  . Salpingoophorectomy Bilateral  07/10/2014    Procedure: SALPINGO OOPHORECTOMY;  Surgeon: Jonnie Kind, MD;  Location: [facility];  Service: Gynecology;  Laterality: Bilateral;   Family History  Problem Relation Age of Onset  . Hypertension Mother   . Sickle cell trait Mother   . Heart attack Father   . Sickle cell anemia Sister   . Sickle cell trait Sister    History  Substance Use Topics  . Smoking status: Never Smoker   . Smokeless tobacco: Never Used  . Alcohol Use: No   OB History   Grav Para Term Preterm Abortions TAB SAB Ect Mult Living                 Review of Systems  Respiratory: Positive for shortness of breath.   All other systems reviewed and are negative.     Allergies  Review of patient's allergies indicates no known allergies.  Home Medications   Prior to Admission medications   Medication Sig Start Date End Date Taking? Authorizing Provider  albuterol (PROVENTIL HFA;VENTOLIN HFA) 108 (90 BASE) MCG/ACT inhaler Inhale 2 puffs into the lungs every 6 (six) hours as needed. For shortness of breath     Historical Provider, MD  aspirin 325 MG tablet Take 325 mg by mouth daily.    Historical Provider, MD  cholecalciferol (VITAMIN  D) 1000 UNITS tablet Take 1,000 Units by mouth daily.      Historical Provider, MD  docusate sodium 100 MG CAPS Take 100 mg by mouth 2 (two) times daily. 07/12/14   Jonnie Kind, MD  fish oil-omega-3 fatty acids 1000 MG capsule Take 1 g by mouth 2 (two) times daily.    Historical Provider, MD  ibuprofen (ADVIL,MOTRIN) 200 MG tablet Take 400 mg by mouth every 6 (six) hours as needed. For headaches     Historical Provider, MD  lisinopril-hydrochlorothiazide (PRINZIDE,ZESTORETIC) 20-12.5 MG per tablet Take 1 tablet by mouth daily.      Historical Provider, MD  oxyCODONE-acetaminophen (PERCOCET/ROXICET) 5-325 MG per tablet Take 1-2 tablets by mouth every 4 (four) hours as needed for severe pain (moderate to severe pain (when tolerating fluids)). 07/12/14   Jonnie Kind,  MD  vitamin C (ASCORBIC ACID) 500 MG tablet Take 500 mg by mouth daily.      Historical Provider, MD   BP 143/100  Pulse 59  Temp(Src) 98.2 F (36.8 C) (Oral)  Resp 16  Ht 5\' 4"  (1.626 m)  Wt 320 lb (145.151 kg)  BMI 54.90 kg/m2  SpO2 100%  LMP 06/19/2014 Physical Exam  Nursing note and vitals reviewed. Constitutional: She appears well-developed and well-nourished. No distress.  HENT:  Head: Normocephalic and atraumatic.  Mouth/Throat: Oropharynx is clear and moist. No oropharyngeal exudate.  Eyes: Conjunctivae and EOM are normal. Pupils are equal, round, and reactive to light. Right eye exhibits no discharge. Left eye exhibits no discharge. No scleral icterus.  Neck: Normal range of motion. Neck supple. No JVD present. No thyromegaly present.  Cardiovascular: Normal rate, regular rhythm, normal heart sounds and intact distal pulses.  Exam reveals no gallop and no friction rub.   No murmur heard. Normal heart rate, no murmurs, normal pulses at radial and pedal pulses  Pulmonary/Chest: Effort normal and breath sounds normal. No respiratory distress. She has no wheezes. She has no rales.  Lungs clear, no inc WOB, no rales, no acc muscle use - speaks in full sentences, looks comfortable.  Abdominal: Soft. Bowel sounds are normal. She exhibits no distension and no mass. There is no tenderness.  Musculoskeletal: Normal range of motion. She exhibits no edema and no tenderness.  Lymphadenopathy:    She has no cervical adenopathy.  Neurological: She is alert. Coordination normal.  Skin: Skin is warm and dry. No rash noted. No erythema.  Psychiatric: She has a normal mood and affect. Her behavior is normal.    ED Course  Procedures (including critical care time) Labs Review Labs Reviewed  CBC - Abnormal; Notable for the following:    Hemoglobin 11.2 (*)    HCT 32.9 (*)    All other components within normal limits  BASIC METABOLIC PANEL - Abnormal; Notable for the following:     Potassium 3.5 (*)    Glucose, Bld 125 (*)    All other components within normal limits  D-DIMER, QUANTITATIVE - Abnormal; Notable for the following:    D-Dimer, Quant 9.53 (*)    All other components within normal limits    Imaging Review Dg Chest 2 View  07/13/2014   CLINICAL DATA:  Shortness of breath.  EXAM: CHEST  2 VIEW  COMPARISON:  Chest radiograph October 25, 2011  FINDINGS: The heart size and mediastinal contours are within normal limits. Both lungs are clear. The visualized skeletal structures are nonsuspicious, large body habitus.  IMPRESSION: No acute cardiopulmonary process.  Electronically Signed   By: Elon Alas   On: 07/13/2014 05:06   Ct Angio Chest Pe W/cm &/or Wo Cm  07/13/2014   CLINICAL DATA:  Shortness of breath. Recent surgery. Evaluate for pulmonary embolism.  EXAM: CT ANGIOGRAPHY CHEST WITH CONTRAST  TECHNIQUE: Multidetector CT imaging of the chest was performed using the standard protocol during bolus administration of intravenous contrast. Multiplanar CT image reconstructions and MIPs were obtained to evaluate the vascular anatomy.  CONTRAST:  183mL OMNIPAQUE IOHEXOL 350 MG/ML SOLN  COMPARISON:  None.  FINDINGS: THORACIC INLET/BODY WALL:  No acute abnormality.  MEDIASTINUM:  Normal heart size. No pericardial effusion. No aortic aneurysm or dissection. There are multiple factors which decreased sensitivity for detecting pulmonary embolism, including bolus dispersion, patient size, and intermittent respiratory motion. No pulmonary artery filling defect is identified, with confidence to the level of the proximal segmental pulmonary arteries.  LUNG WINDOWS:  Expiratory phase imaging. No evidence of consolidation, edema, effusion, or air leak.  UPPER ABDOMEN:  Branching lamellated calculus within the right kidney. There is right renal cortical thinning. 16 mm low attenuation in the left spleen, nonspecific but usually benign. There is a roughly 2 cm area of low-attenuation  in the lower pole left kidney which is partly visualized, density favors cyst.  OSSEOUS:  Visualized portions of a partially imaged right supraspinatus or dorsal deltoid lipoma are simple.  Review of the MIP images confirms the above findings.  IMPRESSION: 1. No evidence of pulmonary embolism. 2. 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 Outpatient urology referral recommended.   Electronically Signed   By: Jorje Guild M.D.   On: 07/13/2014 06:08     EKG Interpretation   Date/Time:  Friday July 13 2014 04:01:48 EDT Ventricular Rate:  74 PR Interval:  166 QRS Duration: 101 QT Interval:  470 QTC Calculation: 521 R Axis:   14 Text Interpretation:  Sinus rhythm Borderline T wave abnormalities  Abnormal ekg since last tracing no significant change Confirmed by Uday Jantz   MD, Murray Durrell ([id number]) on 07/13/2014 4:18:41 AM      MDM   Final diagnoses:  Shortness of breath  Staghorn kidney stones    The pt appears comforatble, normal vs, eval for symptomatic anemia - cxr pending, labs pending, check d dimer.  VS normal  CXR, las and CT angio normal - pt reassured - stable for d/c. n o hypoxia or tachycardia here  Filed Vitals:   07/13/14 0405 07/13/14 0502 07/13/14 0505  BP: 153/98 143/100   Pulse: 82 59   Temp: 98.2 F (36.8 C)    TempSrc: Oral    Resp: 20 16   Height: 5\' 4"  (1.626 m)    Weight: 320 lb (145.151 kg)    SpO2: 100% 90% 100%     Johnna Acosta, MD 07/13/14 [phone]

## 2014-07-17 ENCOUNTER — Encounter: Payer: Self-pay | Admitting: Obstetrics and Gynecology

## 2014-07-17 ENCOUNTER — Ambulatory Visit (INDEPENDENT_AMBULATORY_CARE_PROVIDER_SITE_OTHER): Payer: BC Managed Care – PPO | Admitting: Obstetrics and Gynecology

## 2014-07-17 VITALS — BP 128/90 | Ht 64.0 in | Wt 315.0 lb

## 2014-07-17 DIAGNOSIS — Z9889 Other specified postprocedural states: Secondary | ICD-10-CM

## 2014-07-17 NOTE — Progress Notes (Signed)
Patient ID: [name], female   DOB: 1961/07/05, 53 y.o.   MRN: [id number] Pt denies any problems or concerns at this time. Subjective:   1   [name] is a 53 y.o. female who presents to the clinic 1 weeks status post total abdominal hysterectomy and bilateral oophorectomy for fibriods pelvic pain. Diet:       regular without difficulty. Bowel function is: normal. Pain:     Pain is controlled with current analgesics. Medications being used: narcotic analgesics including tylenol .#3.  afebrile  Review of Systems     Objective:    BP 128/90  Ht 5\' 4"  (1.626 m)  Wt 315 lb (142.883 kg)  BMI 54.04 kg/m2  LMP 06/19/2014 General:  alert and no distress  Abdomen: soft, bowel sounds active, non-tender  Incision:   healing well, no drainage, no erythema, no hernia, no seroma, no swelling, well approximated, light bit of drainage, no dehiscence, incision well approximated       Pelvic:not this visit    Assessment:    Doing well postoperatively. Operative findings again reviewed. Pathology report discussed.    Plan:    1. Continue any current medications. 2. Wound care discussed. 3. Activity restrictions: may drive loacally 4. Anticipated return to work: not applicable. 5. Follow up: 2 weeks for  Release to RTW.

## 2014-08-06 ENCOUNTER — Ambulatory Visit (INDEPENDENT_AMBULATORY_CARE_PROVIDER_SITE_OTHER): Payer: Self-pay | Admitting: Obstetrics and Gynecology

## 2014-08-06 ENCOUNTER — Encounter: Payer: Self-pay | Admitting: Obstetrics and Gynecology

## 2014-08-06 VITALS — BP 112/76 | Ht 64.0 in | Wt 309.0 lb

## 2014-08-06 DIAGNOSIS — Z9079 Acquired absence of other genital organ(s): Principal | ICD-10-CM

## 2014-08-06 DIAGNOSIS — Z9889 Other specified postprocedural states: Secondary | ICD-10-CM

## 2014-08-06 DIAGNOSIS — Z90710 Acquired absence of both cervix and uterus: Secondary | ICD-10-CM

## 2014-08-06 DIAGNOSIS — Z90722 Acquired absence of ovaries, bilateral: Principal | ICD-10-CM

## 2014-08-06 MED ORDER — METRONIDAZOLE 500 MG PO TABS: 500.0000 mg | ORAL_TABLET | Freq: Two times a day (BID) | ORAL | Status: DC

## 2014-08-06 NOTE — Progress Notes (Signed)
Patient ID: [name], female   DOB: [birth year], 53 y.o.   MRN: [id number] Pt here today for post op visit. Pt denies any problems at this time.    This chart was scribed by Erling Conte, Medical Scribe, for Dr. Mallory Shirk on 08/06/14 at 9:30 AM. This chart was reviewed by Dr. Mallory Shirk for accuracy.    Subjective:  [name] is a 53 y.o. female who presents to the clinic 4 weeks status post total abdominal hysterectomy and bilateralsalpingectomy. Pt states she went to the ED recently and she has a kidney stone.  Pt reports no pain or complications at this time.  Review of Systems Negative except for recent kidney stone dx and mild vaginal discharge  She has been eating a regular diet without difficulty.   Bowel movements are normal. There is no pain  Objective:  BP 112/76  Ht 5\' 4"  (1.626 m)  Wt 140.161 kg (309 lb)  BMI 53.01 kg/m2  LMP 06/19/2014 General:Well developed, well nourished.  No acute distress. Abdomen: Bowel sounds normal, soft, non-tender. Pelvic Exam:    External Genitalia:  Normal.    Vagina: Normal    Bimanual: Normal    Cervix: absent    Uterus: absent    Adnexa: absent  Incision(s):   Healing well, no drainage, no erythema, no hernia, no swelling, no dehiscence, incision well approximated.   Assessment:  Post-Op 4 weeks s/p total abdominal hysterectomy and bilateral salpingectomy.   Doing well postoperatively. Trichomoniasis present, discussed with patient.    Plan:  1.Wound care discussed  bandaids 2. .Continue any current medications. 3. Activity restrictions: none 4. return to work: now. 5. Follow up in 3 weeks.

## 2014-08-06 NOTE — Patient Instructions (Signed)

## 2014-08-27 ENCOUNTER — Ambulatory Visit: Payer: BC Managed Care – PPO | Admitting: Obstetrics and Gynecology

## 2014-08-29 ENCOUNTER — Encounter: Payer: Self-pay | Admitting: Obstetrics and Gynecology

## 2014-08-29 ENCOUNTER — Ambulatory Visit (INDEPENDENT_AMBULATORY_CARE_PROVIDER_SITE_OTHER): Payer: BC Managed Care – PPO | Admitting: Obstetrics and Gynecology

## 2014-08-29 VITALS — BP 100/62 | Ht 64.0 in | Wt 309.8 lb

## 2014-08-29 DIAGNOSIS — A599 Trichomoniasis, unspecified: Secondary | ICD-10-CM | POA: Insufficient documentation

## 2014-08-29 LAB — POCT WET PREP (WET MOUNT)

## 2014-08-29 NOTE — Addendum Note (Signed)
Addended by: Doyne Keel on: 08/29/2014 04:48 PM   Modules accepted: Orders

## 2014-08-29 NOTE — Progress Notes (Signed)
[facility] Visit  Patient name: [name] MRN [id number]  Date of birth: 1961-11-29  CC & HPI:  [name] is a 53 y.o. female presenting today for a F/U for a POC of Trichomoniasis dx on 08/06/14. She states that she has been married for 34 years. She states that she contracted Trich from her husband. She voices concern for a kidney stone. She states that she would like to know if she will need another doctor for the kidney stone.    ROS:  No other complaints.   Pertinent History Reviewed:   Reviewed: Significant for  Medical         Past Medical History  Diagnosis Date  . Hypertension   . Obesity   . Sickle cell trait   . Asthma     pt says she hasn't had issues with asthma in over a year  . Sleep apnea     Stop Bang score of 4  . Kidney stone                               Surgical Hx:    Past Surgical History  Procedure Laterality Date  . Back surgery    . Wrist surgery    . Tubal ligation    . Colonoscopy N/A 07/21/2013    Procedure: COLONOSCOPY;  Surgeon: Danie Binder, MD;  Location: [facility];  Service: Endoscopy;  Laterality: N/A;  10:15 AM  . Cyst excision Right     knee  . Abdominal hysterectomy N/A 07/10/2014    Procedure: HYSTERECTOMY ABDOMINAL;  Surgeon: Jonnie Kind, MD;  Location: [facility];  Service: Gynecology;  Laterality: N/A;  . Salpingoophorectomy Bilateral 07/10/2014    Procedure: SALPINGO OOPHORECTOMY;  Surgeon: Jonnie Kind, MD;  Location: [facility];  Service: Gynecology;  Laterality: Bilateral;   Medications: Reviewed & Updated - see associated section                      Current outpatient prescriptions:aspirin 325 MG tablet, Take 325 mg by mouth daily., Disp: , Rfl: ;  cholecalciferol (VITAMIN D) 1000 UNITS tablet, Take 1,000 Units by mouth daily.  , Disp: , Rfl: ;  fish oil-omega-3 fatty acids 1000 MG capsule, Take 1 g by mouth 2 (two) times daily., Disp: , Rfl: ;  ibuprofen (ADVIL,MOTRIN) 200 MG tablet, Take 400 mg by  mouth every 6 (six) hours as needed. For headaches , Disp: , Rfl:  lisinopril-hydrochlorothiazide (PRINZIDE,ZESTORETIC) 20-12.5 MG per tablet, Take 1 tablet by mouth daily.  , Disp: , Rfl: ;  metroNIDAZOLE (FLAGYL) 500 MG tablet, Take 1 tablet (500 mg total) by mouth 2 (two) times daily., Disp: 14 tablet, Rfl: 1;  acetaminophen-codeine (TYLENOL #3) 300-30 MG per tablet, Take 1-2 tablets by mouth every 4 (four) hours as needed for moderate pain., Disp: 30 tablet, Rfl: 0 albuterol (PROVENTIL HFA;VENTOLIN HFA) 108 (90 BASE) MCG/ACT inhaler, Inhale 2 puffs into the lungs every 6 (six) hours as needed. For shortness of breath , Disp: , Rfl: ;  furosemide (LASIX) 20 MG tablet, Take 1 tablet (20 mg total) by mouth daily., Disp: 10 tablet, Rfl: 0;  vitamin C (ASCORBIC ACID) 500 MG tablet, Take 500 mg by mouth daily.  , Disp: , Rfl:    Social History: Reviewed -  reports that she has never smoked. She has never used smokeless tobacco.  Objective Findings:  Vitals:  Blood pressure 100/62, height 5\' 4"  (1.626 m), weight 309 lb 12.8 oz (140.524 kg), last menstrual period 06/19/2014.  Physical Examination:  Pelvic - normal external genitalia, vulva, vagina, cervix, uterus and adnexa,  VULVA: normal appearing vulva with no masses, tenderness or lesions,  VAGINA: normal appearing vagina with normal color and discharge, no lesions,  WET MOUNT: epithelial cells present, otherwise negative    Assessment & Plan:   A:  1. Resolved Trichomoniasis .   P:  1. F/u PRN    This chart was scribed by Steva Colder, Medical Scribe, for Dr. Mallory Shirk on 08/29/14 at 4:20 PM. This chart was reviewed by Dr. Mallory Shirk for accuracy.

## 2015-08-01 DIAGNOSIS — Z029 Encounter for administrative examinations, unspecified: Secondary | ICD-10-CM

## 2015-08-10 IMAGING — CT CT ANGIO CHEST
2 of 7 series · 7 of 36 positions shown · IV contrast (Omnipaque 300)
Comparison: None.

CLINICAL DATA: Shortness of breath. Recent surgery. Evaluate for
pulmonary embolism.

EXAM:
CT ANGIOGRAPHY CHEST WITH CONTRAST
TECHNIQUE: Multidetector CT imaging of the chest was performed using the
standard protocol during bolus administration of intravenous
contrast. Multiplanar CT image reconstructions and MIPs were
obtained to evaluate the vascular anatomy.
CONTRAST:  100mL OMNIPAQUE IOHEXOL 350 MG/ML SOLN

[Series 4: pe 3.0 b40f · axial · 0.76mm/px · z∈[-244,-72]mm · 4 of 96 slices shown]
[im 20/96  lung]
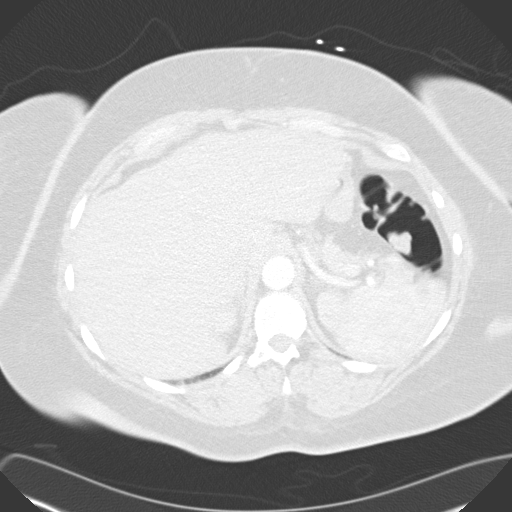
[im 39/96  mediastinal]
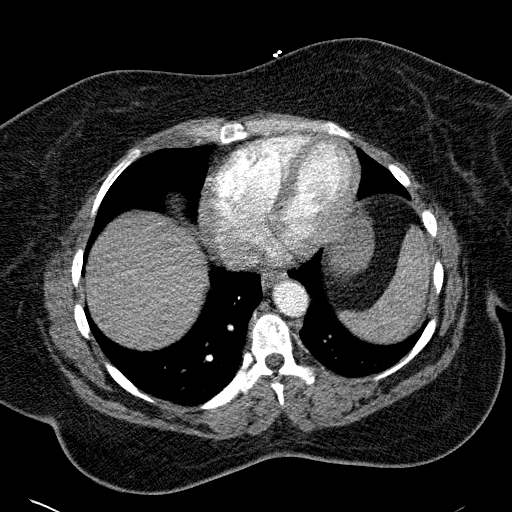
[im 58/96  lung]
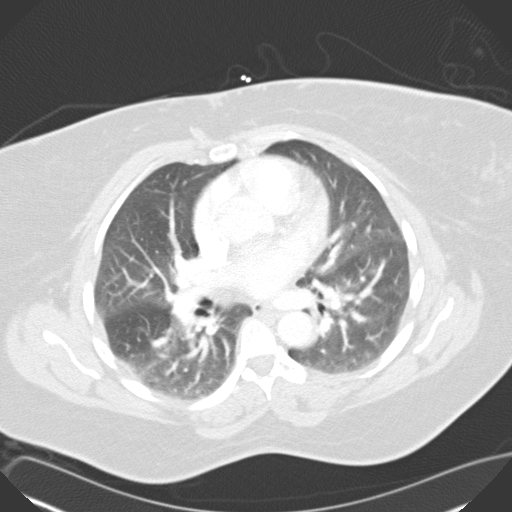
[im 77/96  mediastinal]
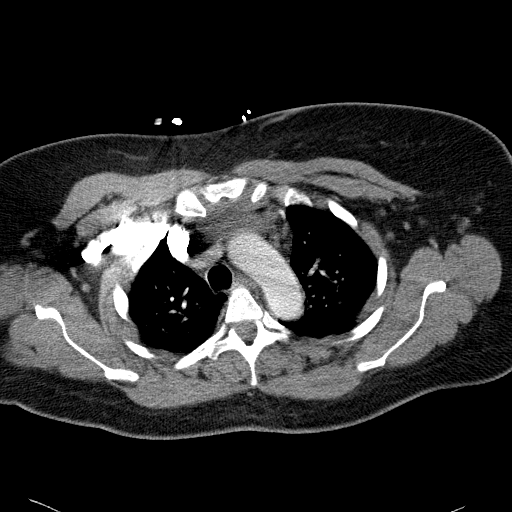

[Series 9: pe 3.0 b70f · axial · 0.76mm/px · z∈[-192,-76]mm · 3 of 79 slices shown]
[im 20/79  lung]
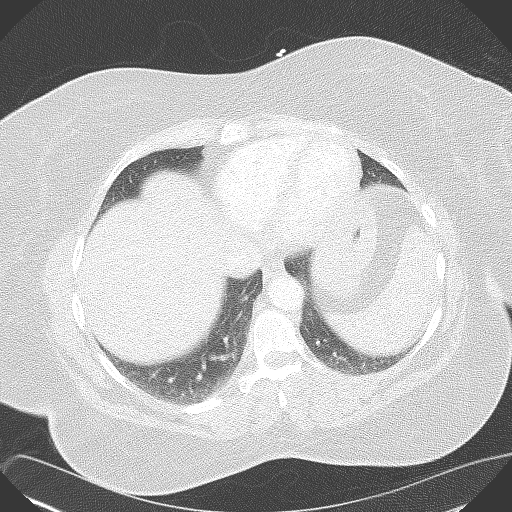
[im 40/79  lung]
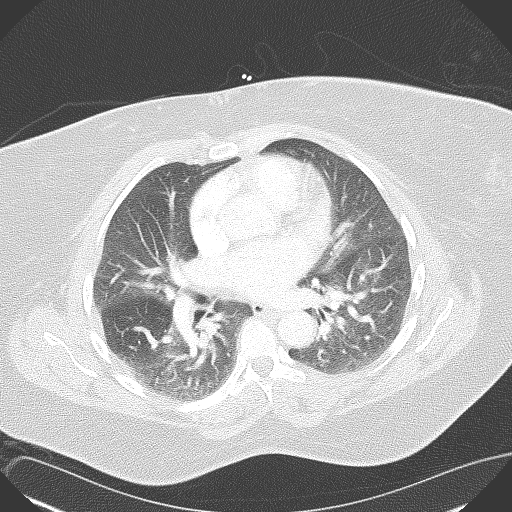
[im 59/79  lung]
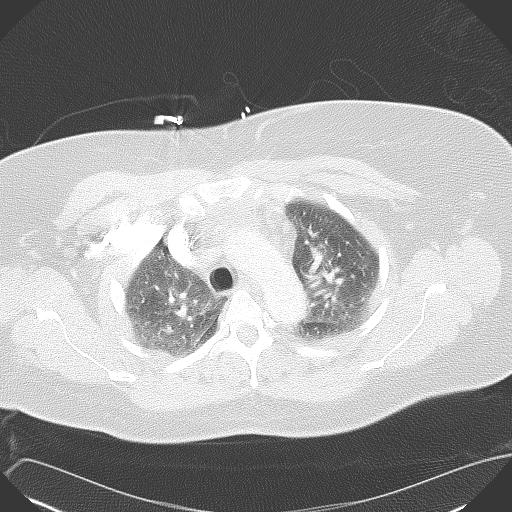

[7 of 36 positions shown; findings below may reference images not displayed]

FINDINGS: THORACIC INLET/BODY WALL:

No acute abnormality.

MEDIASTINUM:

Normal heart size. No pericardial effusion. No aortic aneurysm or
dissection. There are multiple factors which decreased sensitivity
for detecting pulmonary embolism, including bolus dispersion,
patient size, and intermittent respiratory motion. No pulmonary
artery filling defect is identified, with confidence to the level of
the proximal segmental pulmonary arteries.

LUNG WINDOWS:

Expiratory phase imaging. No evidence of consolidation, edema,
effusion, or air leak.

UPPER ABDOMEN:

Branching lamellated calculus within the right kidney. There is
right renal cortical thinning. 16 mm low attenuation in the left
spleen, nonspecific but usually benign. There is a roughly 2 cm area
of low-attenuation in the lower pole left kidney which is partly
visualized, density favors cyst.

OSSEOUS:

Visualized portions of a partially imaged right supraspinatus or
dorsal deltoid lipoma are simple.

Review of the MIP images confirms the above findings.
IMPRESSION: 1. No evidence of pulmonary embolism.
2. Staghorn calculus in the right kidney. Outpatient urology
referral recommended.

## 2015-09-08 ENCOUNTER — Emergency Department (HOSPITAL_COMMUNITY)
Admission: EM | Admit: 2015-09-08 | Discharge: 2015-09-08 | Disposition: A | Discharge status: Home / Self Care | Payer: BLUE CROSS/BLUE SHIELD | Attending: Emergency Medicine | Admitting: Emergency Medicine

## 2015-09-08 ENCOUNTER — Encounter (HOSPITAL_COMMUNITY): Payer: Self-pay | Admitting: Emergency Medicine

## 2015-09-08 ENCOUNTER — Emergency Department (HOSPITAL_COMMUNITY)
Admission: EM | Admit: 2015-09-08 | Discharge: 2015-09-08 | Discharge status: Against Medical Advice | Payer: BLUE CROSS/BLUE SHIELD | Attending: Emergency Medicine | Admitting: Emergency Medicine

## 2015-09-08 DIAGNOSIS — G4730 Sleep apnea, unspecified: Secondary | ICD-10-CM | POA: Diagnosis not present

## 2015-09-08 DIAGNOSIS — M25512 Pain in left shoulder: Secondary | ICD-10-CM | POA: Insufficient documentation

## 2015-09-08 DIAGNOSIS — J45909 Unspecified asthma, uncomplicated: Secondary | ICD-10-CM | POA: Diagnosis not present

## 2015-09-08 DIAGNOSIS — R51 Headache: Secondary | ICD-10-CM | POA: Insufficient documentation

## 2015-09-08 DIAGNOSIS — R202 Paresthesia of skin: Secondary | ICD-10-CM | POA: Diagnosis not present

## 2015-09-08 DIAGNOSIS — R200 Anesthesia of skin: Secondary | ICD-10-CM | POA: Insufficient documentation

## 2015-09-08 DIAGNOSIS — Z7982 Long term (current) use of aspirin: Secondary | ICD-10-CM | POA: Diagnosis not present

## 2015-09-08 DIAGNOSIS — M5412 Radiculopathy, cervical region: Secondary | ICD-10-CM | POA: Insufficient documentation

## 2015-09-08 DIAGNOSIS — Z862 Personal history of diseases of the blood and blood-forming organs and certain disorders involving the immune mechanism: Secondary | ICD-10-CM | POA: Insufficient documentation

## 2015-09-08 DIAGNOSIS — E669 Obesity, unspecified: Secondary | ICD-10-CM | POA: Diagnosis not present

## 2015-09-08 DIAGNOSIS — I10 Essential (primary) hypertension: Secondary | ICD-10-CM | POA: Diagnosis not present

## 2015-09-08 DIAGNOSIS — Z79899 Other long term (current) drug therapy: Secondary | ICD-10-CM | POA: Diagnosis not present

## 2015-09-08 DIAGNOSIS — Z792 Long term (current) use of antibiotics: Secondary | ICD-10-CM | POA: Insufficient documentation

## 2015-09-08 DIAGNOSIS — Z87442 Personal history of urinary calculi: Secondary | ICD-10-CM | POA: Diagnosis not present

## 2015-09-08 DIAGNOSIS — M542 Cervicalgia: Secondary | ICD-10-CM | POA: Diagnosis present

## 2015-09-08 MED ORDER — HYDROCODONE-ACETAMINOPHEN 5-325 MG PO TABS: 1.0000 | ORAL_TABLET | Freq: Four times a day (QID) | ORAL | Status: DC | PRN

## 2015-09-08 NOTE — ED Notes (Signed)
Pt states understanding of care given and follow up instructions.  To follow up with PCP to get an outpt MRI

## 2015-09-08 NOTE — ED Notes (Signed)
Pt reports does not want to wait. Pt reports "i will go home and lay down." nad noted. Pt LWBS after triage.

## 2015-09-08 NOTE — ED Notes (Signed)
Pt reports left shoulder pain. Pt reports seen orthopaedic and had cortisone injection. Pt reports continued pain radiating to left arm and left side of neck. Pt reports left sided facial numbness,headache x2 days. nad noted. Pt alert and oriented. Airway patent. Speech clear.

## 2015-09-08 NOTE — Discharge Instructions (Signed)
Cervical Radiculopathy Take ibuprofen for mild pain or the pain medicine prescribed for bad pain. Contact your primary care physician for a follow-up visit and to schedule further outpatient testing which may include an MRI of your neck Cervical radiculopathy happens when a nerve in the neck is pinched or bruised by a slipped (herniated) disk or by arthritic changes in the bones of the cervical spine. This can occur due to an injury or as part of the normal aging process. Pressure on the cervical nerves can cause pain or numbness that runs from your neck all the way down into your arm and fingers. CAUSES  There are many possible causes, including:  Injury.  Muscle tightness in the neck from overuse.  Swollen, painful joints (arthritis).  Breakdown or degeneration in the bones and joints of the spine (spondylosis) due to aging.  Bone spurs that may develop near the cervical nerves. SYMPTOMS  Symptoms include pain, weakness, or numbness in the affected arm and hand. Pain can be severe or irritating. Symptoms may be worse when extending or turning the neck. DIAGNOSIS  Your caregiver will ask about your symptoms and do a physical exam. He or she may test your strength and reflexes. X-rays, CT scans, and MRI scans may be needed in cases of injury or if the symptoms do not go away after a period of time. Electromyography (EMG) or nerve conduction testing may be done to study how your nerves and muscles are working. TREATMENT  Your caregiver may recommend certain exercises to help relieve your symptoms. Cervical radiculopathy can, and often does, get better with time and treatment. If your problems continue, treatment options may include:  Wearing a soft collar for short periods of time.  Physical therapy to strengthen the neck muscles.  Medicines, such as nonsteroidal anti-inflammatory drugs (NSAIDs), oral corticosteroids, or spinal injections.  Surgery. Different types of surgery may be done  depending on the cause of your problems. HOME CARE INSTRUCTIONS   Put ice on the affected area.  Put ice in a plastic bag.  Place a towel between your skin and the bag.  Leave the ice on for 15-20 minutes, 03-04 times a day or as directed by your caregiver.  If ice does not help, you can try using heat. Take a warm shower or bath, or use a hot water bottle as directed by your caregiver.  You may try a gentle neck and shoulder massage.  Use a flat pillow when you sleep.  Only take over-the-counter or prescription medicines for pain, discomfort, or fever as directed by your caregiver.  If physical therapy was prescribed, follow your caregiver's directions.  If a soft collar was prescribed, use it as directed. SEEK IMMEDIATE MEDICAL CARE IF:   Your pain gets much worse and cannot be controlled with medicines.  You have weakness or numbness in your hand, arm, face, or leg.  You have a high fever or a stiff, rigid neck.  You lose bowel or bladder control (incontinence).  You have trouble with walking, balance, or speaking. MAKE SURE YOU:   Understand these instructions.  Will watch your condition.  Will get help right away if you are not doing well or get worse. Document Released: 09/08/2001 Document Revised: 03/07/2012 Document Reviewed: 07/28/2011 [facility] Patient Information 2015 [place], [place]. This information is not intended to replace advice given to you by your health care provider. Make sure you discuss any questions you have with your health care provider.

## 2015-09-08 NOTE — ED Provider Notes (Signed)
CSN: [id number]     Arrival date & time 09/08/15  1758 History   First MD Initiated Contact with Patient 09/08/15 1842     Chief Complaint  Patient presents with  . Shoulder Pain     (Consider location/radiation/quality/duration/timing/severity/associated sxs/prior Treatment) HPI Plans of left-sided neck pain and left shoulder pain rating to left hand onset gradually one year ago, becoming worse over the past 2 days. Pain is worse with abducting her shoulder improved with holding shoulder in adducted and flexed position. She was seen approximately a year ago by her primary care physician and treated with a cortisone injection, without relief. She's also been taking ibuprofen, without relief. She denies other associated symptoms no chest pain no fever no trauma. Past Medical History  Diagnosis Date  . Hypertension   . Obesity   . Sickle cell trait   . Asthma     pt says she hasn't had issues with asthma in over a year  . Sleep apnea     Stop Bang score of 4  . Kidney stone    Past Surgical History  Procedure Laterality Date  . Back surgery    . Wrist surgery    . Tubal ligation    . Colonoscopy N/A 07/21/2013    Procedure: COLONOSCOPY;  Surgeon: Danie Binder, MD;  [facility];  Service: Endoscopy;  Laterality: N/A;  10:15 AM  . Cyst excision Right     knee  . Abdominal hysterectomy N/A 07/10/2014    Procedure: HYSTERECTOMY ABDOMINAL;  Surgeon: Jonnie Kind, MD;  Location: [facility];  Service: Gynecology;  Laterality: N/A;  . Salpingoophorectomy Bilateral 07/10/2014    Procedure: SALPINGO OOPHORECTOMY;  Surgeon: Jonnie Kind, MD;  Location: [facility];  Service: Gynecology;  Laterality: Bilateral;   Family History  Problem Relation Age of Onset  . Hypertension Mother   . Sickle cell trait Mother   . Heart attack Father   . Sickle cell anemia Sister   . Sickle cell trait Sister    Social History  Substance Use Topics  . Smoking status: Never Smoker   . Smokeless  tobacco: Never Used  . Alcohol Use: No   OB History    No data available     Review of Systems  Constitutional: Negative.   HENT: Negative.   Respiratory: Negative.   Cardiovascular: Negative.   Gastrointestinal: Negative.   Musculoskeletal: Positive for arthralgias.  Skin: Negative.   Neurological: Negative.        Tingling and left cheek for the past several days  Psychiatric/Behavioral: Negative.   All other systems reviewed and are negative.     Allergies  Review of patient's allergies indicates no known allergies.  Home Medications   Prior to Admission medications   Medication Sig Start Date End Date Taking? Authorizing Provider  aspirin 325 MG tablet Take 325 mg by mouth daily.   Yes Historical Provider, MD  cholecalciferol (VITAMIN D) 1000 UNITS tablet Take 1,000 Units by mouth daily.     Yes Historical Provider, MD  diphenhydramine-acetaminophen (TYLENOL PM) 25-500 MG TABS Take 1 tablet by mouth at bedtime as needed (Pain, Sleep).   Yes Historical Provider, MD  fish oil-omega-3 fatty acids 1000 MG capsule Take 1 g by mouth 2 (two) times daily.   Yes Historical Provider, MD  ibuprofen (ADVIL,MOTRIN) 200 MG tablet Take 800 mg by mouth every 6 (six) hours as needed for headache or mild pain. For headaches   Yes Historical Provider,  MD  lisinopril-hydrochlorothiazide (PRINZIDE,ZESTORETIC) 20-12.5 MG per tablet Take 1 tablet by mouth daily.     Yes Historical Provider, MD  vitamin C (ASCORBIC ACID) 500 MG tablet Take 500 mg by mouth daily.     Yes Historical Provider, MD  acetaminophen-codeine (TYLENOL #3) 300-30 MG per tablet Take 1-2 tablets by mouth every 4 (four) hours as needed for moderate pain. 07/13/14   Jonnie Kind, MD  furosemide (LASIX) 20 MG tablet Take 1 tablet (20 mg total) by mouth daily. 07/13/14   Jonnie Kind, MD  metroNIDAZOLE (FLAGYL) 500 MG tablet Take 1 tablet (500 mg total) by mouth 2 (two) times daily. 08/06/14   Jonnie Kind, MD   BP 124/72  mmHg  Pulse 73  Temp(Src) 98.1 F (36.7 C) (Oral)  Resp 18  Ht 5\' 4"  (1.626 m)  Wt 310 lb (140.615 kg)  BMI 53.19 kg/m2  SpO2 100%  LMP 06/19/2014 Physical Exam  Constitutional: She appears well-developed and well-nourished.  HENT:  Head: Normocephalic and atraumatic.  Facial asymmetry  Eyes: Conjunctivae are normal. Pupils are equal, round, and reactive to light.  Neck: Neck supple. No tracheal deviation present. No thyromegaly present.  Cardiovascular: Normal rate and regular rhythm.   No murmur heard. Pulmonary/Chest: Effort normal and breath sounds normal.  Abdominal: Soft. Bowel sounds are normal. She exhibits no distension. There is no tenderness.  Musculoskeletal: Normal range of motion. She exhibits no edema or tenderness.  Left upper extremity no redness no swelling or deformity. She has pain  overlying left trapezius muscle when she abducts her shoulder. Radial pulse 2+. Full range of motion. All other extremity is without redness or tenderness neurovascular intact  Neurological: She is alert. Coordination normal.  Skin: Skin is warm and dry. No rash noted.  Psychiatric: She has a normal mood and affect.  Nursing note and vitals reviewed.   ED Course  Procedures (including critical care time) Labs Review Labs Reviewed - No data to display  Imaging Review No results found. I have personally reviewed and evaluated these images and lab results as part of my medical decision-making.   EKG Interpretation None      MDM   Acute imaging not indicated discussed with patient who agrees. Plan prescription Norco. She is follow-up with PMD, please told her she may need an MRi, which can be ordered as outpatient Final diagnoses:  None   dx #1 cervical radiculopathy #2 paresthesias     Orlie Dakin, MD 09/08/15 1921

## 2015-09-08 NOTE — ED Notes (Addendum)
Pt reports left shoulder pain. Pt reports has been seen by PCP for same and received a cortisone injection. Pt reports continued pain. Pt also reports left sided facial,neck, left arm numbness x2 days. Pt denies any known injury.pt reports took 800mg  ibuprofen with no relief.

## 2015-09-10 MED FILL — Hydrocodone-Acetaminophen Tab 5-325 MG: ORAL | Qty: 6 | Status: AC

## 2015-09-16 ENCOUNTER — Other Ambulatory Visit (HOSPITAL_COMMUNITY): Payer: Self-pay | Admitting: Physician Assistant

## 2015-09-16 DIAGNOSIS — Z1231 Encounter for screening mammogram for malignant neoplasm of breast: Secondary | ICD-10-CM

## 2015-09-30 ENCOUNTER — Ambulatory Visit (HOSPITAL_COMMUNITY): Payer: BLUE CROSS/BLUE SHIELD

## 2015-10-07 ENCOUNTER — Ambulatory Visit (HOSPITAL_COMMUNITY)
Admission: RE | Admit: 2015-10-07 | Discharge: 2015-10-07 | Disposition: A | Discharge status: Home / Self Care | Payer: BLUE CROSS/BLUE SHIELD | Source: Ambulatory Visit | Attending: Physician Assistant | Admitting: Physician Assistant

## 2015-10-07 DIAGNOSIS — Z1231 Encounter for screening mammogram for malignant neoplasm of breast: Secondary | ICD-10-CM | POA: Diagnosis present

## 2015-10-16 ENCOUNTER — Ambulatory Visit (HOSPITAL_COMMUNITY): Payer: BLUE CROSS/BLUE SHIELD | Attending: Sports Medicine | Admitting: Occupational Therapy

## 2015-10-16 ENCOUNTER — Encounter (HOSPITAL_COMMUNITY): Payer: Self-pay | Admitting: Occupational Therapy

## 2015-10-16 DIAGNOSIS — M6281 Muscle weakness (generalized): Secondary | ICD-10-CM | POA: Diagnosis present

## 2015-10-16 DIAGNOSIS — M25612 Stiffness of left shoulder, not elsewhere classified: Secondary | ICD-10-CM | POA: Diagnosis present

## 2015-10-16 DIAGNOSIS — M7502 Adhesive capsulitis of left shoulder: Secondary | ICD-10-CM | POA: Insufficient documentation

## 2015-10-16 DIAGNOSIS — M25512 Pain in left shoulder: Secondary | ICD-10-CM | POA: Insufficient documentation

## 2015-10-16 DIAGNOSIS — M542 Cervicalgia: Secondary | ICD-10-CM | POA: Diagnosis present

## 2015-10-16 DIAGNOSIS — M75102 Unspecified rotator cuff tear or rupture of left shoulder, not specified as traumatic: Secondary | ICD-10-CM | POA: Insufficient documentation

## 2015-10-16 DIAGNOSIS — M7542 Impingement syndrome of left shoulder: Secondary | ICD-10-CM

## 2015-10-16 NOTE — Patient Instructions (Signed)
  AROM: Lateral Neck Flexion   Slowly tilt head toward one shoulder, then the other.  Repeat _10-15___ times per set. Do __1__ sets per session. Do __2__ sessions per day.  http://orth.exer.us/296   Copyright  VHI. All rights reserved.  AROM: Neck Extension   Bend head backward.  Repeat __10-15__ times per set. Do __1__ sets per session. Do _2___ sessions per day.  http://orth.exer.us/300   Copyright  VHI. All rights reserved.  AROM: Neck Flexion   Bend head forward.  Repeat _10-15___ times per set. Do __1__ sets per session. Do _2___ sessions per day.  http://orth.exer.us/298   Copyright  VHI. All rights reserved.  AROM: Neck Rotation   Turn head slowly to look over one shoulder, then the other.  Repeat _10-15___ times per set. Do __1__ sets per session. Do __2_ sessions per day.  http://orth.exer.us/294   Copyright  VHI. All rights reserved.      SHOULDER: Flexion On Table   Place hands on table, elbows straight. Move hips away from body. Press hands down into table. __10-15_ reps per set, __2_ sets per day  Abduction (Passive)   With arm out to side, resting on table, lower head toward arm, keeping trunk away from table. Repeat _10-15___ times. Do _2___ sessions per day.  Copyright  VHI. All rights reserved.     Internal Rotation (Assistive)   Seated with elbow bent at right angle and held against side, slide arm on table surface in an inward arc. Repeat _10-15___ times. Do _2___ sessions per day. Activity: Use this motion to brush crumbs off the table.  Copyright  VHI. All rights reserved.

## 2015-10-16 NOTE — Therapy (Addendum)
[place], [place], [address] Phone: [phone]   Fax:  [phone]  Occupational Therapy Evaluation  Patient Details  Name: [name] MRN: [id number] Date of Birth: [birth year] Referring Provider: Drema Dallas  Encounter Date: 10/16/2015      OT End of Session - 10/16/15 1540    Visit Number 1   Number of Visits 8   Date for OT Re-Evaluation 12/15/15  mini-reassessment 11/14/2015   Authorization Type BCBS Visit limit 30   OT Start Time 1440   OT Stop Time 1518   OT Time Calculation (min) 38 min   Activity Tolerance Patient tolerated treatment well   Behavior During Therapy [facility] for tasks assessed/performed      Past Medical History  Diagnosis Date  . Hypertension   . Obesity   . Sickle cell trait ([place])   . Asthma     pt says she hasn't had issues with asthma in over a year  . Sleep apnea     Stop Bang score of 4  . Kidney stone     Past Surgical History  Procedure Laterality Date  . Back surgery    . Wrist surgery    . Tubal ligation    . Colonoscopy N/A 07/21/2013    Procedure: COLONOSCOPY;  Surgeon: Danie Binder, MD;  [facility];  Service: Endoscopy;  Laterality: N/A;  10:15 AM  . Cyst excision Right     knee  . Abdominal hysterectomy N/A 07/10/2014    Procedure: HYSTERECTOMY ABDOMINAL;  Surgeon: Jonnie Kind, MD;  Location: [facility];  Service: Gynecology;  Laterality: N/A;  . Salpingoophorectomy Bilateral 07/10/2014    Procedure: SALPINGO OOPHORECTOMY;  Surgeon: Jonnie Kind, MD;  Location: [facility];  Service: Gynecology;  Laterality: Bilateral;    There were no vitals filed for this visit.  Visit Diagnosis:  Shoulder stiffness, left  Secondary adhesive capsulitis of left shoulder  Pain in left shoulder  Muscle weakness of left upper extremity  Rotator cuff impingement syndrome of left shoulder      Subjective Assessment - 10/16/15 1530    Subjective  S: Pt is a  54 y/o female presenting with left shoulder stiffness, secondary adhesive capsulitis, chronic bursitis, rotator cuff impingment, causing increased pain in left shoulder and into neck. Pt reports pain began approximately 6-8 months ago and she is not taking anything for the pain and has not tried ice or heat. Pt was referred to occupational therapy for evaluation and treatment by Dr. Drema Dallas.    Special Tests FOTO Score: 41/100 (59% impairment)   Patient Stated Goals to be able to use my arm without pain   Currently in Pain? Yes   Pain Score 2    Pain Location Shoulder   Pain Orientation Left   Pain Descriptors / Indicators Aching   Pain Type Acute pain           [facility] OT Assessment - 10/16/15 1444    Assessment   Diagnosis left shoulder stiffness   Referring Provider Drema Dallas   Onset Date --  6-8 months ago   Prior Therapy none   Precautions   Precautions None   Balance Screen   Has the patient fallen in the past 6 months No   Has the patient had a decrease in activity level because of a fear of falling?  No   Is the patient reluctant to leave their home because of a fear of falling?  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 Pt is able to complete all ADL tasks, however is experiencing increased pain with shoulder use during ADL tasks.    Cognition   Overall Cognitive Status Within Functional Limits for tasks assessed   ROM / Strength   AROM / PROM / Strength AROM;PROM;Strength   Palpation   Palpation comment Pt has moderate fascial restrictions in left upper arm, trapezius and scapularis regions.    AROM    Overall AROM Comments Assessed seated, ER/IR adducted   AROM Assessment Site Shoulder;Cervical   Right/Left Shoulder Left   Left Shoulder Flexion 105 Degrees   Left Shoulder ABduction 119 Degrees   Left Shoulder Internal Rotation 65 Degrees   Left Shoulder External Rotation 71 Degrees   Cervical Flexion 20   Cervical Extension 65   Cervical - Right Side Bend 45   Cervical - Left Side Bend 45   Cervical - Right Rotation 95   Cervical - Left Rotation 60   PROM   Overall PROM Comments Assessed supine, ER/IR adducted  pt with max difficulty relaxing shoulder   PROM Assessment Site Shoulder   Right/Left Shoulder Left   Left Shoulder Flexion 95 Degrees   Left Shoulder ABduction 110 Degrees   Left Shoulder Internal Rotation 65 Degrees   Left Shoulder External Rotation 90 Degrees   Strength   Strength Assessment Site Shoulder   Right/Left Shoulder Left   Left Shoulder Flexion 3/5   Left Shoulder ABduction 3/5   Left Shoulder Internal Rotation 3+/5   Left Shoulder External Rotation 3+/5                           OT Short Term Goals - 10/16/15 1547    OT SHORT TERM GOAL #1   Title Pt will be educated on and independent in HEP.    Time 4   Period Weeks   Status New   OT SHORT TERM GOAL #2   Title Pt will return to prior level of functioning and independence in ADL and work tasks.    Time 4   Period Weeks   Status New   OT SHORT TERM GOAL #3   Title Pt will decrease pain to 2/10 or less during daily tasks.    Time 4   Period Weeks   Status New   OT SHORT TERM GOAL #4   Title Pt will decrease fascial restrictions from mod to min amounts or less.    Time 4   Period Weeks   Status New   OT SHORT TERM GOAL #5   Title Pt will increase AROM to [facility] to increase ability to reach into high cabinets.    Time 4   Period Weeks   Status New   Additional Short Term Goals   Additional Short Term Goals Yes   OT SHORT TERM GOAL #6   Title Pt will increase strength to  4/5 to increase ability to lift lightweight objects.    Time 4   Period Weeks   Status New  Plan - 10/16/15 1541    Clinical Impression Statement A: Pt is a 54 y/o female presenting with shoulder stiffness causing increased pain, fascial restrictions, decreased range of motion and strength limiting ability to complete ADL, work, and leisure tasks at highest level of functioning. Pt is also having difficulty with neck range of motion due to left shoulder pain moving into neck and head. Pt is able to complete ADL tasks, however experiences increased pain with use of LUE. Provided pt with table slides and cervical A/ROM HEP.    Pt will benefit from skilled therapeutic intervention in order to improve on the following deficits (Retired) Decreased strength;Pain;Impaired UE functional use;Decreased range of motion;Increased fascial restricitons;Impaired flexibility   Rehab Potential Good   OT Frequency 2x / week   OT Duration 4 weeks   OT Treatment/Interventions Self-care/ADL training;Passive range of motion;Patient/family education;Electrical Stimulation;Moist Heat;Therapeutic exercise;Manual Therapy;Therapeutic activities   Plan P: Pt would benefit from skilled occupational therapy to decrease pain and fasical restrictions, increase range of motion and strength, and increase overall functional use of LUE. Treatment plan: myofascial release, manual therapy, P/ROM, AA/ROM, A/ROM, proximal shoulder strengthening, cervical ROM exercises, general LUE strengthening.    OT Home Exercise Plan table slides, cervical A/ROM   Consulted and Agree with Plan of Care Patient        Problem List Patient Active Problem List   Diagnosis Date Noted  . Trichomoniasis 08/29/2014  . Status post total hysterectomy and bilateral salpingo-oophorectomy 07/13/2014  . S/P hysterectomy with oophorectomy 07/10/2014  . H N P-LUMBAR 02/24/2010  . BACK PAIN 02/24/2010  . HIGH BLOOD PRESSURE  02/20/2010    Guadelupe Sabin, OTR/L  [phone]  10/16/2015, 3:50 PM  [facility] [address] [place], [place], [address] Phone: [phone]   Fax:  [phone]  Name: [name] MRN: [id number] Date of Birth: 02-Feb-1961

## 2015-10-17 ENCOUNTER — Ambulatory Visit (HOSPITAL_COMMUNITY): Payer: BLUE CROSS/BLUE SHIELD

## 2015-10-17 ENCOUNTER — Encounter (HOSPITAL_COMMUNITY): Payer: Self-pay

## 2015-10-17 DIAGNOSIS — M6281 Muscle weakness (generalized): Secondary | ICD-10-CM

## 2015-10-17 DIAGNOSIS — M25612 Stiffness of left shoulder, not elsewhere classified: Secondary | ICD-10-CM

## 2015-10-17 DIAGNOSIS — M25512 Pain in left shoulder: Secondary | ICD-10-CM

## 2015-10-17 NOTE — Therapy (Signed)
[place], [place], [address] Phone: [phone]   Fax:  [phone]  Occupational Therapy Treatment  Patient Details  Name: [name] MRN: [id number] Date of Birth: 09-22-1961 Referring Provider: Drema Dallas  Encounter Date: 10/17/2015      OT End of Session - 10/17/15 1841    Visit Number 2   Number of Visits 8   Date for OT Re-Evaluation 12/15/15  mini-reassessment 11/14/2015   Authorization Type BCBS   OT Start Time 1745   OT Stop Time 1830   OT Time Calculation (min) 45 min   Activity Tolerance Patient tolerated treatment well   Behavior During Therapy [facility] for tasks assessed/performed      Past Medical History  Diagnosis Date  . Hypertension   . Obesity   . Sickle cell trait ([place])   . Asthma     pt says she hasn't had issues with asthma in over a year  . Sleep apnea     Stop Bang score of 4  . Kidney stone     Past Surgical History  Procedure Laterality Date  . Back surgery    . Wrist surgery    . Tubal ligation    . Colonoscopy N/A 07/21/2013    Procedure: COLONOSCOPY;  Surgeon: Danie Binder, MD;  [facility];  Service: Endoscopy;  Laterality: N/A;  10:15 AM  . Cyst excision Right     knee  . Abdominal hysterectomy N/A 07/10/2014    Procedure: HYSTERECTOMY ABDOMINAL;  Surgeon: Jonnie Kind, MD;  Location: [facility];  Service: Gynecology;  Laterality: N/A;  . Salpingoophorectomy Bilateral 07/10/2014    Procedure: SALPINGO OOPHORECTOMY;  Surgeon: Jonnie Kind, MD;  Location: [facility];  Service: Gynecology;  Laterality: Bilateral;    There were no vitals filed for this visit.  Visit Diagnosis:  Pain in left shoulder  Muscle weakness of left upper extremity  Shoulder stiffness, left      Subjective Assessment - 10/17/15 1815    Subjective  S: My shoulder is hurting and it runs all up my neck   Currently in Pain? Yes   Pain Score 5    Pain Location Shoulder   Pain  Orientation Left   Pain Descriptors / Indicators Aching   Pain Type Acute pain            [facility] OT Assessment - 10/17/15 1817    Assessment   Diagnosis left shoulder stiffness   Precautions   Precautions None                  OT Treatments/Exercises (OP) - 10/17/15 1817    Exercises   Exercises Shoulder;Neck   Shoulder Exercises: Supine   Protraction PROM;AAROM;10 reps   Horizontal ABduction PROM;AAROM;10 reps   External Rotation PROM;AAROM;10 reps   Internal Rotation PROM;AAROM;10 reps   Flexion PROM;AAROM;10 reps   ABduction PROM;10 reps   Manual Therapy   Manual Therapy Myofascial release;Manual Traction   Myofascial Release Myofascial release and manual stretching to left upper arm, trapezius, scapularis and bilateral cervical region to decrease fascial restrictions and increase joint mobility in a pain free zone.    Manual Traction Cervical manual traction completed this session.                 OT Education - 10/17/15 1840    Education provided Yes   Education Details Continue with set HEP and use moist heat to relieve muscle tightness.  Recommended using heat prior to exercises/stretches.   Person(s) Educated Patient   Methods Explanation   Comprehension Verbalized understanding          OT Short Term Goals - 10/17/15 1816    OT SHORT TERM GOAL #1   Title Pt will be educated on and independent in [place].    Status On-going   OT SHORT TERM GOAL #2   Title Pt will return to prior level of functioning and independence in ADL and work tasks.    Status On-going   OT SHORT TERM GOAL #3   Title Pt will decrease pain to 2/10 or less during daily tasks.    Status On-going   OT SHORT TERM GOAL #4   Title Pt will decrease fascial restrictions from mod to min amounts or less.    Status On-going   OT SHORT TERM GOAL #5   Title Pt will increase AROM to [facility] to increase ability to reach into high cabinets.    Status On-going   OT SHORT TERM GOAL #6    Title Pt will increase strength to 4/5 to increase ability to lift lightweight objects.    Status On-going                  Plan - 10/17/15 1842    Clinical Impression Statement A: Initiated mtofascial release to left shoulder and cervical region. patient was found with increased trigger points and muscle tension starting at left deltoid and radiating up to anterior cervical region. patient also presents with increased trigger points in right upper trapezius region. Manual cervical traction completed with patient voicing some relief. Patient had great response to manual therapy overall.    Plan P: Print off OT evaluation and review goals. Cont with myofascial release to left UE and bilateral cervical regions. Cont with supine AA/ROM and attempt seated/standing AA/ROM.        Problem List Patient Active Problem List   Diagnosis Date Noted  . Trichomoniasis 08/29/2014  . Status post total hysterectomy and bilateral salpingo-oophorectomy 07/13/2014  . S/P hysterectomy with oophorectomy 07/10/2014  . H N P-LUMBAR 02/24/2010  . BACK PAIN 02/24/2010  . HIGH BLOOD PRESSURE 02/20/2010    Ailene Ravel, OTR/L,CBIS  [phone]  10/17/2015, 6:45 PM  [place] [address] [place], [place], [address] Phone: [phone]   Fax:  [phone]  Name: [name] MRN: [id number] Date of Birth: Jul 15, 1961

## 2015-10-23 ENCOUNTER — Ambulatory Visit (HOSPITAL_COMMUNITY): Payer: BLUE CROSS/BLUE SHIELD | Admitting: Specialist

## 2015-10-23 DIAGNOSIS — M6281 Muscle weakness (generalized): Secondary | ICD-10-CM

## 2015-10-23 DIAGNOSIS — M25612 Stiffness of left shoulder, not elsewhere classified: Secondary | ICD-10-CM | POA: Diagnosis not present

## 2015-10-23 DIAGNOSIS — M25512 Pain in left shoulder: Secondary | ICD-10-CM

## 2015-10-23 DIAGNOSIS — M542 Cervicalgia: Secondary | ICD-10-CM

## 2015-10-23 NOTE — Therapy (Signed)
[facility] [address] [place], [place], [address] Phone: [phone]   Fax:  [phone]  Occupational Therapy Treatment  Patient Details  Name: [name] MRN: [id number] Date of Birth: 1961/02/16 Referring Provider: Drema Dallas  Encounter Date: 10/23/2015      OT End of Session - 10/23/15 1547    Visit Number 3   Number of Visits 8   Date for OT Re-Evaluation 12/15/15  mini reassess 11/14/15   Authorization Type BCBS   Authorization Time Period Visit limit 30   Authorization - Visit Number 2   Authorization - Number of Visits 30   OT Start Time 1440   OT Stop Time 1531   OT Time Calculation (min) 51 min   Activity Tolerance Patient tolerated treatment well   Behavior During Therapy [facility] for tasks assessed/performed      Past Medical History  Diagnosis Date  . Hypertension   . Obesity   . Sickle cell trait ([place])   . Asthma     pt says she hasn't had issues with asthma in over a year  . Sleep apnea     Stop Bang score of 4  . Kidney stone     Past Surgical History  Procedure Laterality Date  . Back surgery    . Wrist surgery    . Tubal ligation    . Colonoscopy N/A 07/21/2013    Procedure: COLONOSCOPY;  Surgeon: Danie Binder, MD;  [facility];  Service: Endoscopy;  Laterality: N/A;  10:15 AM  . Cyst excision Right     knee  . Abdominal hysterectomy N/A 07/10/2014    Procedure: HYSTERECTOMY ABDOMINAL;  Surgeon: Jonnie Kind, MD;  Location: [facility];  Service: Gynecology;  Laterality: N/A;  . Salpingoophorectomy Bilateral 07/10/2014    Procedure: SALPINGO OOPHORECTOMY;  Surgeon: Jonnie Kind, MD;  Location: [facility];  Service: Gynecology;  Laterality: Bilateral;    There were no vitals filed for this visit.  Visit Diagnosis:  Cervicalgia  Pain in left shoulder  Muscle weakness of left upper extremity  Shoulder stiffness, left      Subjective Assessment - 10/23/15 1444    Subjective  S:  I  did some dancing this weekend and  I think  I irritated it.   Currently in Pain? Yes   Pain Score 6    Pain Location Shoulder   Pain Orientation Left   Pain Descriptors / Indicators Aching   Pain Type Acute pain            [facility] OT Assessment - 10/23/15 0001    Assessment   Diagnosis left shoulder stiffness   Precautions   Precautions None                  OT Treatments/Exercises (OP) - 10/23/15 0001    Exercises   Exercises Shoulder;Neck   Neck Exercises: Stretches   Upper Trapezius Stretch 1 rep;10 seconds   Levator Stretch 1 rep;10 seconds   Levator Stretch Limitations difficult for left arm positioning   Lower Cervical/Upper Thoracic Stretch 1 rep;10 seconds   Other Neck Stretches mid thoracic stretch 1 rep, 10 seconds   Other Neck Stretches scalenes stretch 1 rep, 10 seconds    Neck Exercises: Standing   Other Standing Exercises nerve glide for ulnar nerve    Shoulder Exercises: Supine   Protraction PROM;10 reps;AAROM;12 reps   Horizontal ABduction PROM;5 reps;AAROM;12 reps   External Rotation PROM;5 reps;AAROM;12 reps  Internal Rotation PROM;5 reps;AAROM;12 reps   Flexion PROM;5 reps;AAROM;12 reps   ABduction PROM;5 reps;AAROM;12 reps   Manual Therapy   Manual Therapy Myofascial release;Manual Traction   Myofascial Release Myofascial release and manual stretching to left upper arm, trapezius, scapularis and bilateral cervical region to decrease fascial restrictions and increase joint mobility in a pain free zone.    Manual Traction Cervical manual traction completed this session.                 OT Education - 10/23/15 1547    Education provided Yes   Education Details Educated patient on HEP for neck stretches and nerve glides.  see attached pictures.  Also recommended neck hang off side of bed for elongation/traction   Person(s) Educated Patient   Methods Explanation;Demonstration;Handout   Comprehension Verbalized understanding;Returned  demonstration          OT Short Term Goals - 10/17/15 1816    OT SHORT TERM GOAL #1   Title Pt will be educated on and independent in HEP.    Status On-going   OT SHORT TERM GOAL #2   Title Pt will return to prior level of functioning and independence in ADL and work tasks.    Status On-going   OT SHORT TERM GOAL #3   Title Pt will decrease pain to 2/10 or less during daily tasks.    Status On-going   OT SHORT TERM GOAL #4   Title Pt will decrease fascial restrictions from mod to min amounts or less.    Status On-going   OT SHORT TERM GOAL #5   Title Pt will increase AROM to [facility] to increase ability to reach into high cabinets.    Status On-going   OT SHORT TERM GOAL #6   Title Pt will increase strength to 4/5 to increase ability to lift lightweight objects.    Status On-going                  Plan - 10/23/15 1548    Clinical Impression Statement A:  Patient states her headaches are worsening and centralized at base of skull.  OTR/L provided HEP for neck and upper back stretches as well as nerve glides for numbness she is feeling in left hand.  Patient educated on stretches and completed in clinic with therapist.  Patient very sensitive to MFR and manual therapy techniques in cervical region.     Plan P:  Share/educate patient on OT goals and treatment plan.  Follow up on newly issued HEP.  if cervical pain not subsided in next 3 sessions inititate mechanical cervical traction.        Problem List Patient Active Problem List   Diagnosis Date Noted  . Trichomoniasis 08/29/2014  . Status post total hysterectomy and bilateral salpingo-oophorectomy 07/13/2014  . S/P hysterectomy with oophorectomy 07/10/2014  . H N P-LUMBAR 02/24/2010  . BACK PAIN 02/24/2010  . HIGH BLOOD PRESSURE 02/20/2010    [name], OTR/L [phone]  10/23/2015, 3:55 PM  [facility] [address] [place], [place], [address] Phone:  [phone]   Fax:  [phone]  Name: [name] MRN: [id number] Date of Birth: Jun 27, 1961

## 2015-10-23 NOTE — Patient Instructions (Addendum)
Scalene Stretch, Sitting  Repeat __10_ times per session. Do _1-2__ sessions per day.  Copyright  VHI. All rights reserved.  Scalene Stretch, Sitting   Sit, one hand tucked under hip on side to be stretched, other hand over top of head. Gently pull head to side and backwards. Hold ___ seconds. For more stretch, lean body in direction of head pull. Repeat ___ times per session. Do ___ sessions per day.  Copyright  VHI. All rights reserved.  Side Bend, Standing   Stand, one hand grasping other arm above wrist behind body. Pull down while gently tilting head toward pulling side. Hold ___ seconds. Repeat ___ times per session. Do ___ sessions per day.  Copyright  VHI. All rights reserved.  Levator Scapula Stretch, Sitting   Sit, one hand on same-side shoulder blade, other hand on head. Gently pull head down and away. Hold ___ seconds. Repeat ___ times per session. Do ___ sessions per day.  Copyright  VHI. All rights reserved.  Lower Cervical / Upper Thoracic Stretch   Clasp hands together in front with arms extended. Gently pull shoulder blades apart and bend head forward. Hold ____ seconds. Repeat ____ times per set. Do ____ sets per session. Do ____ sessions per day.  http://orth.exer.us/354   Copyright  VHI. All rights reserved.  Axial Extension (Chin Tuck)   Pull chin in and lengthen back of neck. Hold ____ seconds while counting out loud. Repeat ____ times. Do ____ sessions per day.  http://gt2.exer.us/449   Copyright  VHI. All rights reserved.  Scalene Stretch, Sitting  Repeat __10_ times per session. Do _1-2__ sessions per day.  Copyright  VHI. All rights reserved.  Scalene Stretch, Sitting   Sit, one hand tucked under hip on side to be stretched, other hand over top of head. Gently pull head to side and backwards. Hold ___ seconds. For more stretch, lean body in direction of head pull. Repeat ___ times per session. Do ___ sessions per  day.  Copyright  VHI. All rights reserved.  Side Bend, Standing   Stand, one hand grasping other arm above wrist behind body. Pull down while gently tilting head toward pulling side. Hold ___ seconds. Repeat ___ times per session. Do ___ sessions per day.  Copyright  VHI. All rights reserved.  Levator Scapula Stretch, Sitting   Sit, one hand on same-side shoulder blade, other hand on head. Gently pull head down and away. Hold ___ seconds. Repeat ___ times per session. Do ___ sessions per day.  Copyright  VHI. All rights reserved.  Lower Cervical / Upper Thoracic Stretch   Clasp hands together in front with arms extended. Gently pull shoulder blades apart and bend head forward. Hold ____ seconds. Repeat ____ times per set. Do ____ sets per session. Do ____ sessions per day.  http://orth.exer.us/354   Copyright  VHI. All rights reserved.  Axial Extension (Chin Tuck)   Pull chin in and lengthen back of neck. Hold ____ seconds while counting out loud. Repeat ____ times. Do ____ sessions per day.  http://gt2.exer.us/449   Copyright  VHI. All rights reserved.  Flexibility: Upper Trapezius Stretch    Gently grasp right side of head while reaching behind back with other hand. Tilt head away until a gentle stretch is felt. Hold ____ seconds. Repeat ____ times per set. Do ____ sets per session. Do ____ sessions per day.  http://orth.exer.us/341   Copyright  VHI. All rights reserved.

## 2015-10-28 ENCOUNTER — Telehealth (HOSPITAL_COMMUNITY): Payer: Self-pay

## 2015-10-28 ENCOUNTER — Ambulatory Visit (HOSPITAL_COMMUNITY): Payer: BLUE CROSS/BLUE SHIELD

## 2015-10-28 NOTE — Telephone Encounter (Signed)
Date: 10/28/15  Called patient regarding missed therapy appt today. Patient was unaware that she had missed. Pt states that she put her appt in for Tuesday instead of Monday. Pt also informed therapist that she will be unable to make Wednesday's appt as she has a work meeting that she cannot miss. Pt will be put on wait list if anything opens up she will be called.   Ailene Ravel, OTR/L,CBIS  [phone]

## 2015-10-30 ENCOUNTER — Encounter (HOSPITAL_COMMUNITY): Payer: BLUE CROSS/BLUE SHIELD | Admitting: Specialist

## 2015-11-04 ENCOUNTER — Ambulatory Visit (HOSPITAL_COMMUNITY): Payer: BLUE CROSS/BLUE SHIELD | Attending: Sports Medicine | Admitting: Occupational Therapy

## 2015-11-04 DIAGNOSIS — M6281 Muscle weakness (generalized): Secondary | ICD-10-CM | POA: Insufficient documentation

## 2015-11-04 DIAGNOSIS — M25612 Stiffness of left shoulder, not elsewhere classified: Secondary | ICD-10-CM | POA: Insufficient documentation

## 2015-11-04 DIAGNOSIS — M542 Cervicalgia: Secondary | ICD-10-CM | POA: Insufficient documentation

## 2015-11-04 DIAGNOSIS — M25512 Pain in left shoulder: Secondary | ICD-10-CM | POA: Insufficient documentation

## 2015-11-06 ENCOUNTER — Ambulatory Visit (HOSPITAL_COMMUNITY): Payer: BLUE CROSS/BLUE SHIELD | Admitting: Occupational Therapy

## 2015-11-06 ENCOUNTER — Telehealth (HOSPITAL_COMMUNITY): Payer: Self-pay | Admitting: Occupational Therapy

## 2015-11-06 NOTE — Telephone Encounter (Signed)
She has another MD apptment today and will come in next Wed.

## 2015-11-11 ENCOUNTER — Encounter (HOSPITAL_COMMUNITY): Payer: Self-pay

## 2015-11-11 ENCOUNTER — Ambulatory Visit (HOSPITAL_COMMUNITY): Payer: BLUE CROSS/BLUE SHIELD

## 2015-11-11 DIAGNOSIS — M6281 Muscle weakness (generalized): Secondary | ICD-10-CM | POA: Diagnosis present

## 2015-11-11 DIAGNOSIS — M542 Cervicalgia: Secondary | ICD-10-CM

## 2015-11-11 DIAGNOSIS — M25512 Pain in left shoulder: Secondary | ICD-10-CM | POA: Diagnosis present

## 2015-11-11 DIAGNOSIS — M25612 Stiffness of left shoulder, not elsewhere classified: Secondary | ICD-10-CM

## 2015-11-11 NOTE — Patient Instructions (Signed)
Complete 10-12 reps. 2-3X times a day.  1) Shoulder Protraction    Begin with elbows by your side, slowly "punch" straight out in front of you. Repeat ___times, ____set/day.     2) Shoulder Flexion  Supine:     Standing:         Begin with arms at your side with thumbs pointed up, slowly raise both arms up and forward towards overhead. Repeat___times, ___set/day.               3) Horizontal abduction/adduction  Supine:   Standing:           Begin with arms straight out in front of you, bring out to the side in at "T" shape. Keep arms straight entire time. Repeat ____times, ____sets/day.                 4) Internal & External Rotation    *No band* -Stand with elbows at the side and elbows bent 90 degrees. Move your forearms away from your body, then bring back inward toward the body.  Repeat ___times, ___sets/day    5) Shoulder Abduction  Supine:     Standing:       Lying on your back begin with your arms flat on the table next to your side. Slowly move your arms out to the side so that they go overhead, in a jumping jack or snow angel movement. Repeat ___times, ___sets/day

## 2015-11-11 NOTE — Therapy (Signed)
[facility] [place], [place], [address] Phone: [phone]   Fax:  [phone]  Occupational Therapy Treatment  Patient Details  Name: [name] MRN: [id number] Date of Birth: 1961/06/12 Referring Provider: Drema Dallas  Encounter Date: 11/11/2015      OT End of Session - 11/11/15 1621    Visit Number 4   Number of Visits 8   Date for OT Re-Evaluation 12/15/15  mini reassess 11/14/15   Authorization Type BCBS   Authorization Time Period Visit limit 30   Authorization - Visit Number 3   Authorization - Number of Visits 30   OT Start Time [id number]   OT Stop Time 1600   OT Time Calculation (min) 45 min   Activity Tolerance Patient tolerated treatment well   Behavior During Therapy [facility] for tasks assessed/performed      Past Medical History  Diagnosis Date  . Hypertension   . Obesity   . Sickle cell trait ([place])   . Asthma     pt says she hasn't had issues with asthma in over a year  . Sleep apnea     Stop Bang score of 4  . Kidney stone     Past Surgical History  Procedure Laterality Date  . Back surgery    . Wrist surgery    . Tubal ligation    . Colonoscopy N/A 07/21/2013    Procedure: COLONOSCOPY;  Surgeon: Danie Binder, MD;  [facility];  Service: Endoscopy;  Laterality: N/A;  10:15 AM  . Cyst excision Right     knee  . Abdominal hysterectomy N/A 07/10/2014    Procedure: HYSTERECTOMY ABDOMINAL;  Surgeon: Jonnie Kind, MD;  Location: [facility];  Service: Gynecology;  Laterality: N/A;  . Salpingoophorectomy Bilateral 07/10/2014    Procedure: SALPINGO OOPHORECTOMY;  Surgeon: Jonnie Kind, MD;  Location: [facility];  Service: Gynecology;  Laterality: Bilateral;    There were no vitals filed for this visit.  Visit Diagnosis:  Muscle weakness of left upper extremity  Shoulder stiffness, left  Neck pain      Subjective Assessment - 11/11/15 1547    Subjective  S: My shoulder is feeling pretty  good today but my neck is killing me.    Currently in Pain? Yes   Pain Score 6    Pain Location Neck   Pain Orientation Posterior   Pain Descriptors / Indicators Aching   Pain Type Acute pain            [facility] OT Assessment - 11/11/15 1549    Assessment   Diagnosis left shoulder stiffness   Precautions   Precautions None                  OT Treatments/Exercises (OP) - 11/11/15 1549    Exercises   Exercises Shoulder;Neck   Neck Exercises: Stretches   Upper Trapezius Stretch 1 rep;10 seconds   Lower Cervical/Upper Thoracic Stretch 1 rep;10 seconds   Shoulder Exercises: Supine   Protraction PROM;10 reps   Horizontal ABduction PROM;10 reps   External Rotation PROM;10 reps   Internal Rotation PROM;10 reps   Flexion PROM;10 reps   ABduction PROM;10 reps   Shoulder Exercises: Standing   Protraction AROM;10 reps   Horizontal ABduction AROM;10 reps   External Rotation AROM;10 reps   Internal Rotation AROM;10 reps   Flexion AROM;10 reps   ABduction AROM;10 reps   Manual Therapy   Manual Therapy Myofascial release;Manual Traction  Myofascial Release Myofascial release and manual stretching to left upper arm, trapezius, scapularis and bilateral cervical region to decrease fascial restrictions and increase joint mobility in a pain free zone.    Manual Traction Cervical manual traction completed this session.                 OT Education - 11/11/15 1621    Education provided Yes   Education Details Patient was given OT evaluation print out with goals highlighted. patient given A/ROM shoulder exercises.    Person(s) Educated Patient   Methods Explanation;Demonstration;Handout   Comprehension Returned demonstration;Verbalized understanding          OT Short Term Goals - 10/17/15 1816    OT SHORT TERM GOAL #1   Title Pt will be educated on and independent in HEP.    Status On-going   OT SHORT TERM GOAL #2   Title Pt will return to prior level of  functioning and independence in ADL and work tasks.    Status On-going   OT SHORT TERM GOAL #3   Title Pt will decrease pain to 2/10 or less during daily tasks.    Status On-going   OT SHORT TERM GOAL #4   Title Pt will decrease fascial restrictions from mod to min amounts or less.    Status On-going   OT SHORT TERM GOAL #5   Title Pt will increase AROM to [facility] to increase ability to reach into high cabinets.    Status On-going   OT SHORT TERM GOAL #6   Title Pt will increase strength to 4/5 to increase ability to lift lightweight objects.    Status On-going                  Plan - 11/11/15 1622    Clinical Impression Statement A: Patient completed shoulder and neck exercises this session as she reports increased neck pain at beginning of session. patient states that MD wrote an order for her neck although she forgot to bring it at date. Added A/ROM shoulder exercises standing and aded them to HEP.   Plan P: Mini reassess. *Should be noted that patient has only completed 4 session since initial evaluation. Add scapular theraband exercises.         Problem List Patient Active Problem List   Diagnosis Date Noted  . Trichomoniasis 08/29/2014  . Status post total hysterectomy and bilateral salpingo-oophorectomy 07/13/2014  . S/P hysterectomy with oophorectomy 07/10/2014  . H N P-LUMBAR 02/24/2010  . BACK PAIN 02/24/2010  . HIGH BLOOD PRESSURE 02/20/2010    Ailene Ravel, OTR/L,CBIS  [phone]  11/11/2015, 4:24 PM  [facility] [address] [place], [place], [address] Phone: [phone]   Fax:  [phone]  Name: [name] MRN: [id number] Date of Birth: Sep 29, 1961

## 2015-11-13 ENCOUNTER — Ambulatory Visit (HOSPITAL_COMMUNITY): Payer: BLUE CROSS/BLUE SHIELD | Admitting: Occupational Therapy

## 2015-11-13 ENCOUNTER — Encounter (HOSPITAL_COMMUNITY): Payer: Self-pay | Admitting: Occupational Therapy

## 2015-11-13 DIAGNOSIS — M6281 Muscle weakness (generalized): Secondary | ICD-10-CM | POA: Diagnosis not present

## 2015-11-13 DIAGNOSIS — M542 Cervicalgia: Secondary | ICD-10-CM

## 2015-11-13 DIAGNOSIS — M25512 Pain in left shoulder: Secondary | ICD-10-CM

## 2015-11-13 DIAGNOSIS — M25612 Stiffness of left shoulder, not elsewhere classified: Secondary | ICD-10-CM

## 2015-11-13 NOTE — Therapy (Signed)
[facility] [address] [place], [place], [address] Phone: [phone]   Fax:  [phone]  Occupational Therapy Reassessment and Treatment  Patient Details  Name: [name] MRN: [id number] Date of Birth: 1961-01-12 Referring Provider: Drema Dallas  Encounter Date: 11/13/2015      OT End of Session - 11/13/15 1620    Visit Number 5   Number of Visits 13   Date for OT Re-Evaluation 12/15/15   Authorization Type BCBS   Authorization Time Period Visit limit 30   Authorization - Visit Number 4   Authorization - Number of Visits 30   OT Start Time 1522   OT Stop Time 1609   OT Time Calculation (min) 47 min   Activity Tolerance Patient tolerated treatment well   Behavior During Therapy [facility] for tasks assessed/performed      Past Medical History  Diagnosis Date  . Hypertension   . Obesity   . Sickle cell trait ([place])   . Asthma     pt says she hasn't had issues with asthma in over a year  . Sleep apnea     Stop Bang score of 4  . Kidney stone     Past Surgical History  Procedure Laterality Date  . Back surgery    . Wrist surgery    . Tubal ligation    . Colonoscopy N/A 07/21/2013    Procedure: COLONOSCOPY;  Surgeon: Danie Binder, MD;  [facility];  Service: Endoscopy;  Laterality: N/A;  10:15 AM  . Cyst excision Right     knee  . Abdominal hysterectomy N/A 07/10/2014    Procedure: HYSTERECTOMY ABDOMINAL;  Surgeon: Jonnie Kind, MD;  Location: [facility];  Service: Gynecology;  Laterality: N/A;  . Salpingoophorectomy Bilateral 07/10/2014    Procedure: SALPINGO OOPHORECTOMY;  Surgeon: Jonnie Kind, MD;  Location: [facility];  Service: Gynecology;  Laterality: Bilateral;    There were no vitals filed for this visit.  Visit Diagnosis:  Muscle weakness of left upper extremity  Shoulder stiffness, left  Neck pain  Pain in left shoulder      Subjective Assessment - 11/13/15 1526    Subjective  S: I think my  shoulder is actually getting better.    Currently in Pain? Yes   Pain Score 4    Pain Location Shoulder   Pain Orientation Left   Pain Descriptors / Indicators Sore   Pain Type Acute pain           [facility] OT Assessment - 11/13/15 1541    Assessment   Diagnosis left shoulder stiffness   Precautions   Precautions None   AROM   Overall AROM Comments Assessed seated, ER/IR adducted   AROM Assessment Site Shoulder   Right/Left Shoulder Left   Left Shoulder Flexion 170 Degrees  previous 105   Left Shoulder ABduction 164 Degrees  previous 119   Left Shoulder Internal Rotation 90 Degrees  previous 65   Left Shoulder External Rotation 86 Degrees  previous 71   Cervical Flexion 60  previous 20   Cervical Extension 39  previous 65   Cervical - Right Side Bend 39  previous 45   Cervical - Left Side Bend 34  previous 45   Cervical - Right Rotation 75  previous 95   Cervical - Left Rotation 40  previous 60   PROM   Overall PROM Comments Assessed supine, ER/IR adducted   PROM Assessment Site Shoulder   Right/Left  Shoulder Left   Left Shoulder Flexion 175 Degrees  previous 95   Left Shoulder ABduction 176 Degrees  previous 110   Left Shoulder Internal Rotation 90 Degrees  previous 65   Left Shoulder External Rotation 90 Degrees  previous 90   Strength   Strength Assessment Site Shoulder   Right/Left Shoulder Left   Left Shoulder Flexion 4/5  previous 3/5   Left Shoulder ABduction 3+/5  previous 3/5   Left Shoulder Internal Rotation 4/5  previous 3+/5   Left Shoulder External Rotation 4/5  previous 3+/5                  OT Treatments/Exercises (OP) - 11/13/15 1528    Exercises   Exercises Shoulder;Neck   Neck Exercises: Stretches   Upper Trapezius Stretch 2 reps;10 seconds   Levator Stretch 2 reps;10 seconds   Lower Cervical/Upper Thoracic Stretch 2 reps;10 seconds   Shoulder Exercises: Supine   Protraction PROM;AROM;10 reps   Horizontal ABduction  PROM;AROM;10 reps   External Rotation PROM;AROM;10 reps   Internal Rotation PROM;AROM;10 reps   Flexion PROM;AROM;10 reps   ABduction PROM;AROM;10 reps   Manual Therapy   Manual Therapy Myofascial release;Manual Traction   Myofascial Release Myofascial release and manual stretching to left upper arm, trapezius, scapularis and bilateral cervical region to decrease fascial restrictions and increase joint mobility in a pain free zone.    Manual Traction Cervical manual traction completed this session.                  OT Short Term Goals - 11/13/15 1558    OT SHORT TERM GOAL #1   Title Pt will be educated on and independent in HEP.    Status On-going   OT SHORT TERM GOAL #2   Title Pt will return to prior level of functioning and independence in ADL and work tasks.    Status On-going   OT SHORT TERM GOAL #3   Title Pt will decrease pain to 2/10 or less during daily tasks.    Status On-going   OT SHORT TERM GOAL #4   Title Pt will decrease fascial restrictions from mod to min amounts or less.    Status On-going   OT SHORT TERM GOAL #5   Title Pt will increase AROM to [facility] to increase ability to reach into high cabinets.    Status Achieved   Additional Short Term Goals   Additional Short Term Goals Yes   OT SHORT TERM GOAL #6   Title Pt will increase strength to 4/5 to increase ability to lift lightweight objects.    Status On-going                  Plan - 11/13/15 1621    Clinical Impression Statement A: Mini reassessment completed this session, however pt has only completed 5 visits thus far since initial evaluation. Pt has met 1/5 STGs and is progressing towards remaining goals. Pt has improved P/ROM and A/ROM to WNL. Pt reports MD wants her to continue therapy for 4 additional weeks, and she is still looking for the new order he sent for her neck.  Added A/ROM in supine this session.    Plan P: Continue skilled OT services 2x/week for 4 additional weeks,  focusing on decreasing pain and fascial restrictions, and increasing strength in LUE to improve functional use during daily tasks. Next session: provide cervical stretching HEP. Add A/ROM in standing.  Problem List Patient Active Problem List   Diagnosis Date Noted  . Trichomoniasis 08/29/2014  . Status post total hysterectomy and bilateral salpingo-oophorectomy 07/13/2014  . S/P hysterectomy with oophorectomy 07/10/2014  . H N P-LUMBAR 02/24/2010  . BACK PAIN 02/24/2010  . HIGH BLOOD PRESSURE 02/20/2010    Guadelupe Sabin, OTR/L  [phone]  11/13/2015, 4:25 PM  [facility] [address] [place], [place], [address] Phone: [phone]   Fax:  [phone]  Name: [name] MRN: [id number] Date of Birth: 01-Aug-1961

## 2015-11-20 ENCOUNTER — Encounter (HOSPITAL_COMMUNITY): Payer: Self-pay

## 2015-11-20 ENCOUNTER — Ambulatory Visit (HOSPITAL_COMMUNITY): Payer: BLUE CROSS/BLUE SHIELD

## 2015-11-20 DIAGNOSIS — M25512 Pain in left shoulder: Secondary | ICD-10-CM

## 2015-11-20 DIAGNOSIS — M25612 Stiffness of left shoulder, not elsewhere classified: Secondary | ICD-10-CM

## 2015-11-20 DIAGNOSIS — M6281 Muscle weakness (generalized): Secondary | ICD-10-CM

## 2015-11-20 NOTE — Patient Instructions (Signed)
1) Shoulder Protraction    Begin with elbows by your side, slowly "punch" straight out in front of you keeping arms/elbows straight. Repeat _10__times, ____set/day.     2) Shoulder Flexion  Supine:     Standing:         Begin with arms at your side with thumbs pointed up, slowly raise both arms up and forward towards overhead. Repeat_10__times, ___set/day.               3) Horizontal abduction/adduction  Supine:   Standing:           Begin with arms straight out in front of you, bring out to the side in at "T" shape. Keep arms straight entire time. Repeat _10___times, ____sets/day.          4) Internal & External Rotation    *No band* -Stand with elbows at the side and elbows bent 90 degrees. Move your forearms away from your body, then bring back inward toward the body.  Repeat _10__times, ___sets/day    5) Shoulder Abduction  Supine:     Standing:       Lying on your back begin with your arms flat on the table next to your side. Slowly move your arms out to the side so that they go overhead, in a jumping jack or snow angel movement. Repeat _10__times, ___sets/day

## 2015-11-20 NOTE — Therapy (Signed)
[place], [place], [address] Phone: [phone]   Fax:  [phone]  Occupational Therapy Treatment  Patient Details  Name: [name] MRN: [id number] Date of Birth: Oct 30, 1961 Referring Provider: Drema Dallas  Encounter Date: 11/20/2015      OT End of Session - 11/20/15 1220    Visit Number 6   Number of Visits 13   Date for OT Re-Evaluation 12/15/15   Authorization Type BCBS   Authorization Time Period Visit limit 30   Authorization - Visit Number 5   Authorization - Number of Visits 30   OT Start Time 1020   OT Stop Time 1100   OT Time Calculation (min) 40 min   Activity Tolerance Patient tolerated treatment well   Behavior During Therapy [facility] for tasks assessed/performed      Past Medical History  Diagnosis Date  . Hypertension   . Obesity   . Sickle cell trait ([place])   . Asthma     pt says she hasn't had issues with asthma in over a year  . Sleep apnea     Stop Bang score of 4  . Kidney stone     Past Surgical History  Procedure Laterality Date  . Back surgery    . Wrist surgery    . Tubal ligation    . Colonoscopy N/A 07/21/2013    Procedure: COLONOSCOPY;  Surgeon: Danie Binder, MD;  [facility];  Service: Endoscopy;  Laterality: N/A;  10:15 AM  . Cyst excision Right     knee  . Abdominal hysterectomy N/A 07/10/2014    Procedure: HYSTERECTOMY ABDOMINAL;  Surgeon: Jonnie Kind, MD;  Location: [facility] ORS;  Service: Gynecology;  Laterality: N/A;  . Salpingoophorectomy Bilateral 07/10/2014    Procedure: SALPINGO OOPHORECTOMY;  Surgeon: Jonnie Kind, MD;  Location: [facility] ORS;  Service: Gynecology;  Laterality: Bilateral;    There were no vitals filed for this visit.  Visit Diagnosis:  Muscle weakness of left upper extremity  Shoulder stiffness, left  Pain in left shoulder      Subjective Assessment - 11/20/15 1042    Subjective  S: I'm going to call the doctor to have him  send you that order. My husband thinks we may have thrown it away.   Currently in Pain? Yes   Pain Score 3    Pain Location Shoulder   Pain Orientation Left   Pain Descriptors / Indicators Sore   Pain Type Acute pain            [facility] OT Assessment - 11/20/15 1043    Assessment   Diagnosis left shoulder stiffness   Precautions   Precautions None                  OT Treatments/Exercises (OP) - 11/20/15 1043    Exercises   Exercises Shoulder   Shoulder Exercises: Supine   Protraction PROM;5 reps;Strengthening;12 reps   Protraction Weight (lbs) 1   Horizontal ABduction PROM;5 reps;Strengthening;12 reps   Horizontal ABduction Weight (lbs) 1   External Rotation PROM;5 reps;Strengthening;12 reps   External Rotation Weight (lbs) 1   Internal Rotation PROM;5 reps;Strengthening;12 reps   Internal Rotation Weight (lbs) 1   Flexion PROM;5 reps;Strengthening;12 reps   Shoulder Flexion Weight (lbs) 1   ABduction PROM;5 reps;Strengthening;10 reps   Shoulder ABduction Weight (lbs) 1   Shoulder Exercises: Standing   Protraction Strengthening;10 reps   Protraction Weight (lbs) 1   Horizontal  ABduction Strengthening;10 reps   Horizontal ABduction Weight (lbs) 1   External Rotation Strengthening;10 reps   External Rotation Weight (lbs) 1   Internal Rotation Strengthening;10 reps   Internal Rotation Weight (lbs) 1   Flexion Strengthening;10 reps   Shoulder Flexion Weight (lbs) 1   ABduction Strengthening;10 reps   Shoulder ABduction Weight (lbs) 1   Manual Therapy   Manual Therapy Myofascial release   Manual therapy comments manual therapy was completed prior to exercises.   Myofascial Release Myofascial release and manual stretching to left upper arm, trapezius, scapularis and bilateral cervical region to decrease fascial restrictions and increase joint mobility in a pain free zone.                 OT Education - 11/20/15 1220    Education provided Yes    Education Details A/ROM exercises    Person(s) Educated Patient   Methods Explanation;Demonstration;Handout   Comprehension Verbalized understanding;Returned demonstration          OT Short Term Goals - 11/20/15 1224    OT SHORT TERM GOAL #1   Title Pt will be educated on and independent in [place].    Status On-going   OT SHORT TERM GOAL #2   Title Pt will return to prior level of functioning and independence in ADL and work tasks.    Status On-going   OT SHORT TERM GOAL #3   Title Pt will decrease pain to 2/10 or less during daily tasks.    Status On-going   OT SHORT TERM GOAL #4   Title Pt will decrease fascial restrictions from mod to min amounts or less.    Status On-going   OT SHORT TERM GOAL #5   Title Pt will increase AROM to [facility] to increase ability to reach into high cabinets.    OT SHORT TERM GOAL #6   Title Pt will increase strength to 4/5 to increase ability to lift lightweight objects.    Status On-going                  Plan - 11/20/15 1220    Clinical Impression Statement A: Did not provide cervical stretches to HEP due to time constraint and patient did not have order from MD to add neck to therapy. Added 1# to supine and standing exercises. Patient needed VC for form and technique.    Plan P: Add X to V arms and scapular theraband exercises.        Problem List Patient Active Problem List   Diagnosis Date Noted  . Trichomoniasis 08/29/2014  . Status post total hysterectomy and bilateral salpingo-oophorectomy 07/13/2014  . S/P hysterectomy with oophorectomy 07/10/2014  . H N P-LUMBAR 02/24/2010  . BACK PAIN 02/24/2010  . HIGH BLOOD PRESSURE 02/20/2010    Ailene Ravel, OTR/L,CBIS  [phone]  11/20/2015, 12:24 PM  [facility] [address] [place], [place], [address] Phone: [phone]   Fax:  [phone]  Name: Zayliana Parady MRN: [id number] Date of Birth: 1961/11/26

## 2015-11-28 ENCOUNTER — Encounter (HOSPITAL_COMMUNITY): Payer: Self-pay | Admitting: Occupational Therapy

## 2015-11-28 ENCOUNTER — Ambulatory Visit (HOSPITAL_COMMUNITY): Payer: BLUE CROSS/BLUE SHIELD | Attending: Sports Medicine | Admitting: Occupational Therapy

## 2015-11-28 DIAGNOSIS — M25512 Pain in left shoulder: Secondary | ICD-10-CM | POA: Insufficient documentation

## 2015-11-28 DIAGNOSIS — M25612 Stiffness of left shoulder, not elsewhere classified: Secondary | ICD-10-CM | POA: Diagnosis present

## 2015-11-28 DIAGNOSIS — M6281 Muscle weakness (generalized): Secondary | ICD-10-CM | POA: Insufficient documentation

## 2015-11-28 NOTE — Therapy (Signed)
[place], [place], [address] Phone: [phone]   Fax:  [phone]  Occupational Therapy Treatment  Patient Details  Name: [name] MRN: [id number] Date of Birth: 15-Aug-1961 Referring Provider: Drema Dallas  Encounter Date: 11/28/2015      OT End of Session - 11/28/15 1610    Visit Number 7   Number of Visits 13   Date for OT Re-Evaluation 12/15/15   Authorization Type BCBS   Authorization Time Period Visit limit 30   Authorization - Visit Number 6   Authorization - Number of Visits 30   OT Start Time 1520   OT Stop Time 1600   OT Time Calculation (min) 40 min   Activity Tolerance Patient tolerated treatment well   Behavior During Therapy [facility] for tasks assessed/performed      Past Medical History  Diagnosis Date  . Hypertension   . Obesity   . Sickle cell trait ([place])   . Asthma     pt says she hasn't had issues with asthma in over a year  . Sleep apnea     Stop Bang score of 4  . Kidney stone     Past Surgical History  Procedure Laterality Date  . Back surgery    . Wrist surgery    . Tubal ligation    . Colonoscopy N/A 07/21/2013    Procedure: COLONOSCOPY;  Surgeon: Danie Binder, MD;  [facility];  Service: Endoscopy;  Laterality: N/A;  10:15 AM  . Cyst excision Right     knee  . Abdominal hysterectomy N/A 07/10/2014    Procedure: HYSTERECTOMY ABDOMINAL;  Surgeon: Jonnie Kind, MD;  Location: [facility] ORS;  Service: Gynecology;  Laterality: N/A;  . Salpingoophorectomy Bilateral 07/10/2014    Procedure: SALPINGO OOPHORECTOMY;  Surgeon: Jonnie Kind, MD;  Location: [facility] ORS;  Service: Gynecology;  Laterality: Bilateral;    There were no vitals filed for this visit.  Visit Diagnosis:  Muscle weakness of left upper extremity  Shoulder stiffness, left  Pain in left shoulder      Subjective Assessment - 11/28/15 1522    Subjective  S: My shoulder is feeling pretty good, it's  my neck that is bothering me.    Currently in Pain? Yes   Pain Score 2    Pain Location Shoulder   Pain Orientation Left   Pain Descriptors / Indicators Sore            [facility] OT Assessment - 11/28/15 1609    Assessment   Diagnosis left shoulder stiffness   Precautions   Precautions None                  OT Treatments/Exercises (OP) - 11/28/15 1523    Exercises   Exercises Shoulder   Shoulder Exercises: Supine   Protraction PROM;5 reps;Strengthening;12 reps   Protraction Weight (lbs) 1   Horizontal ABduction PROM;5 reps;Strengthening;12 reps   Horizontal ABduction Weight (lbs) 1   External Rotation PROM;5 reps;Strengthening;12 reps   External Rotation Weight (lbs) 1   Internal Rotation PROM;5 reps;Strengthening;12 reps   Internal Rotation Weight (lbs) 1   Flexion PROM;5 reps;Strengthening;12 reps   Shoulder Flexion Weight (lbs) 1   ABduction PROM;5 reps;Strengthening;10 reps   Shoulder ABduction Weight (lbs) 1   Shoulder Exercises: Standing   Protraction Strengthening;10 reps   Protraction Weight (lbs) 1   Horizontal ABduction Strengthening;10 reps   Horizontal ABduction Weight (lbs) 1   External Rotation  Strengthening;10 reps   External Rotation Weight (lbs) 1   Internal Rotation Strengthening;10 reps   Internal Rotation Weight (lbs) 1   Flexion Strengthening;10 reps   Shoulder Flexion Weight (lbs) 1   ABduction Strengthening;10 reps   Shoulder ABduction Weight (lbs) 1   Extension Theraband;10 reps   Theraband Level (Shoulder Extension) Level 2 (Red)   Row Theraband;10 reps   Theraband Level (Shoulder Row) Level 2 (Red)   Retraction Theraband;10 reps   Theraband Level (Shoulder Retraction) Level 2 (Red)   Shoulder Exercises: ROM/Strengthening   X to V Arms 10X with 1# weight   Proximal Shoulder Strengthening, Supine 12X each 1# weight   Manual Therapy   Manual Therapy Myofascial release   Manual therapy comments manual therapy was completed prior  to exercises.   Myofascial Release Myofascial release and manual stretching to left upper arm, trapezius, scapularis and bilateral cervical region to decrease fascial restrictions and increase joint mobility in a pain free zone.                 OT Education - 11/28/15 1610    Education provided Yes   Education Details educated on adding weights to A/ROM exercises   Person(s) Educated Patient   Methods Explanation;Demonstration;Handout   Comprehension Verbalized understanding;Returned demonstration          OT Short Term Goals - 11/20/15 1224    OT SHORT TERM GOAL #1   Title Pt will be educated on and independent in [place].    Status On-going   OT SHORT TERM GOAL #2   Title Pt will return to prior level of functioning and independence in ADL and work tasks.    Status On-going   OT SHORT TERM GOAL #3   Title Pt will decrease pain to 2/10 or less during daily tasks.    Status On-going   OT SHORT TERM GOAL #4   Title Pt will decrease fascial restrictions from mod to min amounts or less.    Status On-going   OT SHORT TERM GOAL #5   Title Pt will increase AROM to [facility] to increase ability to reach into high cabinets.    OT SHORT TERM GOAL #6   Title Pt will increase strength to 4/5 to increase ability to lift lightweight objects.    Status On-going                  Plan - 11/28/15 1611    Clinical Impression Statement A: Pt brought order for cervical pain, left in car, planed to give to front office staff at end of session; OT reminded pt at end of session. Added x to v arms, scapular theraband, and proximal shoulder strengthening in supine for greater scapular stability. Pt required verbal cuing for form during scapular theraband exercises.    Plan P: Add proximal shoulder strengthening in standing, add cervical stretches-verify order. Add scapular theraband HEP.         Problem List Patient Active Problem List   Diagnosis Date Noted  . Trichomoniasis  08/29/2014  . Status post total hysterectomy and bilateral salpingo-oophorectomy 07/13/2014  . S/P hysterectomy with oophorectomy 07/10/2014  . H N P-LUMBAR 02/24/2010  . BACK PAIN 02/24/2010  . HIGH BLOOD PRESSURE 02/20/2010    Guadelupe Sabin, OTR/L  [phone]  11/28/2015, 4:14 PM  [facility] [address] [place], [place], [address] Phone: [phone]   Fax:  [phone]  Name: Shontia Pies MRN: [id number] Date of Birth:  12/19/1961   

## 2015-11-28 NOTE — Patient Instructions (Signed)
1) Shoulder Protraction    Begin with elbows by your side, slowly "punch" straight out in front of you keeping arms/elbows straight. Repeat _10-15__times, __2__set/day.     2) Shoulder Flexion  Supine:     Standing:         Begin with arms at your side with thumbs pointed up, slowly raise both arms up and forward towards overhead. Repeat_10-15__times, _2__set/day.               3) Horizontal abduction/adduction  Supine:   Standing:           Begin with arms straight out in front of you, bring out to the side in at "T" shape. Keep arms straight entire time. Repeat __10-15__times, __2__sets/day.                 4) Internal & External Rotation    *No band* -Stand with elbows at the side and elbows bent 90 degrees. Move your forearms away from your body, then bring back inward toward the body.  Repeat __10_times, _2__sets/day    5) Shoulder Abduction  Supine:     Standing:       Lying on your back begin with your arms flat on the table next to your side. Slowly move your arms out to the side so that they go overhead, in a jumping jack or snow angel movement. Repeat _10-15__times, _2__sets/day

## 2015-12-04 ENCOUNTER — Ambulatory Visit (HOSPITAL_COMMUNITY): Payer: BLUE CROSS/BLUE SHIELD | Admitting: Occupational Therapy

## 2015-12-06 ENCOUNTER — Encounter (HOSPITAL_COMMUNITY): Payer: BLUE CROSS/BLUE SHIELD

## 2015-12-09 ENCOUNTER — Telehealth (HOSPITAL_COMMUNITY): Payer: Self-pay

## 2015-12-09 ENCOUNTER — Ambulatory Visit (HOSPITAL_COMMUNITY): Payer: BLUE CROSS/BLUE SHIELD

## 2015-12-09 NOTE — Telephone Encounter (Signed)
DATE: 12/09/15  Called and left message regarding missed appointment on 12/09/15. Marland Kitchen Reminded patient of next appointment and told her to call us if she is unable to make it.   Ailene Ravel, OTR/L,CBIS  [phone]

## 2015-12-11 ENCOUNTER — Ambulatory Visit (HOSPITAL_COMMUNITY): Payer: BLUE CROSS/BLUE SHIELD | Admitting: Occupational Therapy

## 2015-12-16 ENCOUNTER — Ambulatory Visit (HOSPITAL_COMMUNITY): Payer: BLUE CROSS/BLUE SHIELD | Admitting: Specialist

## 2015-12-17 ENCOUNTER — Encounter (HOSPITAL_COMMUNITY): Payer: BLUE CROSS/BLUE SHIELD | Admitting: Specialist

## 2015-12-18 ENCOUNTER — Encounter (HOSPITAL_COMMUNITY): Payer: BLUE CROSS/BLUE SHIELD | Admitting: Occupational Therapy

## 2015-12-19 ENCOUNTER — Ambulatory Visit (HOSPITAL_COMMUNITY): Payer: BLUE CROSS/BLUE SHIELD

## 2015-12-25 ENCOUNTER — Ambulatory Visit (HOSPITAL_COMMUNITY): Payer: BLUE CROSS/BLUE SHIELD

## 2015-12-27 ENCOUNTER — Encounter (HOSPITAL_COMMUNITY): Payer: BLUE CROSS/BLUE SHIELD | Admitting: Occupational Therapy

## 2016-01-06 ENCOUNTER — Encounter (HOSPITAL_COMMUNITY): Payer: Self-pay

## 2016-01-06 NOTE — Therapy (Signed)
[place], [place], [address] Phone: [phone]   Fax:  [phone]  Patient Details  Name: [name] MRN: [id number] Date of Birth: 06-20-1961 Referring Provider:  No ref. provider found  Encounter Date: 01/06/2016 OCCUPATIONAL THERAPY DISCHARGE SUMMARY  Visits from Start of Care: 7  Current functional level related to goals / functional outcomes: OT SHORT TERM GOAL #1    Title Pt will be educated on and independent in [place].    Status On-going   OT SHORT TERM GOAL #2   Title Pt will return to prior level of functioning and independence in ADL and work tasks.    Status On-going   OT SHORT TERM GOAL #3   Title Pt will decrease pain to 2/10 or less during daily tasks.    Status On-going   OT SHORT TERM GOAL #4   Title Pt will decrease fascial restrictions from mod to min amounts or less.    Status On-going   OT SHORT TERM GOAL #5   Title Pt will increase AROM to [facility] to increase ability to reach into high cabinets.    OT SHORT TERM GOAL #6   Title Pt will increase strength to 4/5 to increase ability to lift lightweight objects.    Status On-going            Remaining deficits: Patient did not return after therapy visit on 11/28/15 and will be discharged. All goals were not met and all deficits remain.     Plan:                                                    Patient goals were not met. Patient is being discharged due to not returning since the last visit.  ?????       Ailene Ravel, OTR/L,CBIS  [phone]  01/06/2016, 10:36 AM  [facility] [address] [place], [place], [address] Phone: [phone]   Fax:  [phone]

## 2016-03-12 ENCOUNTER — Emergency Department (HOSPITAL_COMMUNITY): Payer: BLUE CROSS/BLUE SHIELD

## 2016-03-12 ENCOUNTER — Emergency Department (HOSPITAL_COMMUNITY)
Admission: EM | Admit: 2016-03-12 | Discharge: 2016-03-12 | Disposition: A | Discharge status: Home / Self Care | Payer: BLUE CROSS/BLUE SHIELD | Attending: Emergency Medicine | Admitting: Emergency Medicine

## 2016-03-12 ENCOUNTER — Encounter (HOSPITAL_COMMUNITY): Payer: Self-pay | Admitting: Emergency Medicine

## 2016-03-12 DIAGNOSIS — Z79899 Other long term (current) drug therapy: Secondary | ICD-10-CM | POA: Insufficient documentation

## 2016-03-12 DIAGNOSIS — Z7982 Long term (current) use of aspirin: Secondary | ICD-10-CM | POA: Insufficient documentation

## 2016-03-12 DIAGNOSIS — Y939 Activity, unspecified: Secondary | ICD-10-CM | POA: Insufficient documentation

## 2016-03-12 DIAGNOSIS — E669 Obesity, unspecified: Secondary | ICD-10-CM | POA: Insufficient documentation

## 2016-03-12 DIAGNOSIS — S99911A Unspecified injury of right ankle, initial encounter: Secondary | ICD-10-CM | POA: Diagnosis present

## 2016-03-12 DIAGNOSIS — S96911A Strain of unspecified muscle and tendon at ankle and foot level, right foot, initial encounter: Secondary | ICD-10-CM | POA: Diagnosis not present

## 2016-03-12 DIAGNOSIS — Y929 Unspecified place or not applicable: Secondary | ICD-10-CM | POA: Diagnosis not present

## 2016-03-12 DIAGNOSIS — J45909 Unspecified asthma, uncomplicated: Secondary | ICD-10-CM | POA: Insufficient documentation

## 2016-03-12 DIAGNOSIS — I10 Essential (primary) hypertension: Secondary | ICD-10-CM | POA: Insufficient documentation

## 2016-03-12 DIAGNOSIS — X58XXXA Exposure to other specified factors, initial encounter: Secondary | ICD-10-CM | POA: Insufficient documentation

## 2016-03-12 DIAGNOSIS — Y999 Unspecified external cause status: Secondary | ICD-10-CM | POA: Diagnosis not present

## 2016-03-12 MED ORDER — HYDROCODONE-ACETAMINOPHEN 5-325 MG PO TABS: 1.0000 | ORAL_TABLET | ORAL | Status: DC | PRN

## 2016-03-12 MED ORDER — IBUPROFEN 600 MG PO TABS: 600.0000 mg | ORAL_TABLET | Freq: Four times a day (QID) | ORAL | Status: DC | PRN

## 2016-03-12 NOTE — Discharge Instructions (Signed)
Ankle Sprain  An ankle sprain is an injury to the strong, fibrous tissues (ligaments) that hold the bones of your ankle joint together.   CAUSES  An ankle sprain is usually caused by a fall or by twisting your ankle. Ankle sprains most commonly occur when you step on the outer edge of your foot, and your ankle turns inward. People who participate in sports are more prone to these types of injuries.   SYMPTOMS    Pain in your ankle. The pain may be present at rest or only when you are trying to stand or walk.   Swelling.   Bruising. Bruising may develop immediately or within 1 to 2 days after your injury.   Difficulty standing or walking, particularly when turning corners or changing directions.  DIAGNOSIS   Your caregiver will ask you details about your injury and perform a physical exam of your ankle to determine if you have an ankle sprain. During the physical exam, your caregiver will press on and apply pressure to specific areas of your foot and ankle. Your caregiver will try to move your ankle in certain ways. An X-ray exam may be done to be sure a bone was not broken or a ligament did not separate from one of the bones in your ankle (avulsion fracture).   TREATMENT   Certain types of braces can help stabilize your ankle. Your caregiver can make a recommendation for this. Your caregiver may recommend the use of medicine for pain. If your sprain is severe, your caregiver may refer you to a surgeon who helps to restore function to parts of your skeletal system (orthopedist) or a physical therapist.  HOME CARE INSTRUCTIONS    Apply ice to your injury for 1-2 days or as directed by your caregiver. Applying ice helps to reduce inflammation and pain.    Put ice in a plastic bag.    Place a towel between your skin and the bag.    Leave the ice on for 15-20 minutes at a time, every 2 hours while you are awake.   Only take over-the-counter or prescription medicines for pain, discomfort, or fever as directed by  your caregiver.   Elevate your injured ankle above the level of your heart as much as possible for 2-3 days.   If your caregiver recommends crutches, use them as instructed. Gradually put weight on the affected ankle. Continue to use crutches or a cane until you can walk without feeling pain in your ankle.   If you have a plaster splint, wear the splint as directed by your caregiver. Do not rest it on anything harder than a pillow for the first 24 hours. Do not put weight on it. Do not get it wet. You may take it off to take a shower or bath.   You may have been given an elastic bandage to wear around your ankle to provide support. If the elastic bandage is too tight (you have numbness or tingling in your foot or your foot becomes cold and blue), adjust the bandage to make it comfortable.   If you have an air splint, you may blow more air into it or let air out to make it more comfortable. You may take your splint off at night and before taking a shower or bath. Wiggle your toes in the splint several times per day to decrease swelling.  SEEK MEDICAL CARE IF:    You have rapidly increasing bruising or swelling.   Your toes feel   extremely cold or you lose feeling in your foot.   Your pain is not relieved with medicine.  SEEK IMMEDIATE MEDICAL CARE IF:   Your toes are numb or blue.   You have severe pain that is increasing.  MAKE SURE YOU:    Understand these instructions.   Will watch your condition.   Will get help right away if you are not doing well or get worse.     This information is not intended to replace advice given to you by your health care provider. Make sure you discuss any questions you have with your health care provider.     Document Released: 12/14/2005 Document Revised: 01/04/2015 Document Reviewed: 12/26/2011  Elsevier Interactive Patient Education 2016 [employer]

## 2016-03-12 NOTE — ED Notes (Signed)
Pt states she has been having right foot and ankle pain for over a week.  Denies injury.

## 2016-03-13 NOTE — ED Provider Notes (Signed)
CSN: [id number]     Arrival date & time 03/12/16  1750 History   First MD Initiated Contact with Patient 03/12/16 1809     Chief Complaint  Patient presents with  . Ankle Pain     (Consider location/radiation/quality/duration/timing/severity/associated sxs/prior Treatment) The history is provided by the patient.   [name] is a 55 y.o. female with medical history outlined below, most significant for mobid obesity, presenting with right foot and ankle pain for the past week.  She denies injury, stating the pain came on gradually. 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 Pain is ok at rest, worsened with weight bearing and with flex/ext of the ankle. The pain is localized to the mid dorsal foot and radiates into the anterior ankle.  She has tried rest, tylenol and ben gay without relief.     Past Medical History  Diagnosis Date  . Hypertension   . Obesity   . Sickle cell trait ([place])   . Asthma     pt says she hasn't had issues with asthma in over a year  . Sleep apnea     Stop Bang score of 4  . Kidney stone    Past Surgical History  Procedure Laterality Date  . Back surgery    . Wrist surgery    . Tubal ligation    . Colonoscopy N/A 07/21/2013    Procedure: COLONOSCOPY;  Surgeon: Danie Binder, MD;  [facility]: [facility];  Service: Endoscopy;  Laterality: N/A;  10:15 AM  . Cyst excision Right     knee  . Abdominal hysterectomy N/A 07/10/2014    Procedure: HYSTERECTOMY ABDOMINAL;  Surgeon: Jonnie Kind, MD;  Location: [facility];  Service: Gynecology;  Laterality: N/A;  . Salpingoophorectomy Bilateral 07/10/2014    Procedure: SALPINGO OOPHORECTOMY;  Surgeon: Jonnie Kind, MD;  Location: [facility];  Service: Gynecology;  Laterality: Bilateral;   Family History  Problem Relation Age of Onset  . Hypertension Mother   . Sickle cell trait Mother   . Heart attack  Father   . Sickle cell anemia Sister   . Sickle cell trait Sister    Social History  Substance Use Topics  . Smoking status: Never Smoker   . Smokeless tobacco: Never Used  . Alcohol Use: No   OB History    No data available     Review of Systems  Musculoskeletal: Positive for arthralgias. Negative for joint swelling.  Skin: Negative for wound.  Neurological: Negative for weakness and numbness.      Allergies  Oxycodone  Home Medications   Prior to Admission medications   Medication Sig Start Date End Date Taking? Authorizing Provider  aspirin 325 MG tablet Take 325 mg by mouth daily.   Yes Historical Provider, MD  cholecalciferol (VITAMIN D) 1000 UNITS tablet Take 1,000 Units by mouth daily.     Yes Historical Provider, MD  fish oil-omega-3 fatty acids 1000 MG capsule Take 1 g by mouth 2 (two) times daily.   Yes Historical Provider, MD  lisinopril-hydrochlorothiazide (PRINZIDE,ZESTORETIC) 20-12.5 MG per tablet Take 1 tablet by mouth daily.     Yes Historical Provider, MD  vitamin C (ASCORBIC ACID) 500 MG tablet Take 500 mg by mouth daily.     Yes Historical Provider, MD  acetaminophen-codeine (TYLENOL #3) 300-30 MG per tablet Take 1-2 tablets by mouth every 4 (four)  hours as needed for moderate pain. Patient not taking: Reported on 03/12/2016 07/13/14   Jonnie Kind, MD  diphenhydramine-acetaminophen (TYLENOL PM) 25-500 MG TABS Take 1 tablet by mouth at bedtime as needed (Pain, Sleep).    Historical Provider, MD  furosemide (LASIX) 20 MG tablet Take 1 tablet (20 mg total) by mouth daily. Patient not taking: Reported on 03/12/2016 07/13/14   Jonnie Kind, MD  HYDROcodone-acetaminophen (NORCO/VICODIN) 5-325 MG tablet Take 1 tablet by mouth every 4 (four) hours as needed. 03/12/16   Evalee Jefferson, PA-C  ibuprofen (ADVIL,MOTRIN) 600 MG tablet Take 1 tablet (600 mg total) by mouth every 6 (six) hours as needed. 03/12/16   Evalee Jefferson, PA-C  metroNIDAZOLE (FLAGYL) 500 MG tablet Take 1  tablet (500 mg total) by mouth 2 (two) times daily. Patient not taking: Reported on 03/12/2016 08/06/14   Jonnie Kind, MD   BP 124/81 mmHg  Pulse 73  Temp(Src) 98.5 F (36.9 C) (Oral)  Resp 18  Ht 5\' 4"  (1.626 m)  Wt 145.151 kg  BMI 54.90 kg/m2  SpO2 100%  LMP 06/19/2014 Physical Exam  Constitutional: She appears well-developed and well-nourished.  HENT:  Head: Normocephalic.  Cardiovascular: Normal rate and intact distal pulses.  Exam reveals no decreased pulses.   Pulses:      Dorsalis pedis pulses are 2+ on the right side, and 2+ on the left side.       Posterior tibial pulses are 2+ on the right side, and 2+ on the left side.  Musculoskeletal: She exhibits tenderness. She exhibits no edema.       Right ankle: She exhibits decreased range of motion. She exhibits no swelling, no ecchymosis, no deformity and normal pulse. No head of 5th metatarsal and no proximal fibula tenderness found. Achilles tendon normal.       Right foot: There is bony tenderness. There is normal capillary refill and no deformity.       Feet:  Neurological: She is alert. No sensory deficit.  Skin: Skin is warm, dry and intact.  Nursing note and vitals reviewed.   ED Course  Procedures (including critical care time) Labs Review Labs Reviewed - No data to display  Imaging Review Dg Ankle Complete Right  03/12/2016  CLINICAL DATA:  Pain without trauma EXAM: RIGHT ANKLE - COMPLETE 3+ VIEW COMPARISON:  None. FINDINGS: No fracture or dislocation. Plantar spur. Degenerative changes identified. IMPRESSION: No acute abnormality Electronically Signed   By: Dorise Bullion III M.D   On: 03/12/2016 18:25   Dg Foot Complete Right  03/12/2016  CLINICAL DATA:  Hervey Ard constant pain in the medial aspect of right foot and right ankle, increased with weight-bearing for 2 days. EXAM: RIGHT FOOT COMPLETE - 3+ VIEW COMPARISON:  None. FINDINGS: There is no evidence of fracture or dislocation. There is no evidence of a  significant arthropathy or other focal bone abnormality. Mild degenerative spurring noted within the midfoot and along the undersurface of the posterior calcaneus. Soft tissues are unremarkable. IMPRESSION: No acute findings.  Mild spurring of the midfoot and calcaneus. Electronically Signed   By: Franki Cabot M.D.   On: 03/12/2016 18:25   I have personally reviewed and evaluated these images and lab results as part of my medical decision-making.   EKG Interpretation None      MDM   Final diagnoses:  Right foot strain, initial encounter    Imaging reviewed, discussed arthritis changes.  No stress fracture on plain films. She was placed  in an aso for support and compression. Advised continued elevation, rest, prescribed ibuprofen and hydrocodone prn. Referral to ortho for prn f/u if sx persist or worsen. Suspect strain from overuse and obesity.  The patient appears reasonably screened and/or stabilized for discharge and I doubt any other medical condition or other [facility] requiring further screening, evaluation, or treatment in the ED at this time prior to discharge.     Evalee Jefferson, PA-C 03/13/16 [place], MD 03/13/16 1911

## 2016-11-01 ENCOUNTER — Emergency Department (HOSPITAL_COMMUNITY): Payer: BLUE CROSS/BLUE SHIELD

## 2016-11-01 ENCOUNTER — Emergency Department (HOSPITAL_COMMUNITY)
Admission: EM | Admit: 2016-11-01 | Discharge: 2016-11-01 | Disposition: A | Discharge status: Home / Self Care | Payer: BLUE CROSS/BLUE SHIELD | Attending: Emergency Medicine | Admitting: Emergency Medicine

## 2016-11-01 ENCOUNTER — Encounter (HOSPITAL_COMMUNITY): Payer: Self-pay | Admitting: Emergency Medicine

## 2016-11-01 DIAGNOSIS — M722 Plantar fascial fibromatosis: Secondary | ICD-10-CM | POA: Diagnosis not present

## 2016-11-01 DIAGNOSIS — Z79899 Other long term (current) drug therapy: Secondary | ICD-10-CM | POA: Diagnosis not present

## 2016-11-01 DIAGNOSIS — M79672 Pain in left foot: Secondary | ICD-10-CM | POA: Diagnosis present

## 2016-11-01 DIAGNOSIS — I10 Essential (primary) hypertension: Secondary | ICD-10-CM | POA: Insufficient documentation

## 2016-11-01 DIAGNOSIS — Z7982 Long term (current) use of aspirin: Secondary | ICD-10-CM | POA: Insufficient documentation

## 2016-11-01 NOTE — ED Triage Notes (Signed)
Pt reports L foot pain that started on Thursday. Pt states she had problems with the same foot last year. Pt states the foot is swollen and tender to the touch.

## 2016-11-01 NOTE — Discharge Instructions (Signed)
See your Physicain for recheck in 3-4 days 

## 2016-11-01 NOTE — ED Notes (Signed)
Patient transported to X-ray 

## 2016-11-01 NOTE — ED Provider Notes (Signed)
[facility] DEPT Provider Note   CSN: [id number] Arrival date & time: 11/01/16  1114     History   Chief Complaint Chief Complaint  Patient presents with  . Foot Pain    HPI [name] is a 55 y.o. female.  The history is provided by the patient. No language interpreter was used.  Foot Pain  This is a new problem. Episode onset: 4 days. The problem occurs constantly. The problem has been gradually worsening. Nothing aggravates the symptoms. Nothing relieves the symptoms. She has tried nothing for the symptoms. The treatment provided no relief.  Pt complains of pain in her left foot.  Pt reports pain with standing and walking  Past Medical History:  Diagnosis Date  . Hypertension   . Obesity   . Sickle cell trait ([place])   . Sleep apnea    Stop Bang score of 4    Patient Active Problem List   Diagnosis Date Noted  . Trichomoniasis 08/29/2014  . Status post total hysterectomy and bilateral salpingo-oophorectomy 07/13/2014  . S/P hysterectomy with oophorectomy 07/10/2014  . H N P-LUMBAR 02/24/2010  . BACK PAIN 02/24/2010  . HIGH BLOOD PRESSURE 02/20/2010    Past Surgical History:  Procedure Laterality Date  . ABDOMINAL HYSTERECTOMY N/A 07/10/2014   Procedure: HYSTERECTOMY ABDOMINAL;  Surgeon: Jonnie Kind, MD;  Location: [facility];  Service: Gynecology;  Laterality: N/A;  . BACK SURGERY    . COLONOSCOPY N/A 07/21/2013   Procedure: COLONOSCOPY;  Surgeon: Danie Binder, MD;  Location: [facility];  Service: Endoscopy;  Laterality: N/A;  10:15 AM  . CYST EXCISION Right    knee  . SALPINGOOPHORECTOMY Bilateral 07/10/2014   Procedure: SALPINGO OOPHORECTOMY;  Surgeon: Jonnie Kind, MD;  Location: [facility];  Service: Gynecology;  Laterality: Bilateral;  . TUBAL LIGATION    . WRIST SURGERY      OB History    Gravida Para Term Preterm AB Living   3 2 2   1      SAB TAB Ectopic Multiple Live Births       1           Home Medications    Prior to Admission  medications   Medication Sig Start Date End Date Taking? Authorizing Provider  aspirin 325 MG tablet Take 325 mg by mouth daily.   Yes Historical Provider, MD  cholecalciferol (VITAMIN D) 1000 UNITS tablet Take 1,000 Units by mouth daily.     Yes Historical Provider, MD  fish oil-omega-3 fatty acids 1000 MG capsule Take 1 g by mouth 2 (two) times daily.   Yes Historical Provider, MD  lisinopril-hydrochlorothiazide (PRINZIDE,ZESTORETIC) 20-12.5 MG per tablet Take 1 tablet by mouth daily.     Yes Historical Provider, MD  diphenhydramine-acetaminophen (TYLENOL PM) 25-500 MG TABS Take 1 tablet by mouth at bedtime as needed (Pain, Sleep).    Historical Provider, MD    Family History Family History  Problem Relation Age of Onset  . Hypertension Mother   . Sickle cell trait Mother   . Heart attack Father   . Sickle cell anemia Sister   . Sickle cell trait Sister     Social History Social History  Substance Use Topics  . Smoking status: Never Smoker  . Smokeless tobacco: Never Used  . Alcohol use No     Allergies   Oxycodone   Review of Systems Review of Systems  All other systems reviewed and are negative.    Physical Exam Updated  Vital Signs BP 145/97 (BP Location: Left Arm)   Pulse 66   Temp 98.1 F (36.7 C) (Oral)   Resp 18   Ht 5\' 4"  (1.626 m)   Wt 136.1 kg   LMP 05/26/2014   SpO2 100%   BMI 51.49 kg/m   Physical Exam  Constitutional: She is oriented to person, place, and time. She appears well-developed and well-nourished.  HENT:  Head: Normocephalic.  Eyes: EOM are normal.  Neck: Normal range of motion.  Pulmonary/Chest: Effort normal.  Abdominal: She exhibits no distension.  Musculoskeletal: Normal range of motion.  Neurological: She is alert and oriented to person, place, and time.  Skin: Skin is warm.  Tender calcaneus,  Tender mid foot,  Tender ankle,  Pain with movement,  Slight swelling  Psychiatric: She has a normal mood and affect.  Nursing note  and vitals reviewed.    ED Treatments / Results  Labs (all labs ordered are listed, but only abnormal results are displayed) Labs Reviewed - No data to display  EKG  EKG Interpretation None       Radiology Dg Foot Complete Left  Result Date: 11/01/2016 CLINICAL DATA:  LEFT foot pain and swelling since Thursday, no injury EXAM: LEFT FOOT - COMPLETE 3+ VIEW COMPARISON:  None FINDINGS: Osseous demineralization. Dorsal spur formation at the Lisfranc joints on lateral view Joint spaces otherwise preserved. No acute fracture, dislocation, or bone destruction. Bulky plantar calcaneal spur. 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 Bulky plantar calcaneal spur. No acute osseous abnormalities. Electronically Signed   By: Lavonia Dana M.D.   On: 11/01/2016 11:49    Procedures Procedures (including critical care time)  Medications Ordered in ED Medications - No data to display   Initial Impression / Assessment and Plan / ED Course  I have reviewed the triage vital signs and the nursing notes.  Pertinent labs & imaging results that were available during my care of the patient were reviewed by me and considered in my medical decision making (see chart for details).  Clinical Course     I suspect plantar fascitis.   Pt advised to wear post op shoe,  Continue ibuprofen.  See primary Md for recheck in 1 week   Final Clinical Impressions(s) / ED Diagnoses   Final diagnoses:  Plantar fasciitis of left foot    New Prescriptions New Prescriptions   No medications on file  An After Visit Summary was printed and given to the patient.    Hollace Kinnier Bridger, PA-C 11/01/16 1241    Julianne Rice, MD 11/02/16 [phone]

## 2016-12-11 ENCOUNTER — Other Ambulatory Visit (HOSPITAL_COMMUNITY): Payer: Self-pay | Admitting: Internal Medicine

## 2016-12-11 DIAGNOSIS — Z1231 Encounter for screening mammogram for malignant neoplasm of breast: Secondary | ICD-10-CM

## 2017-01-04 ENCOUNTER — Ambulatory Visit (HOSPITAL_COMMUNITY): Payer: BLUE CROSS/BLUE SHIELD

## 2017-01-11 ENCOUNTER — Ambulatory Visit (HOSPITAL_COMMUNITY): Payer: BLUE CROSS/BLUE SHIELD

## 2017-01-18 ENCOUNTER — Ambulatory Visit (HOSPITAL_COMMUNITY)
Admission: RE | Admit: 2017-01-18 | Discharge: 2017-01-18 | Disposition: A | Discharge status: Home / Self Care | Payer: BLUE CROSS/BLUE SHIELD | Source: Ambulatory Visit | Attending: Internal Medicine | Admitting: Internal Medicine

## 2017-01-18 DIAGNOSIS — Z1231 Encounter for screening mammogram for malignant neoplasm of breast: Secondary | ICD-10-CM | POA: Diagnosis present

## 2017-10-17 ENCOUNTER — Emergency Department (HOSPITAL_COMMUNITY)
Admission: EM | Admit: 2017-10-17 | Discharge: 2017-10-17 | Disposition: A | Discharge status: Home / Self Care | Payer: BLUE CROSS/BLUE SHIELD | Attending: Emergency Medicine | Admitting: Emergency Medicine

## 2017-10-17 ENCOUNTER — Encounter (HOSPITAL_COMMUNITY): Payer: Self-pay | Admitting: Emergency Medicine

## 2017-10-17 DIAGNOSIS — I10 Essential (primary) hypertension: Secondary | ICD-10-CM | POA: Insufficient documentation

## 2017-10-17 DIAGNOSIS — M549 Dorsalgia, unspecified: Secondary | ICD-10-CM | POA: Insufficient documentation

## 2017-10-17 DIAGNOSIS — Z79899 Other long term (current) drug therapy: Secondary | ICD-10-CM | POA: Insufficient documentation

## 2017-10-17 DIAGNOSIS — M5432 Sciatica, left side: Secondary | ICD-10-CM

## 2017-10-17 DIAGNOSIS — Z7982 Long term (current) use of aspirin: Secondary | ICD-10-CM | POA: Insufficient documentation

## 2017-10-17 DIAGNOSIS — M545 Low back pain: Secondary | ICD-10-CM | POA: Insufficient documentation

## 2017-10-17 MED ORDER — HYDROCODONE-ACETAMINOPHEN 5-325 MG PO TABS: 1.0000 | ORAL_TABLET | ORAL | 0 refills | Status: DC | PRN

## 2017-10-17 MED ORDER — IBUPROFEN 600 MG PO TABS: 600.0000 mg | ORAL_TABLET | Freq: Four times a day (QID) | ORAL | 0 refills | Status: DC | PRN

## 2017-10-17 NOTE — ED Triage Notes (Signed)
Pt reports having chronic left hip (sciatica per pt) pain and now is having pain going up into her back.

## 2017-10-17 NOTE — ED Notes (Signed)
Pt ambulates heel to toe

## 2017-10-17 NOTE — ED Provider Notes (Signed)
[facility] EMERGENCY DEPARTMENT Provider Note   CSN: [id number] Arrival date & time: 10/17/17  1349     History   Chief Complaint Chief Complaint  Patient presents with  . Hip Pain    HPI [name] is a 56 y.o. female presenting with acute on chronic low back pain which has which has been present for years but worsened for the past week.  Patient denies any new injury specifically.  She describes left lower back pain which radiates through her hip and down the left leg to the lateral calf.  She also reports pain now shooting up into her upper back as well.  There has been no weakness or numbness in the lower extremities and no urinary or bowel retention or incontinence.  She also denies dysuria, abdominal pain, hematuria. Patient does not have a history of cancer or IVDU.  The patient has tried ibuprofen without significant relief of symptoms. .  The history is provided by the patient.    Past Medical History:  Diagnosis Date  . Hypertension   . Obesity   . Sickle cell trait ([place])   . Sleep apnea    Stop Bang score of 4    Patient Active Problem List   Diagnosis Date Noted  . Trichomoniasis 08/29/2014  . Status post total hysterectomy and bilateral salpingo-oophorectomy 07/13/2014  . S/P hysterectomy with oophorectomy 07/10/2014  . H N P-LUMBAR 02/24/2010  . BACK PAIN 02/24/2010  . HIGH BLOOD PRESSURE 02/20/2010    Past Surgical History:  Procedure Laterality Date  . ABDOMINAL HYSTERECTOMY N/A 07/10/2014   Procedure: HYSTERECTOMY ABDOMINAL;  Surgeon: Jonnie Kind, MD;  Location: [facility];  Service: Gynecology;  Laterality: N/A;  . BACK SURGERY    . COLONOSCOPY N/A 07/21/2013   Procedure: COLONOSCOPY;  Surgeon: Danie Binder, MD;  Location: [facility];  Service: Endoscopy;  Laterality: N/A;  10:15 AM  . CYST EXCISION Right    knee  . SALPINGOOPHORECTOMY Bilateral 07/10/2014   Procedure: SALPINGO OOPHORECTOMY;  Surgeon: Jonnie Kind, MD;  Location: [facility];  Service: Gynecology;  Laterality: Bilateral;  . TUBAL LIGATION    . WRIST SURGERY      OB History    Gravida Para Term Preterm AB Living   3 2 2   1      SAB TAB Ectopic Multiple Live Births       1           Home Medications    Prior to Admission medications   Medication Sig Start Date End Date Taking? Authorizing Provider  aspirin 325 MG tablet Take 325 mg by mouth daily.    [provider]  cholecalciferol (VITAMIN D) 1000 UNITS tablet Take 1,000 Units by mouth daily.      [provider]  diphenhydramine-acetaminophen (TYLENOL PM) 25-500 MG TABS Take 1 tablet by mouth at bedtime as needed (Pain, Sleep).    [provider]  fish oil-omega-3 fatty acids 1000 MG capsule Take 1 g by mouth 2 (two) times daily.    [provider]  HYDROcodone-acetaminophen (NORCO/VICODIN) 5-325 MG tablet Take 1 tablet by mouth every 4 (four) hours as needed. 10/17/17   Evalee Jefferson, PA-C  ibuprofen (ADVIL,MOTRIN) 600 MG tablet Take 1 tablet (600 mg total) by mouth every 6 (six) hours as needed. 10/17/17   Evalee Jefferson, PA-C  lisinopril-hydrochlorothiazide (PRINZIDE,ZESTORETIC) 20-12.5 MG per tablet Take 1 tablet by mouth daily.      [provider]  Family History Family History  Problem Relation Age of Onset  . Hypertension Mother   . Sickle cell trait Mother   . Heart attack Father   . Sickle cell anemia Sister   . Sickle cell trait Sister     Social History Social History  Substance Use Topics  . Smoking status: Never Smoker  . Smokeless tobacco: Never Used  . Alcohol use No     Allergies   Oxycodone   Review of Systems Review of Systems  Constitutional: Negative for fever.  Respiratory: Negative for shortness of breath.   Cardiovascular: Negative for chest pain and leg swelling.  Gastrointestinal: Negative for abdominal distention, abdominal pain and constipation.  Genitourinary: Negative for difficulty urinating, dysuria,  flank pain, frequency and urgency.  Musculoskeletal: Positive for back pain. Negative for gait problem and joint swelling.  Skin: Negative for rash.  Neurological: Negative for weakness and numbness.     Physical Exam Updated Vital Signs BP 119/79 (BP Location: Right Arm)   Pulse 63   Temp 98.4 F (36.9 C) (Oral)   Resp 16   Ht 5\' 4"  (1.626 m)   Wt 136.1 kg (300 lb)   LMP 06/19/2014   SpO2 99%   BMI 51.49 kg/m   Physical Exam  Constitutional: She appears well-developed and well-nourished.  Morbid obesity.  HENT:  Head: Normocephalic.  Eyes: Conjunctivae are normal.  Neck: Normal range of motion. Neck supple.  Cardiovascular: Normal rate and intact distal pulses.   Pedal pulses normal.  Pulmonary/Chest: Effort normal and breath sounds normal.  Abdominal: Soft. Bowel sounds are normal. She exhibits no distension and no mass.  Musculoskeletal: Normal range of motion. She exhibits no edema.       Lumbar back: She exhibits tenderness. She exhibits no swelling, no edema and no spasm.  ttp left paralumbar musculature.  No rash.  Neurological: She is alert. She has normal strength. She displays no atrophy and no tremor. No sensory deficit. Gait normal.  Reflex Scores:      Patellar reflexes are 2+ on the right side and 2+ on the left side.      Achilles reflexes are 2+ on the right side and 2+ on the left side. No strength deficit noted in hip and knee flexor and extensor muscle groups.  Ankle flexion and extension intact.  Skin: Skin is warm and dry.  Psychiatric: She has a normal mood and affect.  Nursing note and vitals reviewed.    ED Treatments / Results  Labs (all labs ordered are listed, but only abnormal results are displayed) Labs Reviewed - No data to display  EKG  EKG Interpretation None       Radiology No results found.  Procedures Procedures (including critical care time)  Medications Ordered in ED Medications - No data to display   Initial  Impression / Assessment and Plan / ED Course  I have reviewed the triage vital signs and the nursing notes.  Pertinent labs & imaging results that were available during my care of the patient were reviewed by me and considered in my medical decision making (see chart for details).     No neuro deficit on exam or by history to suggest emergent or surgical presentation.  Also discussed worsened sx that should prompt immediate re-evaluation including distal weakness, bowel/bladder retention/incontinence.  Plan f/u with pcp. She will call tomorrow for an appt, stating it is generally about 3 weeks wait to get. Encouraged to call now for a recheck appt  if sx are not improved with tx.        Final Clinical Impressions(s) / ED Diagnoses   Final diagnoses:  Sciatica of left side    New Prescriptions Discharge Medication List as of 10/17/2017  3:49 PM    START taking these medications   Details  HYDROcodone-acetaminophen (NORCO/VICODIN) 5-325 MG tablet Take 1 tablet by mouth every 4 (four) hours as needed., Starting Sun 10/17/2017, Print    ibuprofen (ADVIL,MOTRIN) 600 MG tablet Take 1 tablet (600 mg total) by mouth every 6 (six) hours as needed., Starting Sun 10/17/2017, Print         Evalee Jefferson, PA-C 10/17/17 1715    Mesner, Corene Cornea, MD 10/18/17 1733

## 2017-10-17 NOTE — ED Notes (Signed)
Pt reports back pain for yearss has not seen her physicians at [facility] nor her back doctor for a long time per her report  She states her low back and sciatia are troubling her but now she has midback pain into her neck also She has taken ibuprofen 800 mg at 1000 without relief and comes to the ED for pain

## 2017-10-17 NOTE — Discharge Instructions (Signed)
Do not drive within 4 hours of taking hydrocodone as this will make you drowsy.  Avoid lifting,  Bending,  Twisting or any other activity that worsens your pain over the next week.  Apply a heating pad to your lower back for 20 minutes several times daily..  You should get rechecked if your symptoms are not better over the next 5 days,  Or you develop increased pain,  Weakness in your leg(s) or loss of bladder or bowel function - these are symptoms of a worse injury.

## 2018-03-27 ENCOUNTER — Emergency Department (HOSPITAL_COMMUNITY)
Admission: EM | Admit: 2018-03-27 | Discharge: 2018-03-27 | Disposition: A | Discharge status: Home / Self Care | Payer: Commercial Managed Care - PPO | Attending: Emergency Medicine | Admitting: Emergency Medicine

## 2018-03-27 ENCOUNTER — Encounter (HOSPITAL_COMMUNITY): Payer: Self-pay | Admitting: Emergency Medicine

## 2018-03-27 ENCOUNTER — Emergency Department (HOSPITAL_COMMUNITY): Payer: Commercial Managed Care - PPO

## 2018-03-27 ENCOUNTER — Other Ambulatory Visit: Payer: Self-pay

## 2018-03-27 DIAGNOSIS — Z79899 Other long term (current) drug therapy: Secondary | ICD-10-CM | POA: Insufficient documentation

## 2018-03-27 DIAGNOSIS — I10 Essential (primary) hypertension: Secondary | ICD-10-CM | POA: Insufficient documentation

## 2018-03-27 DIAGNOSIS — Z7982 Long term (current) use of aspirin: Secondary | ICD-10-CM | POA: Insufficient documentation

## 2018-03-27 DIAGNOSIS — R05 Cough: Secondary | ICD-10-CM | POA: Diagnosis present

## 2018-03-27 DIAGNOSIS — J40 Bronchitis, not specified as acute or chronic: Secondary | ICD-10-CM | POA: Insufficient documentation

## 2018-03-27 HISTORY — DX: Bronchitis, not specified as acute or chronic: J40

## 2018-03-27 MED ORDER — MOMETASONE FUROATE 50 MCG/ACT NA SUSP
2.0000 | Freq: Every day | NASAL | 0 refills | Status: DC
Start: 2018-03-27 — End: 2021-10-02

## 2018-03-27 MED ORDER — PREDNISONE 20 MG PO TABS: 40.0000 mg | ORAL_TABLET | Freq: Every day | ORAL | 0 refills | Status: DC

## 2018-03-27 MED ORDER — BENZONATATE 200 MG PO CAPS: 200.0000 mg | ORAL_CAPSULE | Freq: Three times a day (TID) | ORAL | 0 refills | Status: DC | PRN

## 2018-03-27 MED ORDER — PREDNISONE 20 MG PO TABS
40.0000 mg | ORAL_TABLET | Freq: Once | ORAL | Status: AC
Start: 2018-03-27 — End: 2018-03-27
  Administered 2018-03-27: 40 mg via ORAL
  Filled 2018-03-27: qty 2

## 2018-03-27 MED ORDER — ALBUTEROL SULFATE HFA 108 (90 BASE) MCG/ACT IN AERS
2.0000 | INHALATION_SPRAY | Freq: Once | RESPIRATORY_TRACT | Status: AC
  Administered 2018-03-27: 2 via RESPIRATORY_TRACT
  Filled 2018-03-27: qty 6.7

## 2018-03-27 NOTE — Discharge Instructions (Signed)
2 puffs of the inhaler 4 times a day as needed.  Drink plenty of fluids.  Stop the over-the-counter cough and cold medication.  You may take Tylenol or ibuprofen if needed for body aches or fever.  Follow-up with your primary doctor for recheck or return to the ER for any worsening symptoms

## 2018-03-27 NOTE — ED Triage Notes (Addendum)
Patient c/o cough with sore throat, body aches, nasal congestion, low grade fever, and headache x3 weeks. Per patient cough productive with thick clear sputum. Patient reports taking an antibiotic (amoxicillin) she started on Sunday. Denies any relief. Patient now reports stomach "girgling" with diarrhea and nausea but no vomiting. Per patient took tylenol extra strength at 9am.

## 2018-03-28 NOTE — ED Provider Notes (Signed)
[facility] EMERGENCY DEPARTMENT Provider Note   CSN: [id number] Arrival date & time: 03/27/18  1124     History   Chief Complaint Chief Complaint  Patient presents with  . Cough    HPI [name] is a 57 y.o. female.  HPI   [name] is a 57 y.o. female who presents to the Emergency Department complaining of cough, sore throat, generalized body aches, nasal congestion and low-grade fever.  Symptoms have been waxing and waning for 3 weeks.  Worse for for several days.  She contacted a tele-doc and was prescribed amoxicillin which she is taking 3 times daily for 1 week.  She denies any relief.  She states her cough is occasionally productive and associated with intermittent wheezing.  She denies chest pain or shortness of breath.  She also denies abdominal pain nausea and vomiting.  She does report intermittent diarrhea since taking the antibiotic.  She is tried over-the-counter cough and cold medication without relief.   Past Medical History:  Diagnosis Date  . Bronchitis   . Hypertension   . Obesity   . Sickle cell trait ([place])   . Sleep apnea    Stop Bang score of 4    Patient Active Problem List   Diagnosis Date Noted  . Trichomoniasis 08/29/2014  . Status post total hysterectomy and bilateral salpingo-oophorectomy 07/13/2014  . S/P hysterectomy with oophorectomy 07/10/2014  . H N P-LUMBAR 02/24/2010  . BACK PAIN 02/24/2010  . HIGH BLOOD PRESSURE 02/20/2010    Past Surgical History:  Procedure Laterality Date  . ABDOMINAL HYSTERECTOMY N/A 07/10/2014   Procedure: HYSTERECTOMY ABDOMINAL;  Surgeon: Jonnie Kind, MD;  Location: [facility] ORS;  Service: Gynecology;  Laterality: N/A;  . BACK SURGERY    . COLONOSCOPY N/A 07/21/2013   Procedure: COLONOSCOPY;  Surgeon: Danie Binder, MD;  Location: [facility] ENDO SUITE;  Service: Endoscopy;  Laterality: N/A;  10:15 AM  . CYST EXCISION Right    knee  . SALPINGOOPHORECTOMY Bilateral 07/10/2014   Procedure: SALPINGO  OOPHORECTOMY;  Surgeon: Jonnie Kind, MD;  Location: [facility] ORS;  Service: Gynecology;  Laterality: Bilateral;  . TUBAL LIGATION    . WRIST SURGERY       OB History    Gravida  3   Para  2   Term  2   Preterm      AB  1   Living        SAB      TAB      Ectopic  1   Multiple      Live Births               Home Medications    Prior to Admission medications   Medication Sig Start Date End Date Taking? Authorizing Provider  aspirin 325 MG tablet Take 325 mg by mouth daily.    [provider]  benzonatate (TESSALON) 200 MG capsule Take 1 capsule (200 mg total) by mouth 3 (three) times daily as needed for cough. Swallow whole, do not chew 03/27/18   Billye Pickerel, PA-C  cholecalciferol (VITAMIN D) 1000 UNITS tablet Take 1,000 Units by mouth daily.      [provider]  diphenhydramine-acetaminophen (TYLENOL PM) 25-500 MG TABS Take 1 tablet by mouth at bedtime as needed (Pain, Sleep).    [provider]  fish oil-omega-3 fatty acids 1000 MG capsule Take 1 g by mouth 2 (two) times daily.    [provider]  HYDROcodone-acetaminophen (NORCO/VICODIN)  5-325 MG tablet Take 1 tablet by mouth every 4 (four) hours as needed. 10/17/17   Evalee Jefferson, PA-C  ibuprofen (ADVIL,MOTRIN) 600 MG tablet Take 1 tablet (600 mg total) by mouth every 6 (six) hours as needed. 10/17/17   Evalee Jefferson, PA-C  lisinopril-hydrochlorothiazide (PRINZIDE,ZESTORETIC) 20-12.5 MG per tablet Take 1 tablet by mouth daily.      [provider]  mometasone (NASONEX) 50 MCG/ACT nasal spray Place 2 sprays into the nose daily. 03/27/18   Sylvanna Burggraf, PA-C  predniSONE (DELTASONE) 20 MG tablet Take 2 tablets (40 mg total) by mouth daily. 03/27/18   Kem Parkinson, PA-C    Family History Family History  Problem Relation Age of Onset  . Hypertension Mother   . Sickle cell trait Mother   . Heart attack Father   . Sickle cell anemia Sister   . Sickle cell trait  Sister     Social History Social History   Tobacco Use  . Smoking status: Never Smoker  . Smokeless tobacco: Never Used  Substance Use Topics  . Alcohol use: No  . Drug use: No     Allergies   Oxycodone   Review of Systems Review of Systems  Constitutional: Positive for chills and fever. Negative for appetite change.  HENT: Positive for congestion and sore throat. Negative for trouble swallowing.   Respiratory: Positive for cough and wheezing. Negative for chest tightness and shortness of breath.   Cardiovascular: Negative for chest pain.  Gastrointestinal: Positive for diarrhea. Negative for abdominal pain, nausea and vomiting.  Genitourinary: Negative for dysuria.  Musculoskeletal: Positive for myalgias. Negative for arthralgias.  Skin: Negative for rash.  Neurological: Positive for headaches. Negative for dizziness, weakness and numbness.  Hematological: Negative for adenopathy.  All other systems reviewed and are negative.    Physical Exam Updated Vital Signs BP (!) 159/96 (BP Location: Right Arm)   Pulse 60   Temp 98.5 F (36.9 C) (Oral)   Resp 16   Ht 5\' 4"  (1.626 m)   Wt 136.1 kg (300 lb)   LMP 06/19/2014   SpO2 100%   BMI 51.49 kg/m   Physical Exam  Constitutional: She is oriented to person, place, and time. She appears well-developed and well-nourished. No distress.  HENT:  Head: Normocephalic and atraumatic.  Right Ear: Tympanic membrane and ear canal normal.  Left Ear: Tympanic membrane and ear canal normal.  Nose: Mucosal edema and rhinorrhea present. Right sinus exhibits frontal sinus tenderness. Left sinus exhibits frontal sinus tenderness.  Mouth/Throat: Uvula is midline and mucous membranes are normal. No uvula swelling. Posterior oropharyngeal erythema present. No oropharyngeal exudate, posterior oropharyngeal edema or tonsillar abscesses.  Eyes: Pupils are equal, round, and reactive to light. EOM are normal.  Neck: Normal range of motion,  full passive range of motion without pain and phonation normal. Neck supple.  Cardiovascular: Normal rate, regular rhythm and intact distal pulses.  No murmur heard. Pulmonary/Chest: Effort normal. No stridor. No respiratory distress. She has wheezes. She has no rales. She exhibits no tenderness.  Coarse lungs sounds bilaterally.  Few scattered expiratory wheezes.  No rales.  Abdominal: Soft. She exhibits no distension. There is no tenderness.  Musculoskeletal: Normal range of motion. She exhibits no edema.  Lymphadenopathy:    She has no cervical adenopathy.  Neurological: She is alert and oriented to person, place, and time. She exhibits normal muscle tone. Coordination normal.  Skin: Skin is warm and dry.  Psychiatric: She has a normal mood and affect.  Nursing note and vitals reviewed.    ED Treatments / Results  Labs (all labs ordered are listed, but only abnormal results are displayed) Labs Reviewed - No data to display  EKG None  Radiology Dg Chest 2 View  Result Date: 03/27/2018 CLINICAL DATA:  Cough for 3 weeks. EXAM: CHEST - 2 VIEW COMPARISON:  07/13/2014 FINDINGS: This is a mildly low volume film. UPPER limits normal heart size noted. Peribronchial thickening identified. There is no evidence of focal airspace disease, pulmonary edema, suspicious pulmonary nodule/mass, pleural effusion, or pneumothorax. No acute bony abnormalities are identified. IMPRESSION: Peribronchial thickening/bronchitis changes without evidence of focal pneumonia. Electronically Signed   By: Margarette Canada M.D.   On: 03/27/2018 12:33    Procedures Procedures (including critical care time)  Medications Ordered in ED Medications  albuterol (PROVENTIL HFA;VENTOLIN HFA) 108 (90 Base) MCG/ACT inhaler 2 puff (2 puffs Inhalation Given 03/27/18 1256)  predniSONE (DELTASONE) tablet 40 mg (40 mg Oral Given 03/27/18 1256)     Initial Impression / Assessment and Plan / ED Course  I have reviewed the triage  vital signs and the nursing notes.  Pertinent labs & imaging results that were available during my care of the patient were reviewed by me and considered in my medical decision making (see chart for details).    Pt is non-toxic appearing.  Vitals reassuring.  On recheck, lung sounds improved.  Pt currently taking abx.  Will dispense albuterol MDI and rx for steroid.  Pt agrees to tx plan, return precautions discussed.   Final Clinical Impressions(s) / ED Diagnoses   Final diagnoses:  Bronchitis    ED Discharge Orders        Ordered    mometasone (NASONEX) 50 MCG/ACT nasal spray  Daily     03/27/18 1312    predniSONE (DELTASONE) 20 MG tablet  Daily     03/27/18 1312    benzonatate (TESSALON) 200 MG capsule  3 times daily PRN     03/27/18 1314       Kristofor Michalowski, [place], PA-C 03/28/18 2209    Francine Graven, DO 03/29/18 1258

## 2018-05-03 ENCOUNTER — Other Ambulatory Visit (HOSPITAL_COMMUNITY): Payer: Self-pay | Admitting: Physician Assistant

## 2018-05-03 DIAGNOSIS — Z1231 Encounter for screening mammogram for malignant neoplasm of breast: Secondary | ICD-10-CM

## 2018-05-06 ENCOUNTER — Ambulatory Visit (HOSPITAL_COMMUNITY)
Admission: RE | Admit: 2018-05-06 | Discharge: 2018-05-06 | Disposition: A | Discharge status: Home / Self Care | Payer: Commercial Managed Care - PPO | Source: Ambulatory Visit | Attending: Physician Assistant | Admitting: Physician Assistant

## 2018-05-06 ENCOUNTER — Encounter (HOSPITAL_COMMUNITY): Payer: Self-pay

## 2018-05-06 DIAGNOSIS — Z1231 Encounter for screening mammogram for malignant neoplasm of breast: Secondary | ICD-10-CM | POA: Insufficient documentation

## 2018-07-20 ENCOUNTER — Other Ambulatory Visit (HOSPITAL_COMMUNITY): Payer: Self-pay | Admitting: Physician Assistant

## 2018-07-20 DIAGNOSIS — G5702 Lesion of sciatic nerve, left lower limb: Secondary | ICD-10-CM

## 2018-07-20 DIAGNOSIS — R1032 Left lower quadrant pain: Secondary | ICD-10-CM

## 2018-07-25 ENCOUNTER — Other Ambulatory Visit (HOSPITAL_COMMUNITY): Payer: Self-pay | Admitting: Physician Assistant

## 2018-07-25 DIAGNOSIS — R1032 Left lower quadrant pain: Secondary | ICD-10-CM

## 2018-07-25 DIAGNOSIS — G5702 Lesion of sciatic nerve, left lower limb: Secondary | ICD-10-CM

## 2018-08-05 ENCOUNTER — Ambulatory Visit (HOSPITAL_COMMUNITY): Payer: Commercial Managed Care - PPO

## 2018-08-05 ENCOUNTER — Ambulatory Visit (HOSPITAL_COMMUNITY)
Admission: RE | Admit: 2018-08-05 | Discharge: 2018-08-05 | Disposition: A | Discharge status: Home / Self Care | Payer: Commercial Managed Care - PPO | Source: Ambulatory Visit | Attending: Physician Assistant | Admitting: Physician Assistant

## 2018-08-05 DIAGNOSIS — R1032 Left lower quadrant pain: Secondary | ICD-10-CM | POA: Diagnosis present

## 2018-08-05 DIAGNOSIS — G5702 Lesion of sciatic nerve, left lower limb: Secondary | ICD-10-CM | POA: Insufficient documentation

## 2018-08-05 DIAGNOSIS — M47816 Spondylosis without myelopathy or radiculopathy, lumbar region: Secondary | ICD-10-CM | POA: Diagnosis not present

## 2018-08-11 ENCOUNTER — Ambulatory Visit (HOSPITAL_COMMUNITY): Payer: Commercial Managed Care - PPO

## 2018-08-22 ENCOUNTER — Ambulatory Visit (HOSPITAL_COMMUNITY): Admission: RE | Admit: 2018-08-22 | Payer: Commercial Managed Care - PPO | Source: Ambulatory Visit

## 2018-09-30 NOTE — Patient Instructions (Signed)
Your procedure is scheduled on:10/10/2018                 Report to [facility] at   7:30  AM.  Call this number if you have problems the morning of surgery: [phone]   Remember:   Do not eat or drink :After Midnight.    Take these medicines the morning of surgery with A SIP OF WATER: lisinopril           Do not wear jewelry, make-up or nail polish.  Do not wear lotions, powders, or perfumes. You may wear deodorant.  Do not bring valuables to the hospital.  Contacts, dentures or bridgework may not be worn into surgery.  Patients discharged the day of surgery will not be allowed to drive home.  Name and phone number of your driver:    @[email or web address] @ Cataract Surgery  A cataract is a clouding of the lens of the eye. When a lens becomes cloudy, vision is reduced based on the degree and nature of the clouding. Surgery may be needed to improve vision. Surgery removes the cloudy lens and usually replaces it with a substitute lens (intraocular lens, IOL). LET YOUR EYE DOCTOR KNOW ABOUT:  Allergies to food or medicine.   Medicines taken including herbs, eyedrops, over-the-counter medicines, and creams.   Use of steroids (by mouth or creams).   Previous problems with anesthetics or numbing medicine.   History of bleeding problems or blood clots.   Previous surgery.   Other health problems, including diabetes and kidney problems.   Possibility of pregnancy, if this applies.  RISKS AND COMPLICATIONS  Infection.   Inflammation of the eyeball (endophthalmitis) that can spread to both eyes (sympathetic ophthalmia).   Poor wound healing.   If an IOL is inserted, it can later fall out of proper position. This is very uncommon.   Clouding of the part of your eye that holds an IOL in place. This is called an "after-cataract." These are uncommon, but easily treated.  BEFORE THE PROCEDURE  Do not eat or drink anything except small amounts of water for 8 to 12 before your  surgery, or as directed by your caregiver.   Unless you are told otherwise, continue any eyedrops you have been prescribed.   Talk to your primary caregiver about all other medicines that you take (both prescription and non-prescription). In some cases, you may need to stop or change medicines near the time of your surgery. This is most important if you are taking blood-thinning medicine.Do not stop medicines unless you are told to do so.   Arrange for someone to drive you to and from the procedure.   Do not put contact lenses in either eye on the day of your surgery.  PROCEDURE There is more than one method for safely removing a cataract. Your doctor can explain the differences and help determine which is best for you. Phacoemulsification surgery is the most common form of cataract surgery.  An injection is given behind the eye or eyedrops are given to make this a painless procedure.   A small cut (incision) is made on the edge of the clear, dome-shaped surface that covers the front of the eye (cornea).   A tiny probe is painlessly inserted into the eye. This device gives off ultrasound waves that soften and break up the cloudy center of the lens. This makes it easier for the cloudy lens to be removed by suction.   An IOL may be implanted.  The normal lens of the eye is covered by a clear capsule. Part of that capsule is intentionally left in the eye to support the IOL.   Your surgeon may or may not use stitches to close the incision.  There are other forms of cataract surgery that require a larger incision and stiches to close the eye. This approach is taken in cases where the doctor feels that the cataract cannot be easily removed using phacoemulsification. AFTER THE PROCEDURE  When an IOL is implanted, it does not need care. It becomes a permanent part of your eye and cannot be seen or felt.   Your doctor will schedule follow-up exams to check on your progress.   Review your  other medicines with your doctor to see which can be resumed after surgery.   Use eyedrops or take medicine as prescribed by your doctor.  Document Released: 12/03/2011 Document Reviewed: 11/30/2011 [facility] Patient Information 2012 [place].  .Cataract Surgery Care After Refer to this sheet in the next few weeks. These instructions provide you with information on caring for yourself after your procedure. Your caregiver may also give you more specific instructions. Your treatment has been planned according to current medical practices, but problems sometimes occur. Call your caregiver if you have any problems or questions after your procedure.  HOME CARE INSTRUCTIONS   Avoid strenuous activities as directed by your caregiver.   Ask your caregiver when you can resume driving.   Use eyedrops or other medicines to help healing and control pressure inside your eye as directed by your caregiver.   Only take over-the-counter or prescription medicines for pain, discomfort, or fever as directed by your caregiver.   Do not to touch or rub your eyes.   You may be instructed to use a protective shield during the first few days and nights after surgery. If not, wear sunglasses to protect your eyes. This is to protect the eye from pressure or from being accidentally bumped.   Keep the area around your eye clean and dry. Avoid swimming or allowing water to hit you directly in the face while showering. Keep soap and shampoo out of your eyes.   Do not bend or lift heavy objects. Bending increases pressure in the eye. You can walk, climb stairs, and do light household chores.   Do not put a contact lens into the eye that had surgery until your caregiver says it is okay to do so.   Ask your doctor when you can return to work. This will depend on the kind of work that you do. If you work in a dusty environment, you may be advised to wear protective eyewear for a period of time.   Ask your  caregiver when it will be safe to engage in sexual activity.   Continue with your regular eye exams as directed by your caregiver.  What to expect:  It is normal to feel itching and mild discomfort for a few days after cataract surgery. Some fluid discharge is also common, and your eye may be sensitive to light and touch.   After 1 to 2 days, even moderate discomfort should disappear. In most cases, healing will take about 6 weeks.   If you received an intraocular lens (IOL), you may notice that colors are very bright or have a blue tinge. Also, if you have been in bright sunlight, everything may appear reddish for a few hours. If you see these color tinges, it is because your lens is  clear and no longer cloudy. Within a few months after receiving an IOL, these extra colors should go away. When you have healed, you will probably need new glasses.  SEEK MEDICAL CARE IF:   You have increased bruising around your eye.   You have discomfort not helped by medicine.  SEEK IMMEDIATE MEDICAL CARE IF:   You have a fever.   You have a worsening or sudden vision loss.   You have redness, swelling, or increasing pain in the eye.   You have a thick discharge from the eye that had surgery.  MAKE SURE YOU:  Understand these instructions.   Will watch your condition.   Will get help right away if you are not doing well or get worse.  Document Released: 07/03/2005 Document Revised: 12/03/2011 Document Reviewed: 08/07/2011 [facility] Patient Information 2012 [place].    Monitored Anesthesia Care  Monitored anesthesia care is an anesthesia service for a medical procedure. Anesthesia is the loss of the ability to feel pain. It is produced by medications called anesthetics. It may affect a small area of your body (local anesthesia), a large area of your body (regional anesthesia), or your entire body (general anesthesia). The need for monitored anesthesia care depends your procedure, your  condition, and the potential need for regional or general anesthesia. It is often provided during procedures where:   General anesthesia may be needed if there are complications. This is because you need special care when you are under general anesthesia.   You will be under local or regional anesthesia. This is so that you are able to have higher levels of anesthesia if needed.   You will receive calming medications (sedatives). This is especially the case if sedatives are given to put you in a semi-conscious state of relaxation (deep sedation). This is because the amount of sedative needed to produce this state can be hard to predict. Too much of a sedative can produce general anesthesia. Monitored anesthesia care is performed by one or more caregivers who have special training in all types of anesthesia. You will need to meet with these caregivers before your procedure. During this meeting, they will ask you about your medical history. They will also give you instructions to follow. (For example, you will need to stop eating and drinking before your procedure. You may also need to stop or change medications you are taking.) During your procedure, your caregivers will stay with you. They will:   Watch your condition. This includes watching you blood pressure, breathing, and level of pain.   Diagnose and treat problems that occur.   Give medications if they are needed. These may include calming medications (sedatives) and anesthetics.   Make sure you are comfortable.  Having monitored anesthesia care does not necessarily mean that you will be under anesthesia. It does mean that your caregivers will be able to manage anesthesia if you need it or if it occurs. It also means that you will be able to have a different type of anesthesia than you are having if you need it. When your procedure is complete, your caregivers will continue to watch your condition. They will make sure any medications wear  off before you are allowed to go home.  Document Released: 09/09/2005 Document Revised: 04/10/2013 Document Reviewed: 01/25/2013 [facility] Patient Information 2014 [place], [place].

## 2018-10-05 ENCOUNTER — Encounter (HOSPITAL_COMMUNITY)
Admission: RE | Admit: 2018-10-05 | Discharge: 2018-10-05 | Disposition: A | Discharge status: Home / Self Care | Payer: Commercial Managed Care - PPO | Source: Ambulatory Visit | Attending: Ophthalmology | Admitting: Ophthalmology

## 2018-10-05 ENCOUNTER — Encounter (HOSPITAL_COMMUNITY): Payer: Self-pay

## 2018-10-06 ENCOUNTER — Encounter (HOSPITAL_COMMUNITY)
Admission: RE | Admit: 2018-10-06 | Discharge: 2018-10-06 | Disposition: A | Discharge status: Home / Self Care | Payer: Commercial Managed Care - PPO | Source: Ambulatory Visit | Attending: Ophthalmology | Admitting: Ophthalmology

## 2018-10-06 ENCOUNTER — Other Ambulatory Visit: Payer: Self-pay

## 2018-10-06 ENCOUNTER — Encounter (HOSPITAL_COMMUNITY): Payer: Self-pay

## 2018-10-06 DIAGNOSIS — Z01818 Encounter for other preprocedural examination: Secondary | ICD-10-CM | POA: Insufficient documentation

## 2018-10-06 LAB — BASIC METABOLIC PANEL
ANION GAP: 12 (ref 5–15)
BUN: 13 mg/dL (ref 6–20)
CALCIUM: 9.8 mg/dL (ref 8.9–10.3)
CO2: 23 mmol/L (ref 22–32)
Chloride: 105 mmol/L (ref 98–111)
Creatinine, Ser: 0.8 mg/dL (ref 0.44–1.00)
GFR calc Af Amer: >60 mL/min (ref >60)
GLUCOSE: 88 mg/dL (ref 70–99)
Potassium: 3.8 mmol/L (ref 3.5–5.1)
Sodium: 140 mmol/L (ref 135–145)

## 2018-10-06 LAB — CBC WITH DIFFERENTIAL/PLATELET
Abs Immature Granulocytes: 0.01 10*3/uL (ref 0.00–0.07)
BASOS PCT: 0 %
Basophils Absolute: 0 10*3/uL (ref 0.0–0.1)
EOS ABS: 0.2 10*3/uL (ref 0.0–0.5)
EOS PCT: 2 %
HCT: 38.9 % (ref 36.0–46.0)
Hemoglobin: 11.9 g/dL — ABNORMAL LOW (ref 12.0–15.0)
Immature Granulocytes: 0 %
Lymphocytes Relative: 31 %
Lymphs Abs: 2.8 10*3/uL (ref 0.7–4.0)
MCH: 25.6 pg — AB (ref 26.0–34.0)
MCHC: 30.6 g/dL (ref 30.0–36.0)
MCV: 83.7 fL (ref 80.0–100.0)
MONOS PCT: 10 %
Monocytes Absolute: 0.9 10*3/uL (ref 0.1–1.0)
Neutro Abs: 5 10*3/uL (ref 1.7–7.7)
Neutrophils Relative %: 57 %
PLATELETS: 275 10*3/uL (ref 150–400)
RBC: 4.65 MIL/uL (ref 3.87–5.11)
RDW: 14 % (ref 11.5–15.5)
WBC: 9 10*3/uL (ref 4.0–10.5)
nRBC: 0 % (ref 0.0–0.2)

## 2018-10-10 ENCOUNTER — Encounter (HOSPITAL_COMMUNITY): Payer: Self-pay | Admitting: Anesthesiology

## 2018-10-10 ENCOUNTER — Encounter (HOSPITAL_COMMUNITY)
Admission: RE | Disposition: A | Discharge status: Home / Self Care | Payer: Self-pay | Source: Ambulatory Visit | Attending: Ophthalmology

## 2018-10-10 ENCOUNTER — Ambulatory Visit (HOSPITAL_COMMUNITY): Payer: Commercial Managed Care - PPO | Admitting: Anesthesiology

## 2018-10-10 ENCOUNTER — Ambulatory Visit (HOSPITAL_COMMUNITY)
Admission: RE | Admit: 2018-10-10 | Discharge: 2018-10-10 | Disposition: A | Discharge status: Home / Self Care | Payer: Commercial Managed Care - PPO | Source: Ambulatory Visit | Attending: Ophthalmology | Admitting: Ophthalmology

## 2018-10-10 DIAGNOSIS — Z7982 Long term (current) use of aspirin: Secondary | ICD-10-CM | POA: Diagnosis not present

## 2018-10-10 DIAGNOSIS — H2512 Age-related nuclear cataract, left eye: Secondary | ICD-10-CM | POA: Diagnosis not present

## 2018-10-10 DIAGNOSIS — I10 Essential (primary) hypertension: Secondary | ICD-10-CM | POA: Insufficient documentation

## 2018-10-10 DIAGNOSIS — Z79899 Other long term (current) drug therapy: Secondary | ICD-10-CM | POA: Diagnosis not present

## 2018-10-10 HISTORY — PX: CATARACT EXTRACTION W/PHACO: SHX586

## 2018-10-10 SURGERY — PHACOEMULSIFICATION, CATARACT, WITH IOL INSERTION
Anesthesia: Monitor Anesthesia Care | Site: Eye | Laterality: Left

## 2018-10-10 MED ORDER — EPINEPHRINE PF 1 MG/ML IJ SOLN
INTRAOCULAR | Status: DC | PRN
  Administered 2018-10-10: 500 mL

## 2018-10-10 MED ORDER — POVIDONE-IODINE 5 % OP SOLN
OPHTHALMIC | Status: DC | PRN
  Administered 2018-10-10: 1 via OPHTHALMIC

## 2018-10-10 MED ORDER — NEOMYCIN-POLYMYXIN-DEXAMETH 3.5-10000-0.1 OP SUSP
OPHTHALMIC | Status: DC | PRN
  Administered 2018-10-10: 2 [drp] via OPHTHALMIC

## 2018-10-10 MED ORDER — PHENYLEPHRINE HCL 2.5 % OP SOLN
1.0000 [drp] | OPHTHALMIC | Status: AC
  Administered 2018-10-10 (×3): 1 [drp] via OPHTHALMIC

## 2018-10-10 MED ORDER — TETRACAINE HCL 0.5 % OP SOLN
1.0000 [drp] | OPHTHALMIC | Status: AC
  Administered 2018-10-10 (×3): 1 [drp] via OPHTHALMIC

## 2018-10-10 MED ORDER — CYCLOPENTOLATE-PHENYLEPHRINE 0.2-1 % OP SOLN
1.0000 [drp] | OPHTHALMIC | Status: AC
  Administered 2018-10-10 (×3): 1 [drp] via OPHTHALMIC

## 2018-10-10 MED ORDER — EPINEPHRINE PF 1 MG/ML IJ SOLN
INTRAMUSCULAR | Status: AC
  Filled 2018-10-10: qty 1

## 2018-10-10 MED ORDER — BSS IO SOLN
INTRAOCULAR | Status: DC | PRN
  Administered 2018-10-10: 15 mL via INTRAOCULAR

## 2018-10-10 MED ORDER — MIDAZOLAM HCL 2 MG/2ML IJ SOLN
INTRAMUSCULAR | Status: AC
  Filled 2018-10-10: qty 2

## 2018-10-10 MED ORDER — NA HYALUR & NA CHOND-NA HYALUR 0.55-0.5 ML IO KIT
PACK | INTRAOCULAR | Status: DC | PRN
  Administered 2018-10-10: 1 via OPHTHALMIC

## 2018-10-10 MED ORDER — LACTATED RINGERS IV SOLN
INTRAVENOUS | Status: DC
  Administered 2018-10-10: 1000 mL via INTRAVENOUS

## 2018-10-10 MED ORDER — LIDOCAINE HCL 3.5 % OP GEL
1.0000 "application " | Freq: Once | OPHTHALMIC | Status: AC
  Administered 2018-10-10: 1 via OPHTHALMIC

## 2018-10-10 MED ORDER — LIDOCAINE HCL (PF) 1 % IJ SOLN
INTRAMUSCULAR | Status: DC | PRN
  Administered 2018-10-10: .9 mL

## 2018-10-10 SURGICAL SUPPLY — 12 items
CLOTH BEACON ORANGE TIMEOUT ST (SAFETY) ×2 IMPLANT
EYE SHIELD UNIVERSAL CLEAR (GAUZE/BANDAGES/DRESSINGS) ×2 IMPLANT
GLOVE BIOGEL PI IND STRL 7.0 (GLOVE) IMPLANT
GLOVE BIOGEL PI INDICATOR 7.0 (GLOVE) ×4
LENS ALC ACRYL/TECN (Ophthalmic Related) ×2 IMPLANT
NDL HYPO 18GX1.5 BLUNT FILL (NEEDLE) IMPLANT
NEEDLE HYPO 18GX1.5 BLUNT FILL (NEEDLE) ×3 IMPLANT
PAD ARMBOARD 7.5X6 YLW CONV (MISCELLANEOUS) ×2 IMPLANT
SYRINGE LUER LOK 1CC (MISCELLANEOUS) ×2 IMPLANT
TAPE SURG TRANSPORE 1 IN (GAUZE/BANDAGES/DRESSINGS) IMPLANT
TAPE SURGICAL TRANSPORE 1 IN (GAUZE/BANDAGES/DRESSINGS) ×2
WATER STERILE IRR 250ML POUR (IV SOLUTION) ×2 IMPLANT

## 2018-10-10 NOTE — Op Note (Signed)
Date of Admission: 10/10/2018  Date of Surgery: 10/10/2018  Pre-Op Dx: Cataract Left  Eye  Post-Op Dx: Senile Nuclear Cataract  Left  Eye,  Dx Code H25.12  Surgeon: Tonny Branch, M.D.  Assistants: None  Anesthesia: Topical with MAC  Indications: Painless, progressive loss of vision with compromise of daily activities.  Surgery: Cataract Extraction with Intraocular lens Implant Left Eye  Discription: The patient had dilating drops and viscous lidocaine placed into the Left eye in the pre-op holding area. After transfer to the operating room, a time out was performed. The patient was then prepped and draped. Beginning with a 38m blade a paracentesis port was made at the surgeon's 2 o'clock position. The anterior chamber was then filled with 1% non-preserved lidocaine. This was followed by filling the anterior chamber with Provisc.  A 2.45mkeratome blade was used to make a clear corneal incision at the temporal limbus.  A bent cystatome needle was used to create a continuous tear capsulotomy. Hydrodissection was performed with balanced salt solution on a Fine canula. The lens nucleus was then removed using the phacoemulsification handpiece. Residual cortex was removed with the I&A handpiece. The anterior chamber and capsular bag were refilled with Provisc. A posterior chamber intraocular lens was placed into the capsular bag with it's injector. The implant was positioned with the Kuglan hook. The Provisc was then removed from the anterior chamber and capsular bag with the I&A handpiece. Stromal hydration of the main incision and paracentesis port was performed with BSS on a Fine canula. The wounds were tested for leak which was negative. The patient tolerated the procedure well. There were no operative complications. The patient was then transferred to the recovery room in stable condition.  Complications: None  Specimen: None  EBL: None  Prosthetic device: J[employer]J Technis, PCB00, power 17.5, SN  [id number]

## 2018-10-10 NOTE — Anesthesia Preprocedure Evaluation (Signed)
Anesthesia Evaluation  Patient identified by MRN, date of birth, ID band Patient awake    Reviewed: Allergy & Precautions, H&P , NPO status , Patient's Chart, lab work & pertinent test results  Airway Mallampati: II  TM Distance: >3 FB Neck ROM: full    Dental no notable dental hx.    Pulmonary neg pulmonary ROS,    Pulmonary exam normal breath sounds clear to auscultation       Cardiovascular Exercise Tolerance: Good hypertension, negative cardio ROS   Rhythm:regular Rate:Normal     Neuro/Psych negative neurological ROS  negative psych ROS   GI/Hepatic negative GI ROS, Neg liver ROS,   Endo/Other  negative endocrine ROS  Renal/GU negative Renal ROS  negative genitourinary   Musculoskeletal   Abdominal   Peds  Hematology negative hematology ROS (+)   Anesthesia Other Findings   Reproductive/Obstetrics negative OB ROS                             Anesthesia Physical Anesthesia Plan  ASA: III  Anesthesia Plan: MAC   Post-op Pain Management:    Induction:   PONV Risk Score and Plan:   Airway Management Planned:   Additional Equipment:   Intra-op Plan:   Post-operative Plan:   Informed Consent: I have reviewed the patients History and Physical, chart, labs and discussed the procedure including the risks, benefits and alternatives for the proposed anesthesia with the patient or authorized representative who has indicated his/her understanding and acceptance.   Dental Advisory Given  Plan Discussed with: CRNA  Anesthesia Plan Comments:         Anesthesia Quick Evaluation

## 2018-10-10 NOTE — H&P (Signed)
I have reviewed the H&P, the patient was re-examined, and I have identified no interval changes in medical condition and plan of care since the history and physical of record  

## 2018-10-10 NOTE — Transfer of Care (Signed)
Immediate Anesthesia Transfer of Care Note  Patient: [name]  Procedure(s) Performed: CATARACT EXTRACTION PHACO AND INTRAOCULAR LENS PLACEMENT (IOC) (Left Eye)  Patient Location: Short Stay  Anesthesia Type:MAC  Level of Consciousness: awake  Airway & Oxygen Therapy: Patient Spontanous Breathing  Post-op Assessment: Report given to RN  Post vital signs: Reviewed  Last Vitals:  Vitals Value Taken Time  BP    Temp    Pulse    Resp    SpO2      Last Pain:  Vitals:   10/10/18 0807  PainSc: 0-No pain         Complications: No apparent anesthesia complications

## 2018-10-10 NOTE — Discharge Instructions (Signed)

## 2018-10-10 NOTE — Anesthesia Postprocedure Evaluation (Signed)
Anesthesia Post Note  Patient: [name]  Procedure(s) Performed: CATARACT EXTRACTION PHACO AND INTRAOCULAR LENS PLACEMENT (IOC) (Left Eye)  Patient location during evaluation: Short Stay Anesthesia Type: MAC Level of consciousness: awake and alert and oriented Pain management: pain level controlled Vital Signs Assessment: post-procedure vital signs reviewed and stable Respiratory status: spontaneous breathing Cardiovascular status: stable Postop Assessment: no apparent nausea or vomiting Anesthetic complications: no     Last Vitals:  Vitals:   10/10/18 0807  BP: 125/80  Pulse: 63  Resp: 16  Temp: 36.8 C  SpO2: 96%    Last Pain:  Vitals:   10/10/18 0807  PainSc: 0-No pain                 Zacari Radick

## 2018-10-11 ENCOUNTER — Encounter (HOSPITAL_COMMUNITY): Payer: Self-pay | Admitting: Ophthalmology

## 2018-10-24 ENCOUNTER — Encounter (HOSPITAL_COMMUNITY): Payer: Self-pay

## 2018-10-24 ENCOUNTER — Encounter (HOSPITAL_COMMUNITY)
Admission: RE | Admit: 2018-10-24 | Discharge: 2018-10-24 | Disposition: A | Discharge status: Home / Self Care | Payer: Commercial Managed Care - PPO | Source: Ambulatory Visit | Attending: Ophthalmology | Admitting: Ophthalmology

## 2018-10-26 MED ORDER — LIDOCAINE HCL (PF) 1 % IJ SOLN
INTRAMUSCULAR | Status: AC
  Filled 2018-10-26: qty 2

## 2018-10-26 MED ORDER — LIDOCAINE HCL 3.5 % OP GEL
OPHTHALMIC | Status: AC
  Filled 2018-10-26: qty 1

## 2018-10-26 MED ORDER — CYCLOPENTOLATE-PHENYLEPHRINE 0.2-1 % OP SOLN
OPHTHALMIC | Status: AC
  Filled 2018-10-26: qty 2

## 2018-10-26 MED ORDER — PHENYLEPHRINE HCL 2.5 % OP SOLN
OPHTHALMIC | Status: AC
  Filled 2018-10-26: qty 15

## 2018-10-26 MED ORDER — TETRACAINE HCL 0.5 % OP SOLN
OPHTHALMIC | Status: AC
  Filled 2018-10-26: qty 4

## 2018-10-26 MED ORDER — NEOMYCIN-POLYMYXIN-DEXAMETH 3.5-10000-0.1 OP SUSP
OPHTHALMIC | Status: AC
  Filled 2018-10-26: qty 5

## 2018-10-27 ENCOUNTER — Encounter (HOSPITAL_COMMUNITY): Payer: Self-pay | Admitting: *Deleted

## 2018-10-27 ENCOUNTER — Ambulatory Visit (HOSPITAL_COMMUNITY)
Admission: RE | Admit: 2018-10-27 | Discharge: 2018-10-27 | Disposition: A | Discharge status: Home / Self Care | Payer: Commercial Managed Care - PPO | Source: Ambulatory Visit | Attending: Ophthalmology | Admitting: Ophthalmology

## 2018-10-27 ENCOUNTER — Encounter (HOSPITAL_COMMUNITY)
Admission: RE | Disposition: A | Discharge status: Home / Self Care | Payer: Self-pay | Source: Ambulatory Visit | Attending: Ophthalmology

## 2018-10-27 ENCOUNTER — Ambulatory Visit (HOSPITAL_COMMUNITY): Payer: Commercial Managed Care - PPO | Admitting: Anesthesiology

## 2018-10-27 DIAGNOSIS — Z9842 Cataract extraction status, left eye: Secondary | ICD-10-CM | POA: Insufficient documentation

## 2018-10-27 DIAGNOSIS — H2511 Age-related nuclear cataract, right eye: Secondary | ICD-10-CM | POA: Insufficient documentation

## 2018-10-27 DIAGNOSIS — Z79899 Other long term (current) drug therapy: Secondary | ICD-10-CM | POA: Diagnosis not present

## 2018-10-27 DIAGNOSIS — Z961 Presence of intraocular lens: Secondary | ICD-10-CM | POA: Diagnosis not present

## 2018-10-27 DIAGNOSIS — Z7982 Long term (current) use of aspirin: Secondary | ICD-10-CM | POA: Diagnosis not present

## 2018-10-27 DIAGNOSIS — I10 Essential (primary) hypertension: Secondary | ICD-10-CM | POA: Diagnosis not present

## 2018-10-27 HISTORY — PX: CATARACT EXTRACTION W/PHACO: SHX586

## 2018-10-27 SURGERY — PHACOEMULSIFICATION, CATARACT, WITH IOL INSERTION
Anesthesia: Monitor Anesthesia Care | Site: Eye | Laterality: Right

## 2018-10-27 MED ORDER — BSS IO SOLN
INTRAOCULAR | Status: DC | PRN
  Administered 2018-10-27: 15 mL

## 2018-10-27 MED ORDER — POVIDONE-IODINE 5 % OP SOLN
OPHTHALMIC | Status: DC | PRN
  Administered 2018-10-27: 1 via OPHTHALMIC

## 2018-10-27 MED ORDER — CYCLOPENTOLATE-PHENYLEPHRINE 0.2-1 % OP SOLN
1.0000 [drp] | OPHTHALMIC | Status: AC
  Administered 2018-10-27 (×3): 1 [drp] via OPHTHALMIC

## 2018-10-27 MED ORDER — NA HYALUR & NA CHOND-NA HYALUR 0.55-0.5 ML IO KIT
PACK | INTRAOCULAR | Status: DC | PRN
  Administered 2018-10-27: 1 via OPHTHALMIC

## 2018-10-27 MED ORDER — LACTATED RINGERS IV SOLN
INTRAVENOUS | Status: DC
  Administered 2018-10-27: 07:00:00 via INTRAVENOUS

## 2018-10-27 MED ORDER — EPINEPHRINE PF 1 MG/ML IJ SOLN
INTRAMUSCULAR | Status: AC
  Filled 2018-10-27: qty 1

## 2018-10-27 MED ORDER — LIDOCAINE HCL 3.5 % OP GEL
1.0000 "application " | Freq: Once | OPHTHALMIC | Status: AC
  Administered 2018-10-27: 1 via OPHTHALMIC

## 2018-10-27 MED ORDER — NEOMYCIN-POLYMYXIN-DEXAMETH 3.5-10000-0.1 OP SUSP
OPHTHALMIC | Status: DC | PRN
  Administered 2018-10-27: 1 [drp] via OPHTHALMIC

## 2018-10-27 MED ORDER — TETRACAINE HCL 0.5 % OP SOLN
1.0000 [drp] | OPHTHALMIC | Status: AC
  Administered 2018-10-27 (×3): 1 [drp] via OPHTHALMIC

## 2018-10-27 MED ORDER — LIDOCAINE HCL (PF) 1 % IJ SOLN
INTRAOCULAR | Status: DC | PRN
  Administered 2018-10-27: .8 mL via OPHTHALMIC

## 2018-10-27 MED ORDER — PHENYLEPHRINE HCL 2.5 % OP SOLN
1.0000 [drp] | OPHTHALMIC | Status: AC
  Administered 2018-10-27 (×3): 1 [drp] via OPHTHALMIC

## 2018-10-27 MED ORDER — EPINEPHRINE PF 1 MG/ML IJ SOLN
INTRAOCULAR | Status: DC | PRN
  Administered 2018-10-27: 500 mL

## 2018-10-27 SURGICAL SUPPLY — 10 items
CLOTH BEACON ORANGE TIMEOUT ST (SAFETY) ×2 IMPLANT
EYE SHIELD UNIVERSAL CLEAR (GAUZE/BANDAGES/DRESSINGS) ×2 IMPLANT
GLOVE BIOGEL PI IND STRL 7.0 (GLOVE) ×2 IMPLANT
GLOVE BIOGEL PI INDICATOR 7.0 (GLOVE) ×2
PAD ARMBOARD 7.5X6 YLW CONV (MISCELLANEOUS) ×2 IMPLANT
SIGHTPATH CAT PROC W REG LENS (Ophthalmic Related) ×2 IMPLANT
SYRINGE LUER LOK 1CC (MISCELLANEOUS) ×2 IMPLANT
TAPE SURG TRANSPORE 1 IN (GAUZE/BANDAGES/DRESSINGS) ×1 IMPLANT
TAPE SURGICAL TRANSPORE 1 IN (GAUZE/BANDAGES/DRESSINGS) ×1
WATER STERILE IRR 250ML POUR (IV SOLUTION) ×2 IMPLANT

## 2018-10-27 NOTE — Anesthesia Postprocedure Evaluation (Signed)
Anesthesia Post Note  Patient: [name]  Procedure(s) Performed: CATARACT EXTRACTION PHACO AND INTRAOCULAR LENS PLACEMENT RIGHT EYE  (Right Eye)  Patient location during evaluation: Short Stay Anesthesia Type: MAC Level of consciousness: awake and alert and patient cooperative Pain management: pain level controlled Vital Signs Assessment: post-procedure vital signs reviewed and stable Respiratory status: spontaneous breathing, nonlabored ventilation and respiratory function stable Postop Assessment: no apparent nausea or vomiting Anesthetic complications: no     Last Vitals: There were no vitals filed for this visit.  Last Pain: There were no vitals filed for this visit.               Johnnetta Holstine J

## 2018-10-27 NOTE — Op Note (Signed)
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  Assistants: None  Anesthesia: Topical with MAC  Indications: Painless, progressive loss of vision with compromise of daily activities.  Surgery: Cataract Extraction with Intraocular lens Implant Right Eye  Discription: The patient had dilating drops and viscous lidocaine placed into the Right eye in the pre-op holding area. After transfer to the operating room, a time out was performed. The patient was then prepped and draped. Beginning with a 60m blade a paracentesis port was made at the surgeon's 2 o'clock position. The anterior chamber was then filled with 1% non-preserved lidocaine. This was followed by filling the anterior chamber with Provisc.  A 2.411mkeratome blade was used to make a clear corneal incision at the temporal limbus.  A bent cystatome needle was used to create a continuous tear capsulotomy. Hydrodissection was performed with balanced salt solution on a Fine canula. The lens nucleus was then removed using the phacoemulsification handpiece. Residual cortex was removed with the I&A handpiece. The anterior chamber and capsular bag were refilled with Provisc. A posterior chamber intraocular lens was placed into the capsular bag with it's injector. The implant was positioned with the Kuglan hook. The Provisc was then removed from the anterior chamber and capsular bag with the I&A handpiece. Stromal hydration of the main incision and paracentesis port was performed with BSS on a Fine canula. The wounds were tested for leak which was negative. The patient tolerated the procedure well. There were no operative complications. The patient was then transferred to the recovery room in stable condition.  Complications: None  Specimen: None  EBL: None  Prosthetic device: J&J Technis, PCB00, power 14.0,  SN [id number]

## 2018-10-27 NOTE — Anesthesia Preprocedure Evaluation (Signed)
Anesthesia Evaluation  Patient identified by MRN, date of birth, ID band Patient awake    Reviewed: Allergy & Precautions, NPO status , Patient's Chart, lab work & pertinent test results  Airway Mallampati: II  TM Distance: >3 FB Neck ROM: Full    Dental no notable dental hx. (+) Teeth Intact   Pulmonary neg pulmonary ROS,  Denies OSA or albuterol use since 2018   Pulmonary exam normal breath sounds clear to auscultation       Cardiovascular Exercise Tolerance: Good hypertension, Pt. on medications negative cardio ROS Normal cardiovascular examI Rhythm:Regular Rate:Normal     Neuro/Psych negative neurological ROS  negative psych ROS   GI/Hepatic negative GI ROS, Neg liver ROS,   Endo/Other  Morbid obesityBMI > 50 -SMO  Renal/GU negative Renal ROS  negative genitourinary   Musculoskeletal negative musculoskeletal ROS (+)   Abdominal   Peds negative pediatric ROS (+)  Hematology negative hematology ROS (+)   Anesthesia Other Findings   Reproductive/Obstetrics negative OB ROS                             Anesthesia Physical Anesthesia Plan  ASA: III  Anesthesia Plan: MAC   Post-op Pain Management:    Induction:   PONV Risk Score and Plan:   Airway Management Planned: Nasal Cannula and Simple Face Mask  Additional Equipment:   Intra-op Plan:   Post-operative Plan:   Informed Consent:   Plan Discussed with:   Anesthesia Plan Comments:         Anesthesia Quick Evaluation

## 2018-10-27 NOTE — Discharge Instructions (Signed)
Moderate Conscious Sedation, Adult, Care After  These instructions provide you with information about caring for yourself after your procedure. Your health care provider may also give you more specific instructions. Your treatment has been planned according to current medical practices, but problems sometimes occur. Call your health care provider if you have any problems or questions after your procedure.  What can I expect after the procedure?  After your procedure, it is common:   To feel sleepy for several hours.   To feel clumsy and have poor balance for several hours.   To have poor judgment for several hours.   To vomit if you eat too soon.    Follow these instructions at home:  For at least 24 hours after the procedure:     Do not:  ? Participate in activities where you could fall or become injured.  ? Drive.  ? Use heavy machinery.  ? Drink alcohol.  ? Take sleeping pills or medicines that cause drowsiness.  ? Make important decisions or sign legal documents.  ? Take care of children on your own.   Rest.  Eating and drinking   Follow the diet recommended by your health care provider.   If you vomit:  ? Drink water, juice, or soup when you can drink without vomiting.  ? Make sure you have little or no nausea before eating solid foods.  General instructions   Have a responsible adult stay with you until you are awake and alert.   Take over-the-counter and prescription medicines only as told by your health care provider.   If you smoke, do not smoke without supervision.   Keep all follow-up visits as told by your health care provider. This is important.  Contact a health care provider if:   You keep feeling nauseous or you keep vomiting.   You feel light-headed.   You develop a rash.   You have a fever.  Get help right away if:   You have trouble breathing.  This information is not intended to replace advice given to you by your health care provider. Make sure you discuss any questions you have  with your health care provider.  Document Released: 10/04/2013 Document Revised: 05/18/2016 Document Reviewed: 04/04/2016  Elsevier Interactive Patient Education  2018 [employer]

## 2018-10-27 NOTE — Transfer of Care (Signed)
Immediate Anesthesia Transfer of Care Note  Patient: [name]  Procedure(s) Performed: CATARACT EXTRACTION PHACO AND INTRAOCULAR LENS PLACEMENT RIGHT EYE  (Right Eye)  Patient Location: PACU  Anesthesia Type:MAC  Level of Consciousness: awake and patient cooperative  Airway & Oxygen Therapy: Patient Spontanous Breathing  Post-op Assessment: Report given to RN, Post -op Vital signs reviewed and stable and Patient moving all extremities  Post vital signs: Reviewed and stable  Last Vitals:  Vitals Value Taken Time  BP    Temp    Pulse    Resp    SpO2      Last Pain: There were no vitals filed for this visit.       Complications: No apparent anesthesia complications

## 2018-10-27 NOTE — H&P (Signed)
I have reviewed the H&P, the patient was re-examined, and I have identified no interval changes in medical condition and plan of care since the history and physical of record  

## 2018-10-28 ENCOUNTER — Encounter (HOSPITAL_COMMUNITY): Payer: Self-pay | Admitting: Ophthalmology

## 2019-06-15 ENCOUNTER — Other Ambulatory Visit (HOSPITAL_COMMUNITY): Payer: Self-pay | Admitting: Physician Assistant

## 2019-06-15 DIAGNOSIS — Z1231 Encounter for screening mammogram for malignant neoplasm of breast: Secondary | ICD-10-CM

## 2019-06-23 ENCOUNTER — Ambulatory Visit (HOSPITAL_COMMUNITY)
Admission: RE | Admit: 2019-06-23 | Discharge: 2019-06-23 | Disposition: A | Discharge status: Home / Self Care | Payer: Commercial Managed Care - PPO | Source: Ambulatory Visit | Attending: Physician Assistant | Admitting: Physician Assistant

## 2019-06-23 ENCOUNTER — Other Ambulatory Visit: Payer: Self-pay

## 2019-06-23 DIAGNOSIS — Z1231 Encounter for screening mammogram for malignant neoplasm of breast: Secondary | ICD-10-CM | POA: Diagnosis present

## 2020-02-25 ENCOUNTER — Ambulatory Visit: Payer: Commercial Managed Care - PPO | Attending: Internal Medicine

## 2020-02-25 DIAGNOSIS — Z23 Encounter for immunization: Secondary | ICD-10-CM | POA: Insufficient documentation

## 2020-02-25 NOTE — Progress Notes (Signed)
   Covid-19 Vaccination Clinic  Name:  [name]    MRN: [id number] DOB: 01-07-1961  02/25/2020  [name] was observed post Covid-19 immunization for 15 minutes without incidence. She was provided with Vaccine Information Sheet and instruction to access the V-Safe system.   [name] was instructed to call 911 with any severe reactions post vaccine: Marland Kitchen Difficulty breathing  . Swelling of your face and throat  . A fast heartbeat  . A bad rash all over your body  . Dizziness and weakness    Immunizations Administered    Name Date Dose VIS Date Route   Moderna COVID-19 Vaccine 02/25/2020  9:11 AM 0.5 mL 11/28/2019 Intramuscular   Manufacturer: Moderna   Lot: [id number]   [id number]

## 2020-02-27 ENCOUNTER — Ambulatory Visit: Payer: Commercial Managed Care - PPO

## 2020-03-30 ENCOUNTER — Other Ambulatory Visit: Payer: Self-pay

## 2020-03-30 ENCOUNTER — Ambulatory Visit: Payer: Commercial Managed Care - PPO | Attending: Internal Medicine

## 2020-03-30 DIAGNOSIS — Z23 Encounter for immunization: Secondary | ICD-10-CM

## 2020-03-30 NOTE — Progress Notes (Signed)
Did not stop by observation table after vaccination to check out.

## 2020-05-21 ENCOUNTER — Other Ambulatory Visit: Payer: Self-pay | Admitting: Orthopedic Surgery

## 2020-05-21 DIAGNOSIS — R2241 Localized swelling, mass and lump, right lower limb: Secondary | ICD-10-CM

## 2020-07-19 ENCOUNTER — Other Ambulatory Visit: Payer: Self-pay

## 2020-07-19 ENCOUNTER — Ambulatory Visit (HOSPITAL_COMMUNITY)
Admission: RE | Admit: 2020-07-19 | Discharge: 2020-07-19 | Disposition: A | Discharge status: Home / Self Care | Payer: Commercial Managed Care - PPO | Source: Ambulatory Visit | Attending: Orthopedic Surgery | Admitting: Orthopedic Surgery

## 2020-07-19 DIAGNOSIS — R2241 Localized swelling, mass and lump, right lower limb: Secondary | ICD-10-CM | POA: Insufficient documentation

## 2020-07-19 MED ORDER — GADOBUTROL 1 MMOL/ML IV SOLN
10.0000 mL | Freq: Once | INTRAVENOUS | Status: AC | PRN
  Administered 2020-07-19: 10 mL via INTRAVENOUS

## 2020-11-15 ENCOUNTER — Ambulatory Visit
Admission: EM | Admit: 2020-11-15 | Discharge: 2020-11-15 | Disposition: A | Discharge status: Home / Self Care | Payer: Commercial Managed Care - PPO | Attending: Internal Medicine | Admitting: Internal Medicine

## 2020-11-15 ENCOUNTER — Other Ambulatory Visit: Payer: Self-pay

## 2020-11-15 DIAGNOSIS — M79672 Pain in left foot: Secondary | ICD-10-CM

## 2020-11-15 MED ORDER — DICLOFENAC SODIUM 1 % EX GEL: 2.0000 g | Freq: Four times a day (QID) | CUTANEOUS | Status: DC

## 2020-11-15 NOTE — ED Triage Notes (Signed)
Pt presents with left foot pain that began over a week ago, pain is mostly in left heel , denies injury .

## 2020-11-15 NOTE — Discharge Instructions (Signed)
Avoid wearing your house shoes until you heal Do the foot stretches more often Follow up with your family Dr to be sent to  physical therapy if you dont improve

## 2020-11-15 NOTE — ED Provider Notes (Signed)
[facility]    CSN: [id number] Arrival date & time: 11/15/20  1339      History   Chief Complaint Chief Complaint  Patient presents with  . Foot Pain    HPI [name] is a 59 y.o. female who presents with L heel pain x 5 days. She denies injuring herself. Has had plantar fasciitis in the past and is similar to that pain. Has been doing foot stretches when she wakes up in the am. She does not walk bare footed but wears house slippers. Pain is present on the bottom of her heel and around it. Has not noticed redness or swelling.     Past Medical History:  Diagnosis Date  . Bronchitis   . Hypertension   . Obesity   . Sickle cell trait [facility])     Patient Active Problem List   Diagnosis Date Noted  . Trichomoniasis 08/29/2014  . Status post total hysterectomy and bilateral salpingo-oophorectomy 07/13/2014  . S/P hysterectomy with oophorectomy 07/10/2014  . H N P-LUMBAR 02/24/2010  . BACK PAIN 02/24/2010  . HIGH BLOOD PRESSURE 02/20/2010    Past Surgical History:  Procedure Laterality Date  . ABDOMINAL HYSTERECTOMY N/A 07/10/2014   Procedure: HYSTERECTOMY ABDOMINAL;  Surgeon: Jonnie Kind, MD;  Location: [facility];  Service: Gynecology;  Laterality: N/A;  . BACK SURGERY    . CATARACT EXTRACTION W/PHACO Left 10/10/2018   Procedure: CATARACT EXTRACTION PHACO AND INTRAOCULAR LENS PLACEMENT (IOC);  Surgeon: Tonny Branch, MD;  Location: [facility];  Service: Ophthalmology;  Laterality: Left;  CDE: 2.63  . CATARACT EXTRACTION W/PHACO Right 10/27/2018   Procedure: CATARACT EXTRACTION PHACO AND INTRAOCULAR LENS PLACEMENT RIGHT EYE ;  Surgeon: Tonny Branch, MD;  Location: [facility];  Service: Ophthalmology;  Laterality: Right;  right  . COLONOSCOPY N/A 07/21/2013   Procedure: COLONOSCOPY;  Surgeon: Danie Binder, MD;  Location: [facility];  Service: Endoscopy;  Laterality: N/A;  10:15 AM  . CYST EXCISION Right    knee  . SALPINGOOPHORECTOMY Bilateral 07/10/2014    Procedure: SALPINGO OOPHORECTOMY;  Surgeon: Jonnie Kind, MD;  Location: [facility];  Service: Gynecology;  Laterality: Bilateral;  . TUBAL LIGATION    . WRIST SURGERY      OB History    Gravida  3   Para  2   Term  2   Preterm      AB  1   Living        SAB      TAB      Ectopic  1   Multiple      Live Births               Home Medications    Prior to Admission medications   Medication Sig Start Date End Date Taking? Authorizing Provider  albuterol (PROVENTIL HFA;VENTOLIN HFA) 108 (90 Base) MCG/ACT inhaler Inhale 2 puffs into the lungs every 6 (six) hours as needed for wheezing or shortness of breath.    [provider]  aspirin 325 MG tablet Take 325 mg by mouth daily.    [provider]  atorvastatin (LIPITOR) 20 MG tablet Take 20 mg by mouth daily.    [provider]  cholecalciferol (VITAMIN D) 1000 UNITS tablet Take 1,000 Units by mouth daily.      [provider]  fish oil-omega-3 fatty acids 1000 MG capsule Take 1 g by mouth 2 (two) times daily.    [provider]  lisinopril-hydrochlorothiazide (PRINZIDE,ZESTORETIC) 20-12.5 MG per tablet Take 1 tablet by mouth daily.      [provider]  mometasone (NASONEX) 50 MCG/ACT nasal spray Place 2 sprays into the nose daily. Patient taking differently: Place 2 sprays into the nose daily as needed (allergies).  03/27/18   Kem Parkinson, PA-C    Family History Family History  Problem Relation Age of Onset  . Hypertension Mother   . Sickle cell trait Mother   . Heart attack Father   . Sickle cell anemia Sister   . Sickle cell trait Sister     Social History Social History   Tobacco Use  . Smoking status: Never Smoker  . Smokeless tobacco: Never Used  Vaping Use  . Vaping Use: Never used  Substance Use Topics  . Alcohol use: No  . Drug use: No     Allergies   Oxycodone   Review of Systems Review of Systems  Musculoskeletal:       + for  heel pain  Skin: Negative for rash and wound.  Neurological: Negative for numbness.     Physical Exam Triage Vital Signs ED Triage Vitals  Enc Vitals Group     BP 11/15/20 1350 96/65     Pulse Rate 11/15/20 1350 89     Resp 11/15/20 1350 18     Temp 11/15/20 1350 98 F (36.7 C)     Temp src --      SpO2 11/15/20 1350 98 %     Weight --      Height --      Head Circumference --      Peak Flow --      Pain Score 11/15/20 1347 10     Pain Loc --      Pain Edu? --      Excl. in [place]? --    No data found.  Updated Vital Signs BP 96/65   Pulse 89   Temp 98 F (36.7 C)   Resp 18   LMP 06/19/2014   SpO2 98%   Visual Acuity Right Eye Distance:   Left Eye Distance:   Bilateral Distance:    Right Eye Near:   Left Eye Near:    Bilateral Near:     Physical Exam Vitals and nursing note reviewed.  Constitutional:      General: She is not in acute distress.    Appearance: She is obese. She is not toxic-appearing.  HENT:     Right Ear: External ear normal.     Left Ear: External ear normal.  Eyes:     General: No scleral icterus.    Conjunctiva/sclera: Conjunctivae normal.  Pulmonary:     Effort: Pulmonary effort is normal.  Musculoskeletal:        General: No deformity or signs of injury. Normal range of motion.     Right lower leg: No edema.     Left lower leg: No edema.     Comments: R heel with local tenderness on the sole and borders, as well as on the fascia on the arch of the foot closest to her heel.   Skin:    General: Skin is warm and dry.     Findings: No rash.  Neurological:     Mental Status: She is alert and oriented to person, place, and time.     Gait: Gait normal.  Psychiatric:        Mood and Affect: Mood normal.  Behavior: Behavior normal.        Thought Content: Thought content normal.        Judgment: Judgment normal.      UC Treatments / Results  Labs (all labs ordered are listed, but only abnormal results are displayed) Labs  Reviewed - No data to display  EKG   Radiology No results found.  Procedures Procedures (including critical care time)  Medications Ordered in UC Medications  diclofenac Sodium (VOLTAREN) 1 % topical gel 2 g (has no administration in time range)    Initial Impression / Assessment and Plan / UC Course  I have reviewed the triage vital signs and the nursing notes. Is having recurrent L plantar fasciitis.  Advised to do the foot stretches after every time she has been sitting for 30 min or longer, needs to wear good support shoes and I placed her on Voltaren gel as noted.  Needs to Fu with PCP if pain persists. See instructions Final Clinical Impressions(s) / UC Diagnoses   Final diagnoses:  Inflammatory pain of left heel     Discharge Instructions     Avoid wearing your house shoes until you heal Do the foot stretches more often Follow up with your family Dr to be sent to  physical therapy if you dont improve   ED Prescriptions    None     PDMP not reviewed this encounter.   Shelby Mattocks, PA-C 11/15/20 1408

## 2020-11-16 ENCOUNTER — Telehealth: Payer: Self-pay | Admitting: Emergency Medicine

## 2020-11-16 MED ORDER — DICLOFENAC SODIUM 1 % EX GEL: 2.0000 g | Freq: Four times a day (QID) | CUTANEOUS | Status: AC

## 2020-11-17 ENCOUNTER — Emergency Department (HOSPITAL_COMMUNITY): Payer: Commercial Managed Care - PPO

## 2020-11-17 ENCOUNTER — Emergency Department (HOSPITAL_COMMUNITY)
Admission: EM | Admit: 2020-11-17 | Discharge: 2020-11-17 | Disposition: A | Discharge status: Home / Self Care | Payer: Commercial Managed Care - PPO | Attending: Emergency Medicine | Admitting: Emergency Medicine

## 2020-11-17 ENCOUNTER — Other Ambulatory Visit: Payer: Self-pay

## 2020-11-17 ENCOUNTER — Encounter (HOSPITAL_COMMUNITY): Payer: Self-pay | Admitting: Emergency Medicine

## 2020-11-17 DIAGNOSIS — M7752 Other enthesopathy of left foot: Secondary | ICD-10-CM

## 2020-11-17 DIAGNOSIS — M25579 Pain in unspecified ankle and joints of unspecified foot: Secondary | ICD-10-CM | POA: Diagnosis present

## 2020-11-17 DIAGNOSIS — M7732 Calcaneal spur, left foot: Secondary | ICD-10-CM | POA: Insufficient documentation

## 2020-11-17 DIAGNOSIS — I10 Essential (primary) hypertension: Secondary | ICD-10-CM | POA: Diagnosis not present

## 2020-11-17 DIAGNOSIS — Z7901 Long term (current) use of anticoagulants: Secondary | ICD-10-CM | POA: Diagnosis not present

## 2020-11-17 NOTE — Discharge Instructions (Addendum)
Schedule to see the Podiatrist for evaluation.  

## 2020-11-17 NOTE — ED Provider Notes (Signed)
[facility] EMERGENCY DEPARTMENT Provider Note   CSN: [id number] Arrival date & time: 11/17/20  1617     History Chief Complaint  Patient presents with  . Ankle Pain    [name] is a 59 y.o. female.  The history is provided by the patient. No language interpreter was used.  Ankle Pain Location:  Ankle and foot Time since incident:  2 weeks Injury: yes   Mechanism of injury comment:  Twist Ankle location:  L ankle Foot location:  L foot Pain details:    Quality:  Aching   Radiates to:  Does not radiate   Severity:  Moderate   Onset quality:  Gradual Chronicity:  New Relieved by:  Nothing Worsened by:  Nothing Ineffective treatments:  None tried Risk factors: no concern for non-accidental trauma        Past Medical History:  Diagnosis Date  . Bronchitis   . Hypertension   . Obesity   . Sickle cell trait [facility])     Patient Active Problem List   Diagnosis Date Noted  . Trichomoniasis 08/29/2014  . Status post total hysterectomy and bilateral salpingo-oophorectomy 07/13/2014  . S/P hysterectomy with oophorectomy 07/10/2014  . H N P-LUMBAR 02/24/2010  . BACK PAIN 02/24/2010  . HIGH BLOOD PRESSURE 02/20/2010    Past Surgical History:  Procedure Laterality Date  . ABDOMINAL HYSTERECTOMY N/A 07/10/2014   Procedure: HYSTERECTOMY ABDOMINAL;  Surgeon: Jonnie Kind, MD;  Location: [facility];  Service: Gynecology;  Laterality: N/A;  . BACK SURGERY    . CATARACT EXTRACTION W/PHACO Left 10/10/2018   Procedure: CATARACT EXTRACTION PHACO AND INTRAOCULAR LENS PLACEMENT (IOC);  Surgeon: Tonny Branch, MD;  Location: [facility];  Service: Ophthalmology;  Laterality: Left;  CDE: 2.63  . CATARACT EXTRACTION W/PHACO Right 10/27/2018   Procedure: CATARACT EXTRACTION PHACO AND INTRAOCULAR LENS PLACEMENT RIGHT EYE ;  Surgeon: Tonny Branch, MD;  Location: [facility];  Service: Ophthalmology;  Laterality: Right;  right  . COLONOSCOPY N/A 07/21/2013   Procedure: COLONOSCOPY;   Surgeon: Danie Binder, MD;  Location[facility];  Service: Endoscopy;  Laterality: N/A;  10:15 AM  . CYST EXCISION Right    knee  . SALPINGOOPHORECTOMY Bilateral 07/10/2014   Procedure: SALPINGO OOPHORECTOMY;  Surgeon: Jonnie Kind, MD;  Location: [facility];  Service: Gynecology;  Laterality: Bilateral;  . TUBAL LIGATION    . WRIST SURGERY       OB History    Gravida  3   Para  2   Term  2   Preterm      AB  1   Living        SAB      TAB      Ectopic  1   Multiple      Live Births              Family History  Problem Relation Age of Onset  . Hypertension Mother   . Sickle cell trait Mother   . Heart attack Father   . Sickle cell anemia Sister   . Sickle cell trait Sister     Social History   Tobacco Use  . Smoking status: Never Smoker  . Smokeless tobacco: Never Used  Vaping Use  . Vaping Use: Never used  Substance Use Topics  . Alcohol use: No  . Drug use: No    Home Medications Prior to Admission medications   Medication Sig Start Date End Date Taking? Authorizing Provider  aspirin 325 MG tablet Take 325 mg by mouth daily.   Yes [provider]  atorvastatin (LIPITOR) 20 MG tablet Take 20 mg by mouth daily.   Yes [provider]  cholecalciferol (VITAMIN D) 1000 UNITS tablet Take 1,000 Units by mouth daily.     Yes [provider]  fish oil-omega-3 fatty acids 1000 MG capsule Take 1 g by mouth 2 (two) times daily.   Yes [provider]  lisinopril-hydrochlorothiazide (PRINZIDE,ZESTORETIC) 20-12.5 MG per tablet Take 1 tablet by mouth daily.     Yes [provider]  meloxicam (MOBIC) 15 MG tablet Take 15 mg by mouth daily. 10/24/20  Yes [provider]  albuterol (PROVENTIL HFA;VENTOLIN HFA) 108 (90 Base) MCG/ACT inhaler Inhale 2 puffs into the lungs every 6 (six) hours as needed for wheezing or shortness of breath. Patient not taking: Reported on 11/17/2020    [provider]   mometasone (NASONEX) 50 MCG/ACT nasal spray Place 2 sprays into the nose daily. Patient not taking: Reported on 11/17/2020 03/27/18   Kem Parkinson, PA-C    Allergies    Oxycodone  Review of Systems   Review of Systems  All other systems reviewed and are negative.   Physical Exam Updated Vital Signs BP 123/86 (BP Location: Right Arm)   Pulse 80   Temp 98.3 F (36.8 C) (Oral)   Resp 18   Ht 5\' 4"  (1.626 m)   Wt (!) 145.2 kg   LMP 06/19/2014   SpO2 96%   BMI 54.93 kg/m   Physical Exam Vitals and nursing note reviewed.  HENT:     Head: Normocephalic.  Eyes:     Pupils: Pupils are equal, round, and reactive to light.  Cardiovascular:     Rate and Rhythm: Normal rate.     Pulses: Normal pulses.  Abdominal:     General: Abdomen is flat.  Musculoskeletal:        General: Normal range of motion.     Comments: Tender heel lateral ankle and mid foot from nv and ns intact  Skin:    General: Skin is warm.  Neurological:     General: No focal deficit present.  Psychiatric:        Mood and Affect: Mood normal.     ED Results / Procedures / Treatments   Labs (all labs ordered are listed, but only abnormal results are displayed) Labs Reviewed - No data to display  EKG None  Radiology DG Ankle Complete Left  Result Date: 11/17/2020 CLINICAL DATA:  Ankle and foot pain.  No known injury. EXAM: LEFT ANKLE COMPLETE - 3+ VIEW COMPARISON:  None. FINDINGS: Lateral soft tissue swelling. No fracture, subluxation or dislocation. Joint spaces maintained. Plantar calcaneal spur. IMPRESSION: No acute bony abnormality. Electronically Signed   By: Rolm Baptise M.D.   On: 11/17/2020 18:31   DG Foot Complete Left  Result Date: 11/17/2020 CLINICAL DATA:  Ankle and foot pain, no known injury EXAM: LEFT FOOT - COMPLETE 3+ VIEW COMPARISON:  None. FINDINGS: Plantar calcaneal spur. No acute bony abnormality. Specifically, no fracture, subluxation, or dislocation. IMPRESSION: No acute bony  abnormality. Electronically Signed   By: Rolm Baptise M.D.   On: 11/17/2020 18:31    Procedures Procedures (including critical care time)  Medications Ordered in ED Medications - No data to display  ED Course  I have reviewed the triage vital signs and the nursing notes.  Pertinent labs & imaging results that were available during my care  of the patient were reviewed by me and considered in my medical decision making (see chart for details).    MDM Rules/Calculators/A&P                          MDM:  Odette Horns shows a bone spur on calcaneous,  No fracture  Final Clinical Impression(s) / ED Diagnoses Final diagnoses:  Bone spur of left foot    Rx / DC Orders ED Discharge Orders    None    An After Visit Summary was printed and given to the patient.    Fransico Meadow, [place] 11/17/20 1839    Fredia Sorrow, MD 12/03/20 [phone]

## 2020-11-17 NOTE — ED Triage Notes (Signed)
Pt c/o ankle and foot pain that began two weeks ago. Denies know injury.

## 2020-12-01 ENCOUNTER — Telehealth: Payer: Self-pay

## 2020-12-01 MED ORDER — DICLOFENAC SODIUM 1 % EX GEL: 2.0000 g | Freq: Four times a day (QID) | CUTANEOUS | Status: AC

## 2020-12-01 NOTE — Telephone Encounter (Signed)
Per [facility], The NP, okay to refill Diclofenac. Pt verbalized understanding.

## 2021-01-14 ENCOUNTER — Other Ambulatory Visit (HOSPITAL_COMMUNITY): Payer: Self-pay | Admitting: Physician Assistant

## 2021-01-14 DIAGNOSIS — Z1231 Encounter for screening mammogram for malignant neoplasm of breast: Secondary | ICD-10-CM

## 2021-01-23 ENCOUNTER — Ambulatory Visit (HOSPITAL_COMMUNITY): Payer: Commercial Managed Care - PPO

## 2021-01-29 ENCOUNTER — Other Ambulatory Visit: Payer: Self-pay

## 2021-01-29 ENCOUNTER — Ambulatory Visit
Admission: EM | Admit: 2021-01-29 | Discharge: 2021-01-29 | Disposition: A | Discharge status: Home / Self Care | Payer: Commercial Managed Care - PPO | Attending: Emergency Medicine | Admitting: Emergency Medicine

## 2021-01-29 DIAGNOSIS — R42 Dizziness and giddiness: Secondary | ICD-10-CM | POA: Diagnosis not present

## 2021-01-29 DIAGNOSIS — J069 Acute upper respiratory infection, unspecified: Secondary | ICD-10-CM

## 2021-01-29 DIAGNOSIS — H6593 Unspecified nonsuppurative otitis media, bilateral: Secondary | ICD-10-CM

## 2021-01-29 MED ORDER — PREDNISONE 10 MG PO TABS: 20.0000 mg | ORAL_TABLET | Freq: Every day | ORAL | 0 refills | Status: DC

## 2021-01-29 MED ORDER — CETIRIZINE HCL 10 MG PO TABS: 10.0000 mg | ORAL_TABLET | Freq: Every day | ORAL | 0 refills | Status: DC

## 2021-01-29 MED ORDER — FLUTICASONE PROPIONATE 50 MCG/ACT NA SUSP: 1.0000 | Freq: Every day | NASAL | 0 refills | Status: DC

## 2021-01-29 NOTE — ED Provider Notes (Signed)
[place]   [id number] 01/29/21 Arrival Time: 5176   CC: URI  SUBJECTIVE: History from: patient.  [name] is a 60 y.o. female who who presented to the urgent care with a complaint of nasal congestion, dizziness that started this past Sunday.  Denies sick exposure to COVID, flu or strep.  Reported two home Covid negative tes.  T denies recent travel.  Has tried *OTC medication without relief.  Denies alleviating or aggravating factors.  Denies previous symptoms in the past.   Denies fever, chills, fatigue, sinus pain, rhinorrhea, sore throat, SOB, wheezing, chest pain, nausea, changes in bowel or bladder habits.     ROS: As per HPI.  All other pertinent ROS negative.      Past Medical History:  Diagnosis Date  . Bronchitis   . Hypertension   . Obesity   . Sickle cell trait [facility])    Past Surgical History:  Procedure Laterality Date  . ABDOMINAL HYSTERECTOMY N/A 07/10/2014   Procedure: HYSTERECTOMY ABDOMINAL;  Surgeon: Jonnie Kind, MD;  Location: [facility] ORS;  Service: Gynecology;  Laterality: N/A;  . BACK SURGERY    . CATARACT EXTRACTION W/PHACO Left 10/10/2018   Procedure: CATARACT EXTRACTION PHACO AND INTRAOCULAR LENS PLACEMENT (IOC);  Surgeon: Tonny Branch, MD;  Location: [facility] ORS;  Service: Ophthalmology;  Laterality: Left;  CDE: 2.63  . CATARACT EXTRACTION W/PHACO Right 10/27/2018   Procedure: CATARACT EXTRACTION PHACO AND INTRAOCULAR LENS PLACEMENT RIGHT EYE ;  Surgeon: Tonny Branch, MD;  Location: [facility] ORS;  Service: Ophthalmology;  Laterality: Right;  right  . COLONOSCOPY N/A 07/21/2013   Procedure: COLONOSCOPY;  Surgeon: Danie Binder, MD;  Location: [facility] ENDO SUITE;  Service: Endoscopy;  Laterality: N/A;  10:15 AM  . CYST EXCISION Right    knee  . SALPINGOOPHORECTOMY Bilateral 07/10/2014   Procedure: SALPINGO OOPHORECTOMY;  Surgeon: Jonnie Kind, MD;  Location: [facility] ORS;  Service: Gynecology;  Laterality: Bilateral;  . TUBAL LIGATION    . WRIST SURGERY      Allergies  Allergen Reactions  . Oxycodone Shortness Of Breath   Current Facility-Administered Medications on File Prior to Encounter  Medication Dose Route Frequency Provider Last Rate Last Admin  . diclofenac Sodium (VOLTAREN) 1 % topical gel 2 g  2 g Topical QID Rodriguez-Southworth, Sunday Spillers, PA-C       Current Outpatient Medications on File Prior to Encounter  Medication Sig Dispense Refill  . albuterol (PROVENTIL HFA;VENTOLIN HFA) 108 (90 Base) MCG/ACT inhaler Inhale 2 puffs into the lungs every 6 (six) hours as needed for wheezing or shortness of breath. (Patient not taking: Reported on 11/17/2020)    . aspirin 325 MG tablet Take 325 mg by mouth daily.    Marland Kitchen atorvastatin (LIPITOR) 20 MG tablet Take 20 mg by mouth daily.    . cholecalciferol (VITAMIN D) 1000 UNITS tablet Take 1,000 Units by mouth daily.      . fish oil-omega-3 fatty acids 1000 MG capsule Take 1 g by mouth 2 (two) times daily.    Marland Kitchen lisinopril-hydrochlorothiazide (PRINZIDE,ZESTORETIC) 20-12.5 MG per tablet Take 1 tablet by mouth daily.      . meloxicam (MOBIC) 15 MG tablet Take 15 mg by mouth daily.    . mometasone (NASONEX) 50 MCG/ACT nasal spray Place 2 sprays into the nose daily. (Patient not taking: Reported on 11/17/2020) 17 g 0   Social History   Socioeconomic History  . Marital status: Married    Spouse name: Not on file  .  Number of children: Not on file  . Years of education: Not on file  . Highest education level: Not on file  Occupational History  . Not on file  Tobacco Use  . Smoking status: Never Smoker  . Smokeless tobacco: Never Used  Vaping Use  . Vaping Use: Never used  Substance and Sexual Activity  . Alcohol use: No  . Drug use: No  . Sexual activity: Not Currently    Birth control/protection: Surgical  Other Topics Concern  . Not on file  Social History Narrative  . Not on file   Social Determinants of Health   Financial Resource Strain: Not on file  Food Insecurity: Not on file   Transportation Needs: Not on file  Physical Activity: Not on file  Stress: Not on file  Social Connections: Not on file  Intimate Partner Violence: Not on file   Family History  Problem Relation Age of Onset  . Hypertension Mother   . Sickle cell trait Mother   . Heart attack Father   . Sickle cell anemia Sister   . Sickle cell trait Sister     OBJECTIVE:  Vitals:   01/29/21 1246  BP: 139/85  Pulse: 66  Resp: 20  Temp: (!) 97.4 F (36.3 C)  SpO2: 97%     Physical Exam Vitals and nursing note reviewed.  Constitutional:      General: She is not in acute distress.    Appearance: Normal appearance. She is normal weight. She is not ill-appearing, toxic-appearing or diaphoretic.  HENT:     Head: Normocephalic.     Right Ear: A middle ear effusion is present.     Left Ear: A middle ear effusion is present.     Nose: Congestion present.  Cardiovascular:     Rate and Rhythm: Normal rate and regular rhythm.     Pulses: Normal pulses.     Heart sounds: Normal heart sounds. No murmur heard. No friction rub. No gallop.   Pulmonary:     Effort: Pulmonary effort is normal. No respiratory distress.     Breath sounds: Normal breath sounds. No stridor. No wheezing, rhonchi or rales.  Chest:     Chest wall: No tenderness.  Neurological:     General: No focal deficit present.     Mental Status: She is alert and oriented to person, place, and time.     Cranial Nerves: No cranial nerve deficit.     Sensory: No sensory deficit.     Motor: No weakness.     Coordination: Coordination normal.     Gait: Gait normal.     Deep Tendon Reflexes: Reflexes normal.     LABS:  No results found for this or any previous visit (from the past 24 hour(s)).   ASSESSMENT & PLAN:  1. URI, acute   2. Middle ear effusion, bilateral   3. Dizziness     Meds ordered this encounter  Medications  . fluticasone (FLONASE) 50 MCG/ACT nasal spray    Sig: Place 1 spray into both nostrils daily  for 14 days.    Dispense:  16 g    Refill:  0  . cetirizine (ZYRTEC ALLERGY) 10 MG tablet    Sig: Take 1 tablet (10 mg total) by mouth daily.    Dispense:  30 tablet    Refill:  0  . predniSONE (DELTASONE) 10 MG tablet    Sig: Take 2 tablets (20 mg total) by mouth daily.    Dispense:  15 tablet    Refill:  0   Patient is stable at discharge.  Dizziness is likely caused from middle ear effusion.  Will prescribe prednisone and Flonase.  Zyrtec will be prescribed for nasal congestion.  Discharge Instructions  Get plenty of rest and push fluids Zyrtec prescribed for nasal congestion Flonase and prednisone were prescribed for middle ear effusion/dizziness Use medications daily for symptom relief Use OTC medications like ibuprofen or tylenol as needed fever or pain Call or go to the ED if you have any new or worsening symptoms such as fever, worsening cough, shortness of breath, chest tightness, chest pain, turning blue, changes in mental status, etc...   Reviewed expectations re: course of current medical issues. Questions answered. Outlined signs and symptoms indicating need for more acute intervention. Patient verbalized understanding. After Visit Summary given.         Emerson Monte, [place] 01/29/21 1332

## 2021-01-29 NOTE — ED Triage Notes (Signed)
2 negative covid test  

## 2021-01-29 NOTE — ED Triage Notes (Signed)
Pt presents with c/o sinus congestion and dizziness that began on Sunday. Dizziness with movement only

## 2021-01-29 NOTE — Discharge Instructions (Addendum)
Get plenty of rest and push fluids Zyrtec prescribed for nasal congestion Flonase and prednisone were prescribed for middle ear effusion/dizziness Use medications daily for symptom relief Use OTC medications like ibuprofen or tylenol as needed fever or pain Call or go to the ED if you have any new or worsening symptoms such as fever, worsening cough, shortness of breath, chest tightness, chest pain, turning blue, changes in mental status, etc.

## 2021-03-12 ENCOUNTER — Other Ambulatory Visit: Payer: Self-pay

## 2021-03-12 ENCOUNTER — Ambulatory Visit (HOSPITAL_COMMUNITY)
Admission: RE | Admit: 2021-03-12 | Discharge: 2021-03-12 | Disposition: A | Discharge status: Home / Self Care | Payer: Commercial Managed Care - PPO | Source: Ambulatory Visit | Attending: Physician Assistant | Admitting: Physician Assistant

## 2021-03-12 DIAGNOSIS — Z1231 Encounter for screening mammogram for malignant neoplasm of breast: Secondary | ICD-10-CM | POA: Diagnosis present

## 2021-09-08 ENCOUNTER — Other Ambulatory Visit: Payer: Self-pay | Admitting: General Surgery

## 2021-09-08 ENCOUNTER — Other Ambulatory Visit (HOSPITAL_COMMUNITY): Payer: Self-pay | Admitting: General Surgery

## 2021-09-10 ENCOUNTER — Other Ambulatory Visit: Payer: Self-pay | Admitting: Podiatry

## 2021-09-10 ENCOUNTER — Other Ambulatory Visit (HOSPITAL_COMMUNITY): Payer: Self-pay | Admitting: Podiatry

## 2021-09-10 DIAGNOSIS — M76822 Posterior tibial tendinitis, left leg: Secondary | ICD-10-CM

## 2021-09-12 ENCOUNTER — Ambulatory Visit (INDEPENDENT_AMBULATORY_CARE_PROVIDER_SITE_OTHER): Payer: Commercial Managed Care - PPO | Admitting: Internal Medicine

## 2021-09-12 ENCOUNTER — Other Ambulatory Visit: Payer: Self-pay

## 2021-09-12 ENCOUNTER — Encounter: Payer: Self-pay | Admitting: Internal Medicine

## 2021-09-12 VITALS — BP 138/82 | HR 69 | Ht 64.0 in | Wt 328.2 lb

## 2021-09-12 DIAGNOSIS — Z01810 Encounter for preprocedural cardiovascular examination: Secondary | ICD-10-CM | POA: Diagnosis not present

## 2021-09-12 NOTE — Progress Notes (Signed)
Cardiology Office Note   Date:  09/12/2021   ID:  [name], DOB [birth year], MRN [id number]  PCP:  Cory Munch, PA-C  Cardiologist:   Dorris Carnes, MD   Pt referred fro preop risk evaluation      History of Present Illness: [name] is a 60 y.o. female with a history of morbid obesity  Being evaluated for gastric bypass  She has a hx of HTN, HL, DM    The pt denies CP   Breathing is OK   Able to do housework without problem    No PND  No palpitations     Current Meds  Medication Sig   albuterol (PROVENTIL HFA;VENTOLIN HFA) 108 (90 Base) MCG/ACT inhaler Inhale 2 puffs into the lungs every 6 (six) hours as needed for wheezing or shortness of breath.   aspirin 325 MG tablet Take 325 mg by mouth daily.   atorvastatin (LIPITOR) 20 MG tablet Take 20 mg by mouth daily.   cetirizine (ZYRTEC ALLERGY) 10 MG tablet Take 1 tablet (10 mg total) by mouth daily.   cholecalciferol (VITAMIN D) 1000 UNITS tablet Take 1,000 Units by mouth daily.     fish oil-omega-3 fatty acids 1000 MG capsule Take 1 g by mouth 2 (two) times daily.   lisinopril-hydrochlorothiazide (PRINZIDE,ZESTORETIC) 20-12.5 MG per tablet Take 1 tablet by mouth daily.     meloxicam (MOBIC) 15 MG tablet Take 15 mg by mouth daily.   mometasone (NASONEX) 50 MCG/ACT nasal spray Place 2 sprays into the nose daily.   predniSONE (DELTASONE) 10 MG tablet Take 2 tablets (20 mg total) by mouth daily.   Current Facility-Administered Medications for the 09/12/21 encounter (Office Visit) with Fay Records, MD  Medication   diclofenac Sodium (VOLTAREN) 1 % topical gel 2 g     Allergies:   Oxycodone   Past Medical History:  Diagnosis Date   Bronchitis    Hypertension    Obesity    Sickle cell trait [facility])     Past Surgical History:  Procedure Laterality Date   ABDOMINAL HYSTERECTOMY N/A 07/10/2014   Procedure: HYSTERECTOMY ABDOMINAL;  Surgeon: Jonnie Kind, MD;  Location: [facility] ORS;  Service: Gynecology;   Laterality: N/A;   BACK SURGERY     CATARACT EXTRACTION W/PHACO Left 10/10/2018   Procedure: CATARACT EXTRACTION PHACO AND INTRAOCULAR LENS PLACEMENT ([place]);  Surgeon: Tonny Branch, MD;  Location: [facility] ORS;  Service: Ophthalmology;  Laterality: Left;  CDE: 2.63   CATARACT EXTRACTION W/PHACO Right 10/27/2018   Procedure: CATARACT EXTRACTION PHACO AND INTRAOCULAR LENS PLACEMENT RIGHT EYE ;  Surgeon: Tonny Branch, MD;  Location: [facility] ORS;  Service: Ophthalmology;  Laterality: Right;  right   COLONOSCOPY N/A 07/21/2013   Procedure: COLONOSCOPY;  Surgeon: Danie Binder, MD;  Location: [facility] ENDO SU[facility];  Service: Endoscopy;  Laterality: N/A;  10:15 AM   CYST EXCISION Right    knee   SALPINGOOPHORECTOMY Bilateral 07/10/2014   Procedure: SALPINGO OOPHORECTOMY;  Surgeon: Jonnie Kind, MD;  Location: [facility] ORS;  Service: Gynecology;  Laterality: Bilateral;   TUBAL LIGATION     WRIST SURGERY       Social History:  The patient  reports that she has never smoked. She has never used smokeless tobacco. She reports that she does not drink alcohol and does not use drugs.   Family History:  The patient's family history includes Heart attack in her father; Hypertension in her mother; Sickle cell anemia in her sister; Sickle  cell trait in her mother and sister.    ROS:  Please see the history of present illness. All other systems are reviewed and  Negative to the above problem except as noted.    PHYSICAL EXAM: VS:  BP 138/82   Pulse 69   Ht '5\' 4"'$  (1.626 m)   Wt (!) 328 lb 3.2 oz (148.9 kg)   LMP 06/19/2014   SpO2 99%   BMI 56.34 kg/m   GEN: MOrbidly obese 60 in no acute distress  HEENT: normal  Neck: no JVD, carotid bruits,  Cardiac: RRR; no murmurs, No LE edema  Respiratory:  clear to auscultation bilaterally, GI: soft, nontender, nondistended, + BS  No hepatomegaly  MS: no deformity Moving all extremities   Skin: warm and dry, no rash Neuro:  Strength and sensation are intact Psych: euthymic mood,  full affect   EKG:  EKG is not ordered today.   Lipid Panel No results found for: CHOL, TRIG, HDL, CHOLHDL, VLDL, LDLCALC, LDLDIRECT    Wt Readings from Last 3 Encounters:  09/12/21 (!) 328 lb 3.2 oz (148.9 kg)  11/17/20 (!) 320 lb (145.2 kg)  10/06/18 (!) 315 lb (142.9 kg)      ASSESSMENT AND PLAN:  1  Preop risk assessment   Pt being evaluated for gastric bypass.   No symtpoms of angina   She is able to perform over 4 METS without problem.   Clnically in SR   Will confirm EKG    Exam without evid of volume overload From a cardiac standpoint I think she is at a relatively low risk for a major cardiac event and OK to proceed with planned surgery without further testing   2.  HTN  BP is fair   138/   Follow for now     3  Lipids   Last LDL 67  Pt on lipitor      Will be available as needed    Current medicines are reviewed at length with the patient today.  The patient does not have concerns regarding medicines.  Signed, Dorris Carnes, MD  09/12/2021 3:38 PM    [address], [place], [place]  [id number] Phone: [phone]; Fax: [phone]

## 2021-09-12 NOTE — Patient Instructions (Signed)
Medication Instructions:  Your physician recommends that you continue on your current medications as directed. Please refer to the Current Medication list given to you today.  *If you need a refill on your cardiac medications before your next appointment, please call your pharmacy*   Lab Work: NONE   If you have labs (blood work) drawn today and your tests are completely normal, you will receive your results only by: [place] (if you have MyChart) OR A paper copy in the mail If you have any lab test that is abnormal or we need to change your treatment, we will call you to review the results.   Testing/Procedures: NONE    Follow-Up: At [facility], you and your health needs are our priority.  As part of our continuing mission to provide you with exceptional heart care, we have created designated Provider Care Teams.  These Care Teams include your primary Cardiologist (physician) and Advanced Practice Providers (APPs -  Physician Assistants and Nurse Practitioners) who all work together to provide you with the care you need, when you need it.  We recommend signing up for the patient portal called "MyChart".  Sign up information is provided on this After Visit Summary.  MyChart is used to connect with patients for Virtual Visits (Telemedicine).  Patients are able to view lab/test results, encounter notes, upcoming appointments, etc.  Non-urgent messages can be sent to your provider as well.   To learn more about what you can do with MyChart, go to [email or web address].    Your next appointment:    As Needed   The format for your next appointment:   In Person  Provider:   Dorris Carnes, MD   Other Instructions Thank you for choosing [facility]!

## 2021-09-19 ENCOUNTER — Other Ambulatory Visit: Payer: Self-pay

## 2021-09-19 ENCOUNTER — Ambulatory Visit (HOSPITAL_COMMUNITY)
Admission: RE | Admit: 2021-09-19 | Discharge: 2021-09-19 | Disposition: A | Discharge status: Home / Self Care | Payer: Commercial Managed Care - PPO | Source: Ambulatory Visit | Attending: Podiatry | Admitting: Podiatry

## 2021-09-19 DIAGNOSIS — M76822 Posterior tibial tendinitis, left leg: Secondary | ICD-10-CM | POA: Insufficient documentation

## 2021-09-24 ENCOUNTER — Other Ambulatory Visit: Payer: Self-pay

## 2021-09-24 ENCOUNTER — Ambulatory Visit (HOSPITAL_COMMUNITY)
Admission: RE | Admit: 2021-09-24 | Discharge: 2021-09-24 | Disposition: A | Discharge status: Home / Self Care | Payer: Commercial Managed Care - PPO | Source: Ambulatory Visit | Attending: General Surgery | Admitting: General Surgery

## 2021-09-30 ENCOUNTER — Encounter: Payer: Self-pay | Admitting: *Deleted

## 2021-10-02 ENCOUNTER — Other Ambulatory Visit: Payer: Self-pay

## 2021-10-02 ENCOUNTER — Encounter: Payer: Self-pay | Admitting: Urology

## 2021-10-02 ENCOUNTER — Ambulatory Visit (INDEPENDENT_AMBULATORY_CARE_PROVIDER_SITE_OTHER): Payer: Commercial Managed Care - PPO | Admitting: Urology

## 2021-10-02 VITALS — BP 159/51 | HR 78 | Temp 98.3°F | Ht 64.0 in | Wt 320.0 lb

## 2021-10-02 DIAGNOSIS — R8290 Unspecified abnormal findings in urine: Secondary | ICD-10-CM | POA: Diagnosis not present

## 2021-10-02 DIAGNOSIS — N200 Calculus of kidney: Secondary | ICD-10-CM

## 2021-10-02 DIAGNOSIS — Z6841 Body Mass Index (BMI) 40.0 and over, adult: Secondary | ICD-10-CM | POA: Diagnosis not present

## 2021-10-02 LAB — MICROSCOPIC EXAMINATION
Epithelial Cells (non renal): >10 /HPF — AB (ref 0–10)
RBC, Urine: >30 /HPF — AB (ref 0–2)
Renal Epithel, UA: NONE SEEN /HPF
WBC, UA: >30 /HPF — AB (ref 0–5)

## 2021-10-02 LAB — URINALYSIS, ROUTINE W REFLEX MICROSCOPIC
Bilirubin, UA: NEGATIVE
Glucose, UA: NEGATIVE
Ketones, UA: NEGATIVE
Nitrite, UA: NEGATIVE
Specific Gravity, UA: 1.02 (ref 1.005–1.030)
Urobilinogen, Ur: 0.2 mg/dL (ref 0.2–1.0)
pH, UA: 6 (ref 5.0–7.5)

## 2021-10-02 NOTE — Progress Notes (Signed)
Assessment: 1. Staghorn renal calculus   2. Morbid obesity with body mass index (BMI) of 50.0 to 59.9 in adult ([place])   3. Abnormal urine findings      Plan: I personally reviewed the upper GI study from 09/24/2021 showing a large right staghorn calculus.  Results discussed with the patient in detail today.  Management options for a staghorn calculus discussed with the patient including surgical management. Urine culture sent today - will call with results BMP today Schedule for CT abdomen and pelvis with and without IV contrast Return to office after CT done to discuss results and management options  Chief Complaint:  Chief Complaint  Patient presents with   Nephrolithiasis     History of Present Illness:  [name] is a 60 y.o. year old female who is seen in consultation from Cory Munch, [place] for evaluation of right staghorn calculus.  She recently underwent a upper GI on 09/24/2021 for evaluation of possible bariatric surgery.  The study showed a large right staghorn calculus appearing to occupy all of the right renal calyces and renal pelvis.  No prior imaging studies available.  She does report some low back pain with radiation to the left.  She also has left sciatica.  She does have a history of prior lumbar disc surgery.  She is not having any right-sided flank pain.  No recent UTIs.  She does report occasional gross hematuria occurring 3-4 times within the past year.  No dysuria.  No prior history of kidney stones.   Past Medical History:  Past Medical History:  Diagnosis Date   Bronchitis    Hypertension    Obesity    Sickle cell trait ([place])     Past Surgical History:  Past Surgical History:  Procedure Laterality Date   ABDOMINAL HYSTERECTOMY N/A 07/10/2014   Procedure: HYSTERECTOMY ABDOMINAL;  Surgeon: Jonnie Kind, MD;  Location: [facility];  Service: Gynecology;  Laterality: N/A;   BACK SURGERY     CATARACT EXTRACTION W/PHACO Left 10/10/2018    Procedure: CATARACT EXTRACTION PHACO AND INTRAOCULAR LENS PLACEMENT ([place]);  Surgeon: Tonny Branch, MD;  Location: [facility];  Service: Ophthalmology;  Laterality: Left;  CDE: 2.63   CATARACT EXTRACTION W/PHACO Right 10/27/2018   Procedure: CATARACT EXTRACTION PHACO AND INTRAOCULAR LENS PLACEMENT RIGHT EYE ;  Surgeon: Tonny Branch, MD;  Location: [facility] ORS;  Service: Ophthalmology;  Laterality: Right;  right   COLONOSCOPY N/A 07/21/2013   Procedure: COLONOSCOPY;  Surgeon: Danie Binder, MD;  Location: [facility] END[facility] SU[facility];  Service: Endoscopy;  Laterality: N/A;  10:15 AM   CYST EXCISION Right    knee   SALPINGOOPHORECTOMY Bilateral 07/10/2014   Procedure: SALPINGO OOPHORECTOMY;  Surgeon: Jonnie Kind, MD;  Location: [facility] ORS;  Service: Gynecology;  Laterality: Bilateral;   TUBAL LIGATION     WRIST SURGERY      Allergies:  Allergies  Allergen Reactions   Oxycodone Shortness Of Breath    Family History:  Family History  Problem Relation Age of Onset   Hypertension Mother    Sickle cell trait Mother    Heart attack Father    Sickle cell anemia Sister    Sickle cell trait Sister     Social History:  Social History   Tobacco Use   Smoking status: Never   Smokeless tobacco: Never  Vaping Use   Vaping Use: Never used  Substance Use Topics   Alcohol use: No   Drug use: No  Review of symptoms:  Constitutional:  Negative for unexplained weight loss, night sweats, fever, chills ENT:  Negative for nose bleeds, sinus pain, painful swallowing CV:  Negative for chest pain, shortness of breath, exercise intolerance, palpitations, loss of consciousness Resp:  Negative for cough, wheezing, shortness of breath GI:  Negative for nausea, vomiting, diarrhea, bloody stools GU:  Positives noted in HPI; otherwise negative for dysuria, urinary incontinence Neuro:  Negative for seizures, poor balance, limb weakness, slurred speech Psych:  Negative for lack of energy, depression, anxiety Endocrine:   Negative for polydipsia, polyuria, symptoms of hypoglycemia (dizziness, hunger, sweating) Hematologic:  Negative for anemia, purpura, petechia, prolonged or excessive bleeding, use of anticoagulants  Allergic:  Negative for difficulty breathing or choking as a result of exposure to anything; no shellfish allergy; no allergic response (rash/itch) to materials, foods  Physical exam: BP (!) 159/51   Pulse 78   Temp 98.3 F (36.8 C)   Ht 5\' 4"  (1.626 m)   Wt (!) 320 lb (145.2 kg)   LMP 06/19/2014   BMI 54.93 kg/m  GENERAL APPEARANCE:  Well appearing, well developed, well nourished, NAD HEENT: Atraumatic, Normocephalic, oropharynx clear. NECK: Supple without lymphadenopathy or thyromegaly. LUNGS: Clear to auscultation bilaterally. HEART: Regular Rate and Rhythm without murmurs, gallops, or rubs. ABDOMEN: Soft, non-tender, No Masses. EXTREMITIES: Moves all extremities well.  Without clubbing, cyanosis, or edema. NEUROLOGIC:  Alert and oriented x 3, normal gait, CN II-XII grossly intact.  MENTAL STATUS:  Appropriate. BACK:  Non-tender to palpation.  No CVAT SKIN:  Warm, dry and intact.    Results: U/A:  > 30 RBC, >30 WBC, >10 epithelial cells, many bacteria

## 2021-10-02 NOTE — Progress Notes (Signed)
Urological Symptom Review  Patient is experiencing the following symptoms: Get up at night to urinate Leakage of urine   Review of Systems  Gastrointestinal (upper)  : Negative for upper GI symptoms  Gastrointestinal (lower) : Negative for lower GI symptoms  Constitutional : Night Sweats  Skin: Negative for skin symptoms  Eyes: Negative for eye symptoms  Ear/Nose/Throat : Sinus problems  Hematologic/Lymphatic: Negative for Hematologic/Lymphatic symptoms  Cardiovascular : Negative for cardiovascular symptoms  Respiratory : Negative for respiratory symptoms  Endocrine: Negative for endocrine symptoms  Musculoskeletal: Back pain  Neurological: Negative for neurological symptoms  Psychologic: Negative for psychiatric symptoms

## 2021-10-03 LAB — BASIC METABOLIC PANEL
BUN/Creatinine Ratio: 14 (ref 9–23)
BUN: 13 mg/dL (ref 6–24)
CO2: 23 mmol/L (ref 20–29)
Calcium: 10.6 mg/dL — ABNORMAL HIGH (ref 8.7–10.2)
Chloride: 104 mmol/L (ref 96–106)
Creatinine, Ser: 0.94 mg/dL (ref 0.57–1.00)
Glucose: 79 mg/dL (ref 70–99)
Potassium: 4.1 mmol/L (ref 3.5–5.2)
Sodium: 143 mmol/L (ref 134–144)
eGFR: 70 mL/min/{1.73_m2} (ref >59)

## 2021-10-10 ENCOUNTER — Telehealth: Payer: Self-pay

## 2021-10-10 MED ORDER — CIPROFLOXACIN HCL 500 MG PO TABS: 500.0000 mg | ORAL_TABLET | Freq: Two times a day (BID) | ORAL | 0 refills | Status: AC

## 2021-10-10 NOTE — Telephone Encounter (Signed)
Patient returned call and made aware.

## 2021-10-10 NOTE — Telephone Encounter (Signed)
-----   Message from Primus Bravo, MD sent at 10/10/2021  1:53 PM EDT ----- Please notify patient that her urine culture did show evidence of a UTI.  I have sent in a rx for Cipro for treatment.

## 2021-10-10 NOTE — Addendum Note (Signed)
Addended by: Primus Bravo on: 10/10/2021 01:54 PM   Modules accepted: Orders

## 2021-10-11 LAB — URINE CULTURE

## 2021-10-28 ENCOUNTER — Other Ambulatory Visit: Payer: Self-pay

## 2021-10-28 ENCOUNTER — Encounter: Payer: Self-pay | Admitting: Skilled Nursing Facility1

## 2021-10-28 ENCOUNTER — Encounter: Payer: Commercial Managed Care - PPO | Attending: General Surgery | Admitting: Skilled Nursing Facility1

## 2021-10-28 DIAGNOSIS — E669 Obesity, unspecified: Secondary | ICD-10-CM | POA: Insufficient documentation

## 2021-10-28 NOTE — Progress Notes (Signed)
Nutrition Assessment for Bariatric Surgery Medical Nutrition Therapy Appt Start Time: 8:46    End Time: 10:00  Patient was seen on 10/28/2021 for Pre-Operative Nutrition Assessment. Letter of approval faxed to [facility] bariatric surgery program coordinator on 10/28/2021.   Referral stated Supervised Weight Loss (SWL) visits needed: 0  Pt completed visits.   Pt has cleared nutrition requirements.    Planned surgery: sleeve gastrectomy  Pt expectation of surgery: to lose weight Pt expectation of dietitian: none identified     NUTRITION ASSESSMENT   Anthropometrics  Start weight at [facility]: 325.3 lbs (date: 10/28/2021)  Height: 64 in BMI: 55.84 kg/m2     Clinical  Medical hx: kidney stone, HTN, sciatica  Medications: see list; vitamin D, omega 3 Labs: Calcium 10.6 (took a pill with calcium in it before lab), vitamin D 24.2 Notable signs/symptoms: pain in back and foot Any previous deficiencies? Yes: vitamin D  Micronutrient Nutrition Focused Physical Exam: Hair: No issues observed Eyes: No issues observed Mouth: No issues observed Neck: No issues observed Nails: No issues observed Skin: No issues observed  Lifestyle & Dietary Hx  Pt states she has realized she is a stress eater and eats on the go too often, realizing she has to let some things go like stressing with her moms problems and learning to say no.   Pt states her main motivator is to not have as much physical pain.   24-Hr Dietary Recall First Meal: skipped Snack:  Second Meal: spaghetti and salad  Snack: chips  Third Meal: fried chicken Snack: grapes  Beverages: water, soda, sweet tea   Estimated Energy Needs Calories: 1500   NUTRITION DIAGNOSIS  Overweight/obesity ([place]-3.3) related to past poor dietary habits and physical inactivity as evidenced by patient w/ planned sleeve gastrectomy surgery following dietary guidelines for continued weight loss.    NUTRITION INTERVENTION   Nutrition counseling (C-1) and education (E-2) to facilitate bariatric surgery goals.  Educated pt on micronutrient deficiencies post surgery and strategies to mitigate that risk Educated pt on Mindful Informed decisions with foods   Pre-Op Goals Reviewed with the Patient Track food and beverage intake (pen and paper, MyFitness Pal, Baritastic app, etc.) Make healthy food choices while monitoring portion sizes Consume 3 meals per day or try to eat every 3-5 hours Avoid concentrated sugars and fried foods Keep sugar & fat in the single digits per serving on food labels Practice CHEWING your food (aim for applesauce consistency) Practice not drinking 15 minutes before, during, and 30 minutes after each meal and snack Avoid all carbonated beverages (ex: soda, sparkling beverages)  Limit caffeinated beverages (ex: coffee, tea, energy drinks) Avoid all sugar-sweetened beverages (ex: regular soda, sports drinks)  Avoid alcohol  Aim for 64-100 ounces of FLUID daily (with at least half of fluid intake being plain water)  Aim for at least 60-80 grams of PROTEIN daily Look for a liquid protein source that contains ?15 g protein and ?5 g carbohydrate (ex: shakes, drinks, shots) Make a list of non-food related activities Physical activity is an important part of a healthy lifestyle so keep it moving! The goal is to reach 150 minutes of exercise per week, including cardiovascular and weight baring activity: try chair exercises   *Goals that are bolded indicate the pt would like to start working towards these  Handouts Provided Include  Bariatric Surgery handouts (Nutrition Visits, Pre-Op Goals, Protein Shakes, Vitamins & Minerals)  Learning Style & Readiness for Change Teaching method utilized: Visual &  Auditory  Demonstrated degree of understanding via: Teach Back  Readiness Level: Action Barriers to learning/adherence to lifestyle change: none identified     MONITORING & EVALUATION Dietary  intake, weekly physical activity, body weight, and pre-op goals reached at next nutrition visit.    Next Steps  Patient is to follow up at [place] for Pre-Op Class >2 weeks before surgery for further nutrition education.  Pt has completed visits. No further supervised visits required/recomended

## 2021-11-07 ENCOUNTER — Other Ambulatory Visit: Payer: Self-pay

## 2021-11-07 ENCOUNTER — Telehealth: Payer: Self-pay

## 2021-11-07 ENCOUNTER — Ambulatory Visit (HOSPITAL_COMMUNITY)
Admission: RE | Admit: 2021-11-07 | Discharge: 2021-11-07 | Disposition: A | Discharge status: Home / Self Care | Payer: Commercial Managed Care - PPO | Source: Ambulatory Visit | Attending: Urology | Admitting: Urology

## 2021-11-07 DIAGNOSIS — N200 Calculus of kidney: Secondary | ICD-10-CM | POA: Insufficient documentation

## 2021-11-07 MED ORDER — IOHEXOL 300 MG/ML  SOLN
100.0000 mL | Freq: Once | INTRAMUSCULAR | Status: AC | PRN
  Administered 2021-11-07: 100 mL via INTRAVENOUS

## 2021-11-07 NOTE — Telephone Encounter (Signed)
[facility] radiology called a stat report of patient CT today.  Message sent to MD

## 2021-11-10 ENCOUNTER — Other Ambulatory Visit: Payer: Self-pay

## 2021-11-10 ENCOUNTER — Encounter: Payer: Commercial Managed Care - PPO | Admitting: Skilled Nursing Facility1

## 2021-11-10 DIAGNOSIS — E669 Obesity, unspecified: Secondary | ICD-10-CM

## 2021-11-10 LAB — POCT I-STAT CREATININE: Creatinine, Ser: 1.1 mg/dL — ABNORMAL HIGH (ref 0.44–1.00)

## 2021-11-10 NOTE — Progress Notes (Signed)
Pre-Operative Nutrition Class:    Patient was seen on 11/10/2021 for Pre-Operative Bariatric Surgery Education at [facility].    Surgery date:  Surgery type: sleeve Start weight at [facility]: 325.3 Weight today: 327.8  Samples given per MNT protocol. Patient educated on appropriate usage: Ensure max exp: February 25, 2022 Ensure max lot: [id number]  Chewable bariatric advantage: advanced multi EA exp: 08/23 Chewable bariatric advantage: advanced multi EA lot: [id number]  Bariatric advantage calcium citrate exp: 02/23 Bariatric advantage calcium citrate lot: [id number]   The following the learning objectives were met by the patient during this course: Identify Pre-Op Dietary Goals and will begin 2 weeks pre-operatively Identify appropriate sources of fluids and proteins  State protein recommendations and appropriate sources pre and post-operatively Identify Post-Operative Dietary Goals and will follow for 2 weeks post-operatively Identify appropriate multivitamin and calcium sources Describe the need for physical activity post-operatively and will follow MD recommendations State when to call healthcare provider regarding medication questions or post-operative complications When having a diagnosis of diabetes understanding hypoglycemia symptoms and the inclusion of 1 complex carbohydrate per meal  Handouts given during class include: Pre-Op Bariatric Surgery Diet Handout Protein Shake Handout Post-Op Bariatric Surgery Nutrition Handout BELT Program Information Flyer Support Group Information Flyer [facility] Bariatric Supplements Price List  Follow-Up Plan: Patient will follow-up at [facility] 2 weeks post operatively for diet advancement per MD.

## 2021-11-18 ENCOUNTER — Other Ambulatory Visit: Payer: Self-pay

## 2021-11-18 DIAGNOSIS — N200 Calculus of kidney: Secondary | ICD-10-CM

## 2021-12-15 IMAGING — DX DG FOOT COMPLETE 3+V*L*
3 series · 3 of 3 positions shown · non-contrast
Comparison: None.

CLINICAL DATA: Ankle and foot pain, no known injury

EXAM:
LEFT FOOT - COMPLETE 3+ VIEW

[foot ap]
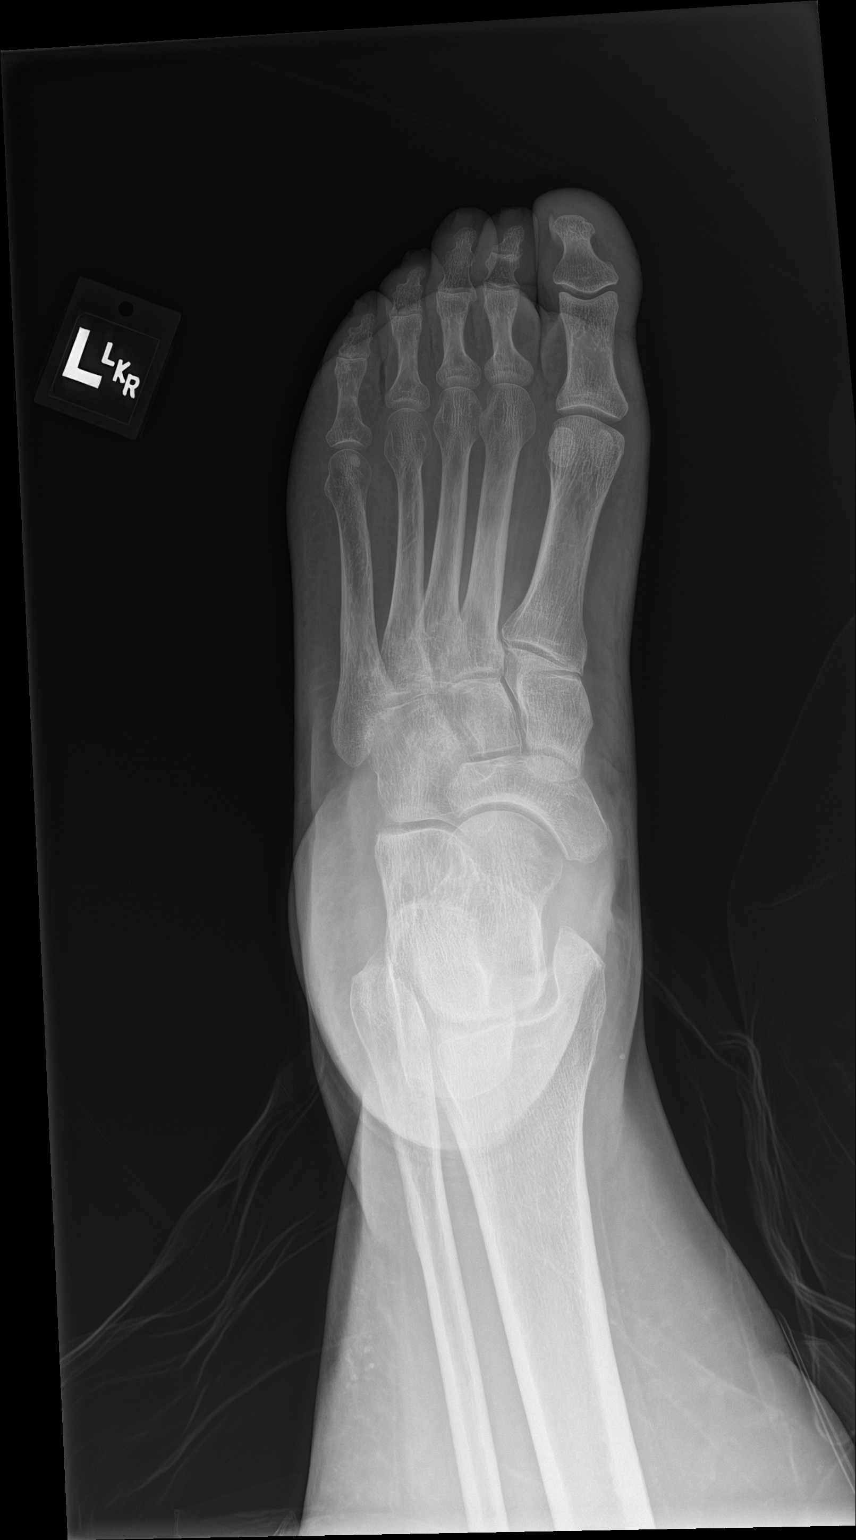

[foot obl]
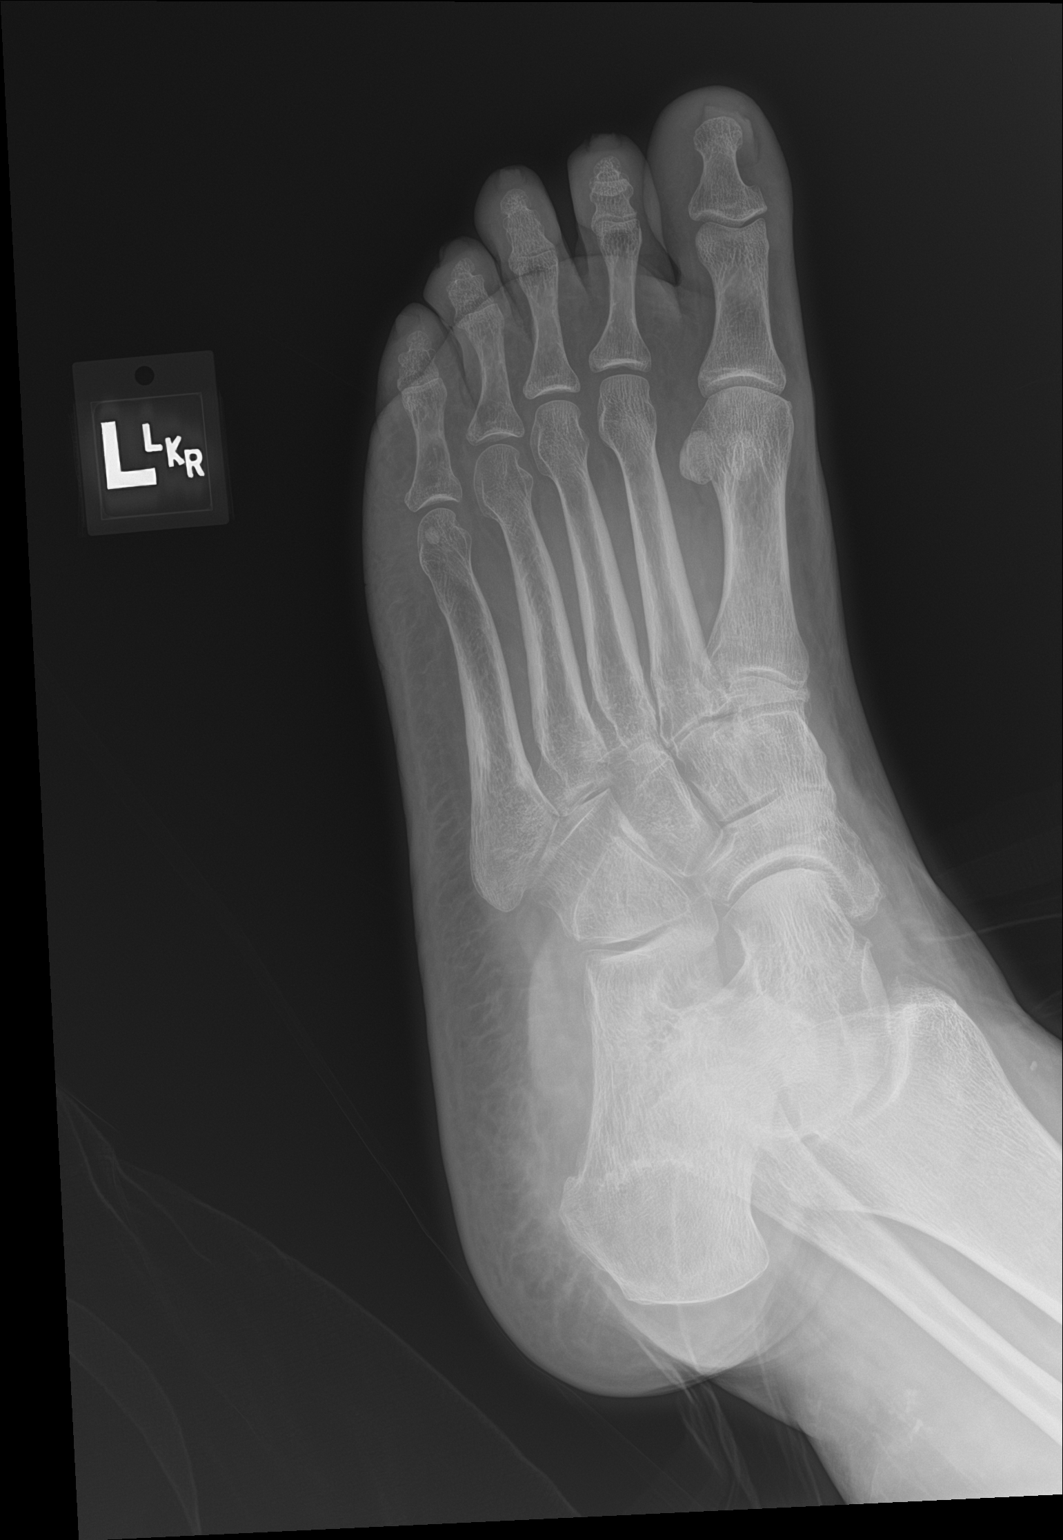

[foot lat]
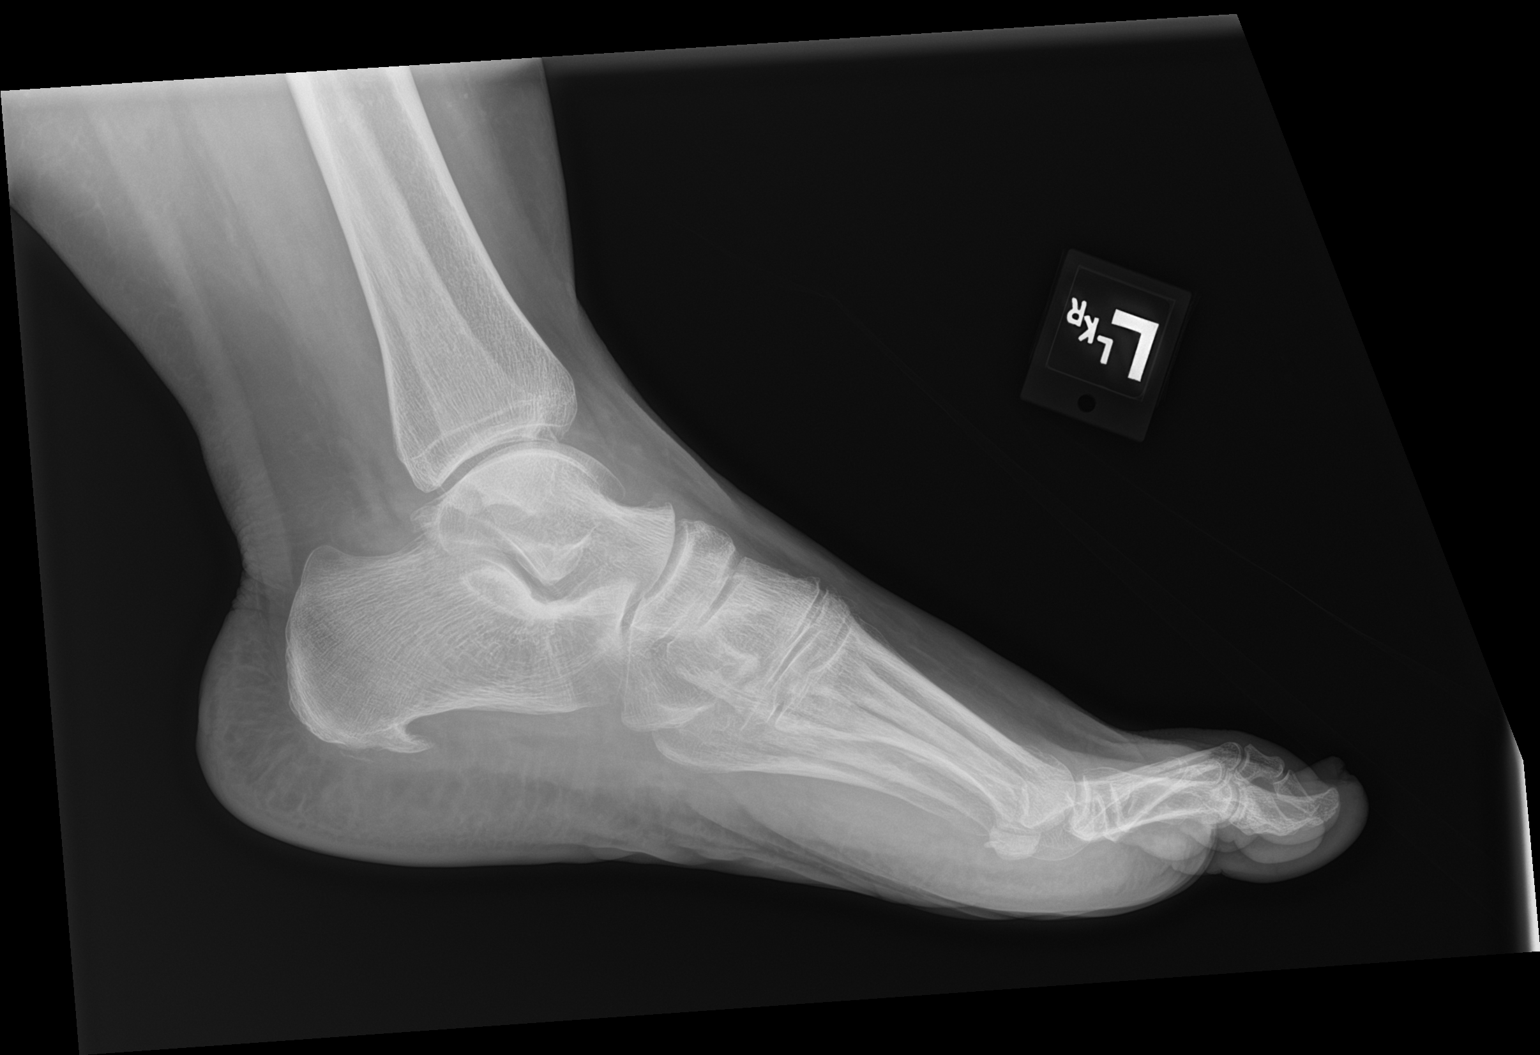

[3 of 3 positions shown; findings below may reference images not displayed]

FINDINGS: Plantar calcaneal spur. No acute bony abnormality. Specifically, no
fracture, subluxation, or dislocation.
IMPRESSION: No acute bony abnormality.

## 2021-12-16 ENCOUNTER — Other Ambulatory Visit (HOSPITAL_COMMUNITY): Payer: Self-pay | Admitting: Urology

## 2021-12-16 ENCOUNTER — Other Ambulatory Visit: Payer: Self-pay | Admitting: Urology

## 2021-12-16 DIAGNOSIS — N200 Calculus of kidney: Secondary | ICD-10-CM

## 2021-12-30 ENCOUNTER — Encounter (HOSPITAL_COMMUNITY): Payer: Self-pay

## 2021-12-30 ENCOUNTER — Other Ambulatory Visit: Payer: Self-pay

## 2021-12-30 ENCOUNTER — Encounter (HOSPITAL_COMMUNITY)
Admission: RE | Admit: 2021-12-30 | Discharge: 2021-12-30 | Disposition: A | Discharge status: Home / Self Care | Payer: Commercial Managed Care - PPO | Source: Ambulatory Visit | Attending: Urology | Admitting: Urology

## 2021-12-30 DIAGNOSIS — N200 Calculus of kidney: Secondary | ICD-10-CM | POA: Diagnosis not present

## 2021-12-30 MED ORDER — FUROSEMIDE 10 MG/ML IJ SOLN: 60.0000 mg | Freq: Once | INTRAMUSCULAR | Status: AC

## 2021-12-30 MED ORDER — FUROSEMIDE 10 MG/ML IJ SOLN
INTRAMUSCULAR | Status: AC
  Administered 2021-12-30: 60 mg via INTRAVENOUS
  Filled 2021-12-30: qty 6

## 2021-12-30 MED ORDER — TECHNETIUM TC 99M MERTIATIDE
5.0000 | Freq: Once | INTRAVENOUS | Status: AC | PRN
  Administered 2021-12-30: 5 via INTRAVENOUS

## 2022-01-21 ENCOUNTER — Other Ambulatory Visit: Payer: Self-pay | Admitting: Urology

## 2022-02-06 NOTE — Progress Notes (Addendum)
°     Anesthesia Review:  PCP: DR Collene Mares  at  [facility]  Cardiologist : none  Chest x-ray :09/20/21  EKG :09/24/21  Echo : Stress test: Cardiac Cath :  Activity level: can do a flight of stairs without difficulty  Sleep Study/ CPAP : none  Fasting Blood Sugar :      / Checks Blood Sugar -- times a day:   Blood Thinner/ Instructions /Last Dose: ASA / Instructions/ Last Dose : 325 mg aspirin   -pt has not yet received instructions.  Pt instructed at preop to call office of Dr Tresa Moore and speak with either Triage or Scheduler to receive preop instrucitions.  PT voiced understanding.  Phone number given to pt.  Cvoid test on2/20;23 at 0845am  2nd Stage nephro on 02/20/22

## 2022-02-06 NOTE — Progress Notes (Signed)
Coivd test on 02/16/22.   Come thru main entrance at [facility] long.  Have a seat in the lobby on the right as you come thru the door.  Call [phone] and let them know you are here for covid testing.                 [name]  02/06/2022   Your procedure is scheduled on:   02/18/22.    Report to [facility] Main  Entrance   Report to admitting at   0615am      Call this number if you have problems the morning of surgery [phone]    Remember: Do not eat food , candy gum or mints :After Midnight. You may have clear liquids from midnight until __   0530am    CLEAR LIQUID DIET   Foods Allowed                                                                       Coffee and tea, regular and decaf                              Plain Jell-O any favor except red or purple                                            Fruit ices (not with fruit pulp)                                      Iced Popsicles                                     Carbonated beverages, regular and diet                                    Cranberry, grape and apple juices Sports drinks like Gatorade Lightly seasoned clear broth or consume(fat free) Sugar   _____________________________________________________________________    BRUSH YOUR TEETH MORNING OF SURGERY AND RINSE YOUR MOUTH OUT, NO CHEWING GUM CANDY OR MINTS.     Take these medicines the morning of surgery with A SIP OF WATER:  gabapentin   DO NOT TAKE ANY DIABETIC MEDICATIONS DAY OF YOUR SURGERY                               You may not have any metal on your body including hair pins and              piercings  Do not wear jewelry, make-up, lotions, powders or perfumes, deodorant             Do not wear nail polish on your fingernails.  Do not shave  48 hours prior to surgery.  Men may shave face and neck.   Do not bring valuables to the hospital. [place].  Contacts,  dentures or bridgework may not be worn into surgery.  Leave suitcase in the car. After surgery it may be brought to your room.     Patients discharged the day of surgery will not be allowed to drive home. IF YOU ARE HAVING SURGERY AND GOING HOME THE SAME DAY, YOU MUST HAVE AN ADULT TO DRIVE YOU HOME AND BE WITH YOU FOR 24 HOURS. YOU MAY GO HOME BY TAXI OR UBER OR ORTHERWISE, BUT AN ADULT MUST ACCOMPANY YOU HOME AND STAY WITH YOU FOR 24 HOURS.  Name and phone number of your driver:  Special Instructions: N/A              Please read over the following fact sheets you were given: _____________________________________________________________________  [facility] - Preparing for Surgery Before surgery, you can play an important role.  Because skin is not sterile, your skin needs to be as free of germs as possible.  You can reduce the number of germs on your skin by washing with CHG (chlorahexidine gluconate) soap before surgery.  CHG is an antiseptic cleaner which kills germs and bonds with the skin to continue killing germs even after washing. Please DO NOT use if you have an allergy to CHG or antibacterial soaps.  If your skin becomes reddened/irritated stop using the CHG and inform your nurse when you arrive at Short Stay. Do not shave (including legs and underarms) for at least 48 hours prior to the first CHG shower.  You may shave your face/neck. Please follow these instructions carefully:  1.  Shower with CHG Soap the night before surgery and the  morning of Surgery.  2.  If you choose to wash your hair, wash your hair first as usual with your  normal  shampoo.  3.  After you shampoo, rinse your hair and body thoroughly to remove the  shampoo.                           4.  Use CHG as you would any other liquid soap.  You can apply chg directly  to the skin and wash                       Gently with a scrungie or clean washcloth.  5.  Apply the CHG Soap to your body ONLY FROM THE NECK DOWN.   Do  not use on face/ open                           Wound or open sores. Avoid contact with eyes, ears mouth and genitals (private parts).                       Wash face,  Genitals (private parts) with your normal soap.             6.  Wash thoroughly, paying special attention to the area where your surgery  will be performed.  7.  Thoroughly rinse your body with warm water from the neck down.  8.  DO NOT shower/wash with your normal soap after using and rinsing off  the CHG Soap.  9.  Pat yourself dry with a clean towel.            10.  Wear clean pajamas.            11.  Place clean sheets on your bed the night of your first shower and do not  sleep with pets. Day of Surgery : Do not apply any lotions/deodorants the morning of surgery.  Please wear clean clothes to the hospital/surgery center.  FAILURE TO FOLLOW THESE INSTRUCTIONS MAY RESULT IN THE CANCELLATION OF YOUR SURGERY PATIENT SIGNATURE_________________________________  NURSE SIGNATURE__________________________________  ________________________________________________________________________

## 2022-02-10 ENCOUNTER — Other Ambulatory Visit: Payer: Self-pay

## 2022-02-10 ENCOUNTER — Encounter (HOSPITAL_COMMUNITY)
Admission: RE | Admit: 2022-02-10 | Discharge: 2022-02-10 | Disposition: A | Discharge status: Home / Self Care | Payer: Commercial Managed Care - PPO | Source: Ambulatory Visit | Attending: Urology | Admitting: Urology

## 2022-02-10 ENCOUNTER — Encounter (HOSPITAL_COMMUNITY): Payer: Self-pay

## 2022-02-10 VITALS — BP 123/88 | HR 59 | Temp 98.5°F | Resp 16 | Ht 64.0 in | Wt 314.0 lb

## 2022-02-10 DIAGNOSIS — Z01812 Encounter for preprocedural laboratory examination: Secondary | ICD-10-CM | POA: Diagnosis not present

## 2022-02-10 DIAGNOSIS — Z6841 Body Mass Index (BMI) 40.0 and over, adult: Secondary | ICD-10-CM | POA: Insufficient documentation

## 2022-02-10 DIAGNOSIS — N200 Calculus of kidney: Secondary | ICD-10-CM | POA: Insufficient documentation

## 2022-02-10 DIAGNOSIS — Z87442 Personal history of urinary calculi: Secondary | ICD-10-CM | POA: Insufficient documentation

## 2022-02-10 DIAGNOSIS — E669 Obesity, unspecified: Secondary | ICD-10-CM | POA: Insufficient documentation

## 2022-02-10 DIAGNOSIS — I10 Essential (primary) hypertension: Secondary | ICD-10-CM | POA: Diagnosis not present

## 2022-02-10 DIAGNOSIS — Z01818 Encounter for other preprocedural examination: Secondary | ICD-10-CM

## 2022-02-10 HISTORY — DX: Personal history of urinary calculi: Z87.442

## 2022-02-10 HISTORY — DX: Unspecified osteoarthritis, unspecified site: M19.90

## 2022-02-10 LAB — CBC
HCT: 42.3 % (ref 36.0–46.0)
Hemoglobin: 13.4 g/dL (ref 12.0–15.0)
MCH: 26.1 pg (ref 26.0–34.0)
MCHC: 31.7 g/dL (ref 30.0–36.0)
MCV: 82.3 fL (ref 80.0–100.0)
Platelets: 257 10*3/uL (ref 150–400)
RBC: 5.14 MIL/uL — ABNORMAL HIGH (ref 3.87–5.11)
RDW: 13.8 % (ref 11.5–15.5)
WBC: 9.5 10*3/uL (ref 4.0–10.5)
nRBC: 0 % (ref 0.0–0.2)

## 2022-02-10 LAB — BASIC METABOLIC PANEL
Anion gap: 7 (ref 5–15)
BUN: 15 mg/dL (ref 6–20)
CO2: 24 mmol/L (ref 22–32)
Calcium: 10 mg/dL (ref 8.9–10.3)
Chloride: 106 mmol/L (ref 98–111)
Creatinine, Ser: 0.97 mg/dL (ref 0.44–1.00)
GFR, Estimated: >60 mL/min (ref >60)
Glucose, Bld: 94 mg/dL (ref 70–99)
Potassium: 3.7 mmol/L (ref 3.5–5.1)
Sodium: 137 mmol/L (ref 135–145)

## 2022-02-10 NOTE — Progress Notes (Signed)
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 never smoker with h/o HTN, right complete staghorn kidney stone scheduled for above procedure 02/18/22 with Dr. Alexis Frock.   Pt last seen by cardiology 09/12/2021. Per OV note, "Preop risk assessment   Pt being evaluated for gastric bypass.   No symtpoms of angina   She is able to perform over 4 METS without problem.   Clnically in SR   Will confirm EKG    Exam without evid of volume overload From a cardiac standpoint I think she is at a relatively low risk for a major cardiac event and OK to proceed with planned surgery without further testing "  Anticipate pt can proceed with planned procedure barring acute status change.   VS: BP 123/88    Pulse (!) 59    Temp 36.9 C (Oral)    Resp 16    Ht 5\' 4"  (1.626 m)    Wt (!) 142.4 kg    LMP 06/19/2014    SpO2 100%    BMI 53.90 kg/m   PROVIDERS: Cory Munch, PA-C is PCP   Dorris Carnes, MD is Cardiologist  LABS: Labs reviewed: Acceptable for surgery. (all labs ordered are listed, but only abnormal results are displayed)  Labs Reviewed  CBC - Abnormal; Notable for the following components:      Result Value   RBC 5.14 (*)    All other components within normal limits  BASIC METABOLIC PANEL     IMAGES:   EKG: 09/24/2021 Rate 79 bpm  NSR  CV:  Past Medical History:  Diagnosis Date   Arthritis    Bronchitis    History of kidney stones    Hypertension    Obesity    Sickle cell trait ([place])     Past Surgical History:  Procedure Laterality Date   ABDOMINAL HYSTERECTOMY  N/A 07/10/2014   Procedure: HYSTERECTOMY ABDOMINAL;  Surgeon: Jonnie Kind, MD;  Location: [facility];  Service: Gynecology;  Laterality: N/A;   BACK SURGERY     CATARACT EXTRACTION W/PHACO Left 10/10/2018   Procedure: CATARACT EXTRACTION PHACO AND INTRAOCULAR LENS PLACEMENT ([place]);  Surgeon: Tonny Branch, MD;  Location: [facility];  Service: Ophthalmology;  Laterality: Left;  CDE: 2.63   CATARACT EXTRACTION W/PHACO Right 10/27/2018   Procedure: CATARACT EXTRACTION PHACO AND INTRAOCULAR LENS PLACEMENT RIGHT EYE ;  Surgeon: Tonny Branch, MD;  Location: [facility];  Service: Ophthalmology;  Laterality: Right;  right   COLONOSCOPY N/A 07/21/2013   Procedure: COLONOSCOPY;  Surgeon: Danie Binder, MD;  Location: [facility];  Service: Endoscopy;  Laterality: N/A;  10:15 AM   CYST EXCISION Right    knee   SALPINGOOPHORECTOMY Bilateral 07/10/2014   Procedure: SALPINGO OOPHORECTOMY;  Surgeon: Jonnie Kind, MD;  Location: [facility];  Service: Gynecology;  Laterality: Bilateral;   TUBAL LIGATION     WRIST SURGERY      MEDICATIONS:  aspirin 325 MG tablet   Aspirin-Salicylamide-Caffeine (BC HEADACHE PO)   atorvastatin (LIPITOR) 20 MG tablet   cholecalciferol (  VITAMIN D) 1000 UNITS tablet   diphenhydramine-acetaminophen (TYLENOL PM) 25-500 MG TABS tablet   gabapentin (NEURONTIN) 300 MG capsule   lisinopril-hydrochlorothiazide (PRINZIDE,ZESTORETIC) 20-12.5 MG per tablet   Menthol, Topical Analgesic, (ICY HOT EX)   Omega-3 Fatty Acids (FISH OIL) 1200 MG CAPS   oxymetazoline (AFRIN) 0.05 % nasal spray    diclofenac Sodium (VOLTAREN) 1 % topical gel 2 g   [facility], PA-C [facility] Pre-Surgical Testing [phone]

## 2022-02-16 ENCOUNTER — Encounter (HOSPITAL_COMMUNITY)
Admission: RE | Admit: 2022-02-16 | Discharge: 2022-02-16 | Disposition: A | Discharge status: Home / Self Care | Payer: Commercial Managed Care - PPO | Source: Ambulatory Visit | Attending: Urology | Admitting: Urology

## 2022-02-16 ENCOUNTER — Other Ambulatory Visit: Payer: Self-pay

## 2022-02-16 DIAGNOSIS — Z20822 Contact with and (suspected) exposure to covid-19: Secondary | ICD-10-CM | POA: Insufficient documentation

## 2022-02-16 DIAGNOSIS — Z01818 Encounter for other preprocedural examination: Secondary | ICD-10-CM | POA: Insufficient documentation

## 2022-02-16 DIAGNOSIS — Z01812 Encounter for preprocedural laboratory examination: Secondary | ICD-10-CM

## 2022-02-16 LAB — SARS CORONAVIRUS 2 (TAT 6-24 HRS): SARS Coronavirus 2: NEGATIVE

## 2022-02-17 NOTE — Anesthesia Preprocedure Evaluation (Addendum)
Anesthesia Evaluation  Patient identified by MRN, date of birth, ID band Patient awake    Reviewed: Allergy & Precautions, NPO status , Patient's Chart, lab work & pertinent test results  Airway Mallampati: III  TM Distance: >3 FB Neck ROM: Full    Dental no notable dental hx.    Pulmonary neg pulmonary ROS,    Pulmonary exam normal        Cardiovascular hypertension, Pt. on medications  Rhythm:Regular Rate:Normal     Neuro/Psych negative neurological ROS  negative psych ROS   GI/Hepatic negative GI ROS, Neg liver ROS,   Endo/Other  Morbid obesity  Renal/GU Right staghorn calculi  negative genitourinary   Musculoskeletal  (+) Arthritis , Osteoarthritis,    Abdominal (+) + obese,   Peds  Hematology negative hematology ROS (+)   Anesthesia Other Findings   Reproductive/Obstetrics                            Anesthesia Physical Anesthesia Plan  ASA: 3  Anesthesia Plan: General   Post-op Pain Management: Tylenol PO (pre-op)*   Induction: Intravenous  PONV Risk Score and Plan: 3 and Ondansetron, Dexamethasone, Midazolam and Treatment may vary due to age or medical condition  Airway Management Planned: Mask and Oral ETT  Additional Equipment: None  Intra-op Plan:   Post-operative Plan: Extubation in OR  Informed Consent: I have reviewed the patients History and Physical, chart, labs and discussed the procedure including the risks, benefits and alternatives for the proposed anesthesia with the patient or authorized representative who has indicated his/her understanding and acceptance.     Dental advisory given  Plan Discussed with: CRNA  Anesthesia Plan Comments: (Lab Results      Component                Value               Date                      WBC                      9.5                 02/10/2022                HGB                      13.4                02/10/2022                 HCT                      42.3                02/10/2022                MCV                      82.3                02/10/2022                PLT                      257  02/10/2022           Lab Results      Component                Value               Date                      NA                       137                 02/10/2022                K                        3.7                 02/10/2022                CO2                      24                  02/10/2022                GLUCOSE                  94                  02/10/2022                BUN                      15                  02/10/2022                CREATININE               0.97                02/10/2022                CALCIUM                  10.0                02/10/2022                EGFR                     70                  10/02/2021                GFRNONAA                 >60                 02/10/2022          )       Anesthesia Quick Evaluation

## 2022-02-18 ENCOUNTER — Inpatient Hospital Stay (HOSPITAL_COMMUNITY)
Admission: RE | Admit: 2022-02-18 | Discharge: 2022-02-24 | DRG: 660 | Disposition: A | Discharge status: Home / Self Care | Payer: Commercial Managed Care - PPO | Attending: Urology | Admitting: Urology

## 2022-02-18 ENCOUNTER — Encounter (HOSPITAL_COMMUNITY)
Admission: RE | Disposition: A | Discharge status: Home / Self Care | Payer: Self-pay | Source: Home / Self Care | Attending: Urology

## 2022-02-18 ENCOUNTER — Inpatient Hospital Stay (HOSPITAL_COMMUNITY): Payer: Commercial Managed Care - PPO | Admitting: Anesthesiology

## 2022-02-18 ENCOUNTER — Other Ambulatory Visit: Payer: Self-pay

## 2022-02-18 ENCOUNTER — Encounter (HOSPITAL_COMMUNITY): Payer: Self-pay | Admitting: Urology

## 2022-02-18 ENCOUNTER — Inpatient Hospital Stay (HOSPITAL_COMMUNITY): Payer: Commercial Managed Care - PPO | Admitting: Physician Assistant

## 2022-02-18 ENCOUNTER — Inpatient Hospital Stay (HOSPITAL_COMMUNITY): Payer: Commercial Managed Care - PPO

## 2022-02-18 DIAGNOSIS — Z87442 Personal history of urinary calculi: Secondary | ICD-10-CM | POA: Diagnosis not present

## 2022-02-18 DIAGNOSIS — Z8249 Family history of ischemic heart disease and other diseases of the circulatory system: Secondary | ICD-10-CM | POA: Diagnosis not present

## 2022-02-18 DIAGNOSIS — M1990 Unspecified osteoarthritis, unspecified site: Secondary | ICD-10-CM | POA: Diagnosis present

## 2022-02-18 DIAGNOSIS — Z832 Family history of diseases of the blood and blood-forming organs and certain disorders involving the immune mechanism: Secondary | ICD-10-CM | POA: Diagnosis not present

## 2022-02-18 DIAGNOSIS — D573 Sickle-cell trait: Secondary | ICD-10-CM | POA: Diagnosis present

## 2022-02-18 DIAGNOSIS — Z885 Allergy status to narcotic agent status: Secondary | ICD-10-CM | POA: Diagnosis not present

## 2022-02-18 DIAGNOSIS — R8271 Bacteriuria: Secondary | ICD-10-CM | POA: Diagnosis present

## 2022-02-18 DIAGNOSIS — Z6841 Body Mass Index (BMI) 40.0 and over, adult: Secondary | ICD-10-CM

## 2022-02-18 DIAGNOSIS — Z9842 Cataract extraction status, left eye: Secondary | ICD-10-CM

## 2022-02-18 DIAGNOSIS — Z01818 Encounter for other preprocedural examination: Secondary | ICD-10-CM

## 2022-02-18 DIAGNOSIS — I10 Essential (primary) hypertension: Secondary | ICD-10-CM

## 2022-02-18 DIAGNOSIS — N200 Calculus of kidney: Principal | ICD-10-CM

## 2022-02-18 DIAGNOSIS — Z9841 Cataract extraction status, right eye: Secondary | ICD-10-CM | POA: Diagnosis not present

## 2022-02-18 DIAGNOSIS — Z20822 Contact with and (suspected) exposure to covid-19: Secondary | ICD-10-CM | POA: Diagnosis present

## 2022-02-18 DIAGNOSIS — Z961 Presence of intraocular lens: Secondary | ICD-10-CM | POA: Diagnosis present

## 2022-02-18 DIAGNOSIS — Z90722 Acquired absence of ovaries, bilateral: Secondary | ICD-10-CM

## 2022-02-18 DIAGNOSIS — Z90710 Acquired absence of both cervix and uterus: Secondary | ICD-10-CM | POA: Diagnosis not present

## 2022-02-18 HISTORY — PX: NEPHROLITHOTOMY: SHX5134

## 2022-02-18 HISTORY — PX: CYSTOSCOPY W/ URETERAL STENT PLACEMENT: SHX1429

## 2022-02-18 LAB — HEMOGLOBIN AND HEMATOCRIT, BLOOD
HCT: 39.2 % (ref 36.0–46.0)
Hemoglobin: 12.4 g/dL (ref 12.0–15.0)

## 2022-02-18 SURGERY — NEPHROLITHOTOMY PERCUTANEOUS
Anesthesia: General | Laterality: Right

## 2022-02-18 MED ORDER — ACETAMINOPHEN 10 MG/ML IV SOLN: 1000.0000 mg | Freq: Once | INTRAVENOUS | Status: DC | PRN

## 2022-02-18 MED ORDER — ROCURONIUM BROMIDE 10 MG/ML (PF) SYRINGE
PREFILLED_SYRINGE | INTRAVENOUS | Status: DC | PRN
  Administered 2022-02-18: 10 mg via INTRAVENOUS
  Administered 2022-02-18: 70 mg via INTRAVENOUS

## 2022-02-18 MED ORDER — SODIUM CHLORIDE 0.9 % IR SOLN
Status: DC | PRN
Start: 2022-02-18 — End: 2022-02-18
  Administered 2022-02-18 (×7): 3000 mL

## 2022-02-18 MED ORDER — PHENYLEPHRINE HCL-NACL 20-0.9 MG/250ML-% IV SOLN
INTRAVENOUS | Status: DC | PRN
  Administered 2022-02-18: 40 ug/min via INTRAVENOUS

## 2022-02-18 MED ORDER — MIDAZOLAM HCL 2 MG/2ML IJ SOLN
INTRAMUSCULAR | Status: AC
  Filled 2022-02-18: qty 2

## 2022-02-18 MED ORDER — FENTANYL CITRATE PF 50 MCG/ML IJ SOSY
PREFILLED_SYRINGE | INTRAMUSCULAR | Status: AC
  Filled 2022-02-18: qty 2

## 2022-02-18 MED ORDER — HYDROMORPHONE HCL 2 MG PO TABS
1.0000 mg | ORAL_TABLET | ORAL | Status: DC | PRN
  Administered 2022-02-18 – 2022-02-24 (×5): 1 mg via ORAL
  Filled 2022-02-18 (×6): qty 1

## 2022-02-18 MED ORDER — HYDROMORPHONE HCL 1 MG/ML IJ SOLN
0.5000 mg | INTRAMUSCULAR | Status: DC | PRN
  Administered 2022-02-18 – 2022-02-23 (×32): 1 mg via INTRAVENOUS
  Filled 2022-02-18 (×33): qty 1

## 2022-02-18 MED ORDER — SENNOSIDES-DOCUSATE SODIUM 8.6-50 MG PO TABS
1.0000 | ORAL_TABLET | Freq: Two times a day (BID) | ORAL | Status: DC
  Administered 2022-02-18 – 2022-02-24 (×12): 1 via ORAL
  Filled 2022-02-18 (×12): qty 1

## 2022-02-18 MED ORDER — GENTAMICIN SULFATE 40 MG/ML IJ SOLN
5.0000 mg/kg | INTRAVENOUS | Status: AC
  Administered 2022-02-18: 450 mg via INTRAVENOUS
  Filled 2022-02-18: qty 11.25

## 2022-02-18 MED ORDER — PROPOFOL 10 MG/ML IV BOLUS
INTRAVENOUS | Status: DC | PRN
  Administered 2022-02-18: 200 mg via INTRAVENOUS

## 2022-02-18 MED ORDER — LACTATED RINGERS IV SOLN: INTRAVENOUS | Status: DC

## 2022-02-18 MED ORDER — ROCURONIUM BROMIDE 10 MG/ML (PF) SYRINGE
PREFILLED_SYRINGE | INTRAVENOUS | Status: AC
  Filled 2022-02-18: qty 10

## 2022-02-18 MED ORDER — MIDAZOLAM HCL 5 MG/5ML IJ SOLN
INTRAMUSCULAR | Status: DC | PRN
  Administered 2022-02-18: 2 mg via INTRAVENOUS

## 2022-02-18 MED ORDER — ACETAMINOPHEN 500 MG PO TABS
1000.0000 mg | ORAL_TABLET | Freq: Three times a day (TID) | ORAL | Status: AC
  Administered 2022-02-18 – 2022-02-22 (×13): 1000 mg via ORAL
  Filled 2022-02-18 (×15): qty 2

## 2022-02-18 MED ORDER — PHENYLEPHRINE HCL (PRESSORS) 10 MG/ML IV SOLN
INTRAVENOUS | Status: AC
  Filled 2022-02-18: qty 1

## 2022-02-18 MED ORDER — GLYCOPYRROLATE 0.2 MG/ML IJ SOLN
INTRAMUSCULAR | Status: DC | PRN
  Administered 2022-02-18: .1 mg via INTRAVENOUS

## 2022-02-18 MED ORDER — ORAL CARE MOUTH RINSE: 15.0000 mL | Freq: Once | OROMUCOSAL | Status: AC

## 2022-02-18 MED ORDER — SUGAMMADEX SODIUM 200 MG/2ML IV SOLN
INTRAVENOUS | Status: DC | PRN
  Administered 2022-02-18: 200 mg via INTRAVENOUS

## 2022-02-18 MED ORDER — IOHEXOL 300 MG/ML  SOLN
INTRAMUSCULAR | Status: DC | PRN
  Administered 2022-02-18: 25 mL

## 2022-02-18 MED ORDER — DEXAMETHASONE SODIUM PHOSPHATE 10 MG/ML IJ SOLN
INTRAMUSCULAR | Status: AC
  Filled 2022-02-18: qty 1

## 2022-02-18 MED ORDER — PHENYLEPHRINE 40 MCG/ML (10ML) SYRINGE FOR IV PUSH (FOR BLOOD PRESSURE SUPPORT)
PREFILLED_SYRINGE | INTRAVENOUS | Status: DC | PRN
  Administered 2022-02-18 (×3): 80 ug via INTRAVENOUS
  Administered 2022-02-18: 120 ug via INTRAVENOUS

## 2022-02-18 MED ORDER — EPHEDRINE 5 MG/ML INJ
INTRAVENOUS | Status: AC
  Filled 2022-02-18: qty 5

## 2022-02-18 MED ORDER — PHENYLEPHRINE 40 MCG/ML (10ML) SYRINGE FOR IV PUSH (FOR BLOOD PRESSURE SUPPORT)
PREFILLED_SYRINGE | INTRAVENOUS | Status: AC
  Filled 2022-02-18: qty 10

## 2022-02-18 MED ORDER — FENTANYL CITRATE (PF) 100 MCG/2ML IJ SOLN
INTRAMUSCULAR | Status: DC | PRN
  Administered 2022-02-18: 100 ug via INTRAVENOUS

## 2022-02-18 MED ORDER — ONDANSETRON HCL 4 MG/2ML IJ SOLN
INTRAMUSCULAR | Status: AC
  Filled 2022-02-18: qty 2

## 2022-02-18 MED ORDER — CHLORHEXIDINE GLUCONATE 0.12 % MT SOLN
15.0000 mL | Freq: Once | OROMUCOSAL | Status: AC
  Administered 2022-02-18: 15 mL via OROMUCOSAL

## 2022-02-18 MED ORDER — 0.9 % SODIUM CHLORIDE (POUR BTL) OPTIME
TOPICAL | Status: DC | PRN
  Administered 2022-02-18: 1000 mL

## 2022-02-18 MED ORDER — EPHEDRINE SULFATE-NACL 50-0.9 MG/10ML-% IV SOSY
PREFILLED_SYRINGE | INTRAVENOUS | Status: DC | PRN
  Administered 2022-02-18: 10 mg via INTRAVENOUS

## 2022-02-18 MED ORDER — SODIUM CHLORIDE 0.9 % IV SOLN: INTRAVENOUS | Status: DC

## 2022-02-18 MED ORDER — FENTANYL CITRATE (PF) 100 MCG/2ML IJ SOLN
INTRAMUSCULAR | Status: AC
  Filled 2022-02-18: qty 2

## 2022-02-18 MED ORDER — GLYCOPYRROLATE 0.2 MG/ML IJ SOLN
INTRAMUSCULAR | Status: AC
  Filled 2022-02-18: qty 1

## 2022-02-18 MED ORDER — DEXAMETHASONE SODIUM PHOSPHATE 10 MG/ML IJ SOLN
INTRAMUSCULAR | Status: DC | PRN
  Administered 2022-02-18: 10 mg via INTRAVENOUS

## 2022-02-18 MED ORDER — ONDANSETRON HCL 4 MG/2ML IJ SOLN
INTRAMUSCULAR | Status: DC | PRN
  Administered 2022-02-18: 4 mg via INTRAVENOUS

## 2022-02-18 MED ORDER — LIDOCAINE 2% (20 MG/ML) 5 ML SYRINGE
INTRAMUSCULAR | Status: DC | PRN
  Administered 2022-02-18: 50 mg via INTRAVENOUS

## 2022-02-18 MED ORDER — ACETAMINOPHEN 500 MG PO TABS
1000.0000 mg | ORAL_TABLET | Freq: Once | ORAL | Status: AC
  Administered 2022-02-18: 1000 mg via ORAL
  Filled 2022-02-18: qty 2

## 2022-02-18 MED ORDER — FENTANYL CITRATE PF 50 MCG/ML IJ SOSY
25.0000 ug | PREFILLED_SYRINGE | INTRAMUSCULAR | Status: DC | PRN
  Administered 2022-02-18: 50 ug via INTRAVENOUS

## 2022-02-18 MED ORDER — PROPOFOL 10 MG/ML IV BOLUS
INTRAVENOUS | Status: AC
  Filled 2022-02-18: qty 20

## 2022-02-18 SURGICAL SUPPLY — 78 items
AGENT HMST KT MTR STRL THRMB (HEMOSTASIS)
APL PRP STRL LF DISP 70% ISPRP (MISCELLANEOUS) ×1
APL SKNCLS STERI-STRIP NONHPOA (GAUZE/BANDAGES/DRESSINGS) ×2
BAG COUNTER SPONGE SURGICOUNT (BAG) IMPLANT
BAG DRN RND TRDRP ANRFLXCHMBR (UROLOGICAL SUPPLIES) ×1
BAG SPNG CNTER NS LX DISP (BAG)
BAG URINE DRAIN 2000ML AR STRL (UROLOGICAL SUPPLIES) ×1 IMPLANT
BAG URO CATCHER STRL LF (MISCELLANEOUS) ×2 IMPLANT
BASKET LASER NITINOL 1.9FR (BASKET) IMPLANT
BASKET ZERO TIP NITINOL 2.4FR (BASKET) IMPLANT
BENZOIN TINCTURE PRP APPL 2/3 (GAUZE/BANDAGES/DRESSINGS) ×3 IMPLANT
BLADE SURG 15 STRL LF DISP TIS (BLADE) ×1 IMPLANT
BLADE SURG 15 STRL SS (BLADE) ×2
BSKT STON RTRVL 120 1.9FR (BASKET)
BSKT STON RTRVL ZERO TP 2.4FR (BASKET)
CATH FOLEY 2W COUNCIL 20FR 5CC (CATHETERS) IMPLANT
CATH FOLEY 2WAY SLVR  5CC 16FR (CATHETERS) ×2
CATH FOLEY 2WAY SLVR 5CC 16FR (CATHETERS) ×1 IMPLANT
CATH MULTI PURPOSE 16FR DRAIN (CATHETERS) IMPLANT
CATH ROBINSON RED A/P 20FR (CATHETERS) IMPLANT
CATH ULTRATHANE 14FR (CATHETERS) ×1 IMPLANT
CATH URETL OPEN END 6FR 70 (CATHETERS) ×1 IMPLANT
CATH UROLOGY TORQUE 40 (MISCELLANEOUS) ×2 IMPLANT
CATH X-FORCE N30 NEPHROSTOMY (TUBING) ×1 IMPLANT
CHLORAPREP W/TINT 26 (MISCELLANEOUS) ×3 IMPLANT
CLOTH BEACON ORANGE TIMEOUT ST (SAFETY) ×2 IMPLANT
DRAPE C-ARM 42X120 X-RAY (DRAPES) ×2 IMPLANT
DRAPE LINGEMAN PERC (DRAPES) ×2 IMPLANT
DRAPE SHEET LG 3/4 BI-LAMINATE (DRAPES) ×2 IMPLANT
DRAPE SURG IRRIG POUCH 19X23 (DRAPES) ×2 IMPLANT
DRSG PAD ABDOMINAL 8X10 ST (GAUZE/BANDAGES/DRESSINGS) ×4 IMPLANT
DRSG TEGADERM 4X4.75 (GAUZE/BANDAGES/DRESSINGS) IMPLANT
DRSG TEGADERM 8X12 (GAUZE/BANDAGES/DRESSINGS) ×4 IMPLANT
GAUZE SPONGE 4X4 12PLY STRL (GAUZE/BANDAGES/DRESSINGS) ×1 IMPLANT
GLOVE SURG ENC TEXT LTX SZ7.5 (GLOVE) ×2 IMPLANT
GOWN STRL REUS W/TWL LRG LVL3 (GOWN DISPOSABLE) ×3 IMPLANT
GUIDEWIRE AMPLAZ .035X145 (WIRE) ×4 IMPLANT
GUIDEWIRE ANG ZIPWIRE 035X150 (WIRE) ×1 IMPLANT
GUIDEWIRE ANG ZIPWIRE 038X150 (WIRE) ×2 IMPLANT
GUIDEWIRE STR DUAL SENSOR (WIRE) ×1 IMPLANT
IV SET EXTENSION CATH 6 NF (IV SETS) ×1 IMPLANT
KIT BASIN OR (CUSTOM PROCEDURE TRAY) ×2 IMPLANT
KIT PROBE 340X3.4XDISP GRN (MISCELLANEOUS) IMPLANT
KIT PROBE TRILOGY 3.4X340 (MISCELLANEOUS)
KIT PROBE TRILOGY 3.9X350 (MISCELLANEOUS) ×1 IMPLANT
KIT TURNOVER KIT A (KITS) ×1 IMPLANT
LASER FIB FLEXIVA PULSE ID 365 (Laser) IMPLANT
LASER FIB FLEXIVA PULSE ID 550 (Laser) IMPLANT
LASER FIB FLEXIVA PULSE ID 910 (Laser) IMPLANT
MANIFOLD NEPTUNE II (INSTRUMENTS) ×3 IMPLANT
NDL TROCAR 18X15 ECHO (NEEDLE) IMPLANT
NDL TROCAR 18X20 (NEEDLE) IMPLANT
NEEDLE TROCAR 18X15 ECHO (NEEDLE) IMPLANT
NEEDLE TROCAR 18X20 (NEEDLE) IMPLANT
NS IRRIG 1000ML POUR BTL (IV SOLUTION) ×2 IMPLANT
PACK CYSTO (CUSTOM PROCEDURE TRAY) ×2 IMPLANT
SHEATH PEELAWAY SET 9 (SHEATH) ×1 IMPLANT
SPONGE T-LAP 4X18 ~~LOC~~+RFID (SPONGE) ×2 IMPLANT
SURGIFLO W/THROMBIN 8M KIT (HEMOSTASIS) IMPLANT
SUT SILK 0 (SUTURE) ×2
SUT SILK 0 30XBRD TIE 6 (SUTURE) IMPLANT
SUT SILK 2 0 30  PSL (SUTURE) ×2
SUT SILK 2 0 30 PSL (SUTURE) ×1 IMPLANT
SUT VIC AB 2-0 CT1 27 (SUTURE)
SUT VIC AB 2-0 CT1 TAPERPNT 27 (SUTURE) IMPLANT
SYR 10ML LL (SYRINGE) ×2 IMPLANT
SYR 20ML LL LF (SYRINGE) ×4 IMPLANT
SYR 50ML LL SCALE MARK (SYRINGE) ×2 IMPLANT
TOWEL OR 17X26 10 PK STRL BLUE (TOWEL DISPOSABLE) ×2 IMPLANT
TRACTIP FLEXIVA PULS ID 200XHI (Laser) IMPLANT
TRACTIP FLEXIVA PULSE ID 200 (Laser)
TRAY FOLEY MTR SLVR 16FR STAT (SET/KITS/TRAYS/PACK) ×2 IMPLANT
TUBE CONNECTING VINYL 14FR 30C (TUBING) ×1 IMPLANT
TUBING CONNECTING 10 (TUBING) ×4 IMPLANT
TUBING STONE CATCHER TRILOGY (MISCELLANEOUS) ×1 IMPLANT
TUBING UROLOGY SET (TUBING) ×1 IMPLANT
WATER STERILE IRR 1000ML POUR (IV SOLUTION) ×2 IMPLANT
WATER STERILE IRR 3000ML UROMA (IV SOLUTION) ×2 IMPLANT

## 2022-02-18 NOTE — Anesthesia Procedure Notes (Signed)
Procedure Name: Intubation Date/Time: 02/18/2022 8:36 AM Performed by: Gerald Leitz, CRNA Pre-anesthesia Checklist: Patient identified, Patient being monitored, Timeout performed, Emergency Drugs available and Suction available Patient Re-evaluated:Patient Re-evaluated prior to induction Oxygen Delivery Method: Circle system utilized Preoxygenation: Pre-oxygenation with 100% oxygen Induction Type: IV induction Ventilation: Mask ventilation without difficulty Laryngoscope Size: Mac and 4 Grade View: Grade I Tube type: Oral Tube size: 7.0 mm Number of attempts: 1 Airway Equipment and Method: Stylet Placement Confirmation: ETT inserted through vocal cords under direct vision, positive ETCO2 and breath sounds checked- equal and bilateral Secured at: 23 cm Tube secured with: Tape Dental Injury: Teeth and Oropharynx as per pre-operative assessment

## 2022-02-18 NOTE — H&P (Signed)
[name] is an 61 y.o. female.    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 Renogram 2023 with Rt 40% relative function, no obstruction. Cr 0.9. Open retroperitoneal window upper and lower pole. ??  2 - Bacteruria - proteus coloniztiaon as expected noted 2022 RES nitro, but sens all others ??  PMH sig for morbid obesity (eval for weight loss surgery pending with Ok Anis MD), OA.Marland Kitchen Her PCP is Collene Mares PA in [place]. ?  ?Today "[name]" is seen to proceed with RIGHT 1st stage PCNL for massive stone. No interval fevers. She has been on Bactirm pre-op to reduce colonization. Hgb 13, Cr <1.   Past Medical History:  Diagnosis Date   Arthritis    Bronchitis    History of kidney stones    Hypertension    Obesity    Sickle cell trait ([place])     Past Surgical History:  Procedure Laterality Date   ABDOMINAL HYSTERECTOMY N/A 07/10/2014   Procedure: HYSTERECTOMY ABDOMINAL;  Surgeon: Jonnie Kind, MD;  Location: [facility];  Service: Gynecology;  Laterality: N/A;   BACK SURGERY     CATARACT EXTRACTION W/PHACO Left 10/10/2018   Procedure: CATARACT EXTRACTION PHACO AND INTRAOCULAR LENS PLACEMENT ([place]);  Surgeon: Tonny Branch, MD;  Location: [facility];  Service: Ophthalmology;  Laterality: Left;  CDE: 2.63   CATARACT EXTRACTION W/PHACO Right 10/27/2018   Procedure: CATARACT EXTRACTION PHACO AND INTRAOCULAR LENS PLACEMENT RIGHT EYE ;  Surgeon: Tonny Branch, MD;  Location: [facility];  Service: Ophthalmology;  Laterality: Right;  right   COLONOSCOPY N/A 07/21/2013   Procedure: COLONOSCOPY;  Surgeon: Danie Binder, MD;  Location: [facility];  Service: Endoscopy;  Laterality: N/A;  10:15 AM   CYST EXCISION Right    knee   SALPINGOOPHORECTOMY Bilateral 07/10/2014   Procedure: SALPINGO OOPHORECTOMY;  Surgeon: Jonnie Kind, MD;   Location: [facility];  Service: Gynecology;  Laterality: Bilateral;   TUBAL LIGATION     WRIST SURGERY      Family History  Problem Relation Age of Onset   Hypertension Mother    Sickle cell trait Mother    Heart attack Father    Sickle cell anemia Sister    Sickle cell trait Sister    Social History:  reports that she has never smoked. She has never used smokeless tobacco. She reports that she does not drink alcohol and does not use drugs.  Allergies:  Allergies  Allergen Reactions   Oxycodone Shortness Of Breath    No medications prior to admission.    Results for orders placed or performed during the hospital encounter of 02/16/22 (from the past 48 hour(s))  SARS CORONAVIRUS 2 (TAT 6-24 HRS) Nasopharyngeal Nasopharyngeal Swab     Status: None   Collection Time: 02/16/22  6:59 AM   Specimen: Nasopharyngeal Swab  Result Value Ref Range   SARS Coronavirus 2 NEGATIVE NEGATIVE    Comment: (NOTE) SARS-CoV-2 target nucleic acids are NOT DETECTED.  The SARS-CoV-2 RNA is generally detectable in upper and lower respiratory specimens during the acute phase of infection. Negative results do not preclude SARS-CoV-2 infection, do not rule out co-infections with other pathogens, and should not be used as the sole basis for treatment or other patient management decisions. Negative results must be combined with clinical observations, patient history, and epidemiological information. The expected  result is Negative.  Fact Sheet for Patients: SugarRoll.be  Fact Sheet for Healthcare Providers: https://www.woods-mathews.com/  This test is not yet approved or cleared by the [place] FDA and  has been authorized for detection and/or diagnosis of SARS-CoV-2 by FDA under an Emergency Use Authorization (EUA). This EUA will remain  in effect (meaning this test can be used) for the duration of the COVID-19 declaration under Se ction 564(b)(1) of the  Act, 21 U.S.C. section 360bbb-3(b)(1), unless the authorization is terminated or revoked sooner.  Performed at [facility], [place] [address] [place], [place] [address]    No results found.  Review of Systems  Constitutional:  Negative for chills and fever.  All other systems reviewed and are negative.  Last menstrual period 06/19/2014. Physical Exam Vitals reviewed.  HENT:     Head: Normocephalic.     Nose: Nose normal.  Eyes:     Pupils: Pupils are equal, round, and reactive to light.  Cardiovascular:     Rate and Rhythm: Normal rate.     Pulses: Normal pulses.  Pulmonary:     Effort: Pulmonary effort is normal.  Abdominal:     Comments: Stable very large truncal opbesity  Genitourinary:    Comments: No CVAT at present Musculoskeletal:        General: Normal range of motion.  Skin:    General: Skin is warm.  Neurological:     General: No focal deficit present.     Mental Status: She is alert.  Psychiatric:        Mood and Affect: Mood normal.     Assessment/Plan  Proceed as planned with cysto, Rt retrograde / Stent, Rt 1st stage PCNL with access. RIsks, benefits, alternaives, expected peri-op course, need for staged approach discussed previously and reiterated today. She understands that her body habitus and metabolic comorbidity increases risk orf al peri-op complications.   Alexis Frock, MD 02/18/2022, 5:37 AM

## 2022-02-18 NOTE — Op Note (Signed)
[name], [name] MEDICAL RECORD NO: [id number] ACCOUNT NO: [id number] DATE OF BIRTH: 1961-11-01 FACILITY: [facility] LOCATION: [id number] PHYSICIAN: Alexis Frock, MD  Operative Report   DATE OF PROCEDURE: 02/18/2022  PREOPERATIVE DIAGNOSIS:  Right complete staghorn kidney stone.  PROCEDURES PERFORMED:   1.  Cystoscopy with right retrograde pyelogram and interpretation. 2.  Insertion of right ureteral stent. 3.  Right first stage percutaneous nephrostolithotomy, complex stone greater than 2 cm. 4.  Right antegrade nephrostogram interpretation. 5.  Insertion of right nephrostomy tube. 6.  Percutaneous access to the right kidney with dilation of tract.  ESTIMATED BLOOD LOSS:  150 mL.  COMPLICATIONS:  None.  SPECIMEN:  Stone fragments irrigated, suctioned, discarded.  FINDINGS:   1.  Successful access into a lower pole posterior calyx and upper mid pole calyx. 2.  Right complete staghorn kidney stone. 3.  Successful dilation of the right inferior access tract. 4.  Successful ablation of estimated 65-70% of stone volume was a lower pole renal pelvis. 4.  Successful placement of right nephrostomy tube. 5.  Successful placement of upper and lower access nephroureteral stent.  DRAINS:   1.  Right nephrostomy tube drainage. 2.  Foley catheter to gravity drainage. 3.  Right nephroureteral stent capped x2.  INDICATIONS:  The patient is a very pleasant, but quite comorbid 61 year old lady with history of morbid obesity.  She was found incidentally during evaluation for possible weight loss surgery to have a right complete staghorn kidney stone. Renogram  corroborated residual acceptable renal function and no high grade distal obstruction.  Options were discussed and recommended path of right multistaged percutaneous nephrostolithotomy is being optimal with goal of stone free.  She wished to proceed.   Informed consent was obtained and placed in medical record.  The patient been on  preoperative antibiotics according to culture.  PROCEDURE IN DETAIL:  The patient is being verified as herself, procedure being cystoscopy, bilateral retrograde and stent and right first stage PCNL was confirmed.  Procedure timeout was performed.  Intravenous antibiotics were administered.  General  endotracheal anesthesia induced.  The patient was placed into a low lithotomy position.  Sterile field was created, prepped and draped the patient's vagina, introitus, and proximal thighs using iodine.  Cystourethroscopy was performed using 21-French  rigid cystoscope with offset lens.  Inspection of urinary bladder revealed no diverticula, calcifications, papillary lesions.  The right ureteral orifice was cannulated with a 6-French end-hole catheter and right retrograde pyelogram was obtained.  Right retrograde pyelogram demonstrated single right ureter, single system right kidney.  There was a massive staghorn kidney stone as expected completely filling up all visualized calyces.  A ZIPwire was advanced to the level of the renal pelvis and the  open-ended catheter was advanced at the same location and left as an externalized stent.  A Foley catheter was placed free to straight drain, 10 mL water in the balloon and the externalized stent was fashioned to this using silk tie x2.  The patient was  repositioned into a prone position employing axillary roll prone view, shoulder rolls padding of her knees and ankles to table flexion provided, create a distance in the 12th rib and iliac crest.  Excess adipose tissue was retracted with very careful  taping to allow better access to her right flank, which was then prepped with chlorhexidine gluconate and the externalized stent was prepped into the operative field as well for percutaneous access.  Using retrograde filling of the kidney via externalized stent and dual plane C-arm  suitable lower pole calyx for initial access was identified.  Her kidney was much more  superior than anticipated even with Valsalva and even lower pole access required  angling of the 12th rib using angling technique lower pole calyx was cannulated as verified by efflux of the simple urine. Angle-tipped ZIPwire was advanced to the level of the renal pelvis over which a KMP type catheter was advanced and using this to  steer the wire.  ZIPwire was advanced to the level of the distal ureter. The KMP catheter was advanced over this allowing placement of a Super Stiff wire to the level of the urinary bladder.  The peel-away sheath was then advanced antegrade under  continuous fluoroscopic guidance to the level of proximal left ureter. A second ZIPwire was advanced to the level of the bladder and exchanged for an second Super Stiff wire via the KMP catheter.  Having obtained a dual Super Stiff access all the way  from the access point to the level of the bladder a percutaneous drape was applied.  Given the very large complex nature of the stone, it was felt that likely an upper access would be advantageous as well and using a bull's eye technique with a Chiba  needle the upper, middle calyx was cannulated.  This was an intercostal.  A ZIPwire was advanced to the level of the proximal ureter and a second KMP catheter advanced acting externalized nephroureteral stent and set aside to be used later if needed.   The 30-French NephroMax balloon dilation apparatus was carefully advanced across the Super Stiff wire, the lower pole calyx inflated to a pressure of 20 atmospheres, held for 90 seconds and sheath was advanced.  Rigid nephroscopy revealed excellent  placement of sheath in continuous apposition to lower pole stone.  Next, using the Trilogy dual ultrasound pneumatic energy very careful ablation of the stone was performed, clearing the lower pole in the renal pelvis area completely. Access did allow  some clearance of upper pole infundibulum as well.  I was extremely happy with the amount of stone  removed today, estimated to be approximately 60-70% of total volume.  No evidence of perforation was noted.  Hemostasis was quite good.  The patient has  been in prone position for over 2 hours.  The upper access nephroureteral stent was visualized clearly and this would be a staged approach to achieve the goal, first staged procedure today. ZIPwire was advanced to the level of the proximal left ureter over which a  14-French nephrostomy tube was carefully placed and then coiled in the level of the renal pelvis under fluoroscopic guidance.  A KMP catheter was placed over the super stiff safety wire to the level of the urinary bladder.  All superior wire was removed.   The previous externalized lower stent was removed.  The right upper and lower nephroureteral stents were capped, fashioned in place with interrupted silk, nephrostomy tube was fashioned in place with interrupted silk and connected to gravity drainage.   Percutaneous dressing was applied.  Procedure was terminated.  The patient tolerated the procedure well, no immediate perioperative complications.  The patient was taken to postanesthesia care unit in stable condition.  Plan for inpatient admission and proceed with second stage procedure day after tomorrow pending her clinical status.   PUS D: 02/18/2022 11:23:54 am T: 02/18/2022 6:31:00 pm  JOB: [id number]/ [id number]

## 2022-02-18 NOTE — Brief Op Note (Signed)
02/18/2022  11:14 AM  PATIENT:  [name]  61 y.o. female  PRE-OPERATIVE DIAGNOSIS:  RIGHT COMPLETE STAGHORN KIDNEY STONE  POST-OPERATIVE DIAGNOSIS:  RIGHT COMPLETE STAGHORN KIDNEY STONE  PROCEDURE:  Procedure(s) with comments: FIRST STAGE NEPHROLITHOTOMY PERCUTANEOUS WITH SURGEON ACCESS (Right) - 3 HRS CYSTOSCOPY WITH RETROGRADE PYELOGRAM/URETERAL STENT PLACEMENT (Right)  SURGEON:  Surgeon(s) and Role:    Alexis Frock, MD - Primary  PHYSICIAN ASSISTANT:   ASSISTANTS: none   ANESTHESIA:   general  EBL:  149mL   BLOOD ADMINISTERED:none  DRAINS:  1 - Rt nephrostomy - gravity; 2 - Rt nephroureteral stetn x 2 (capped); 3 - Foley to gravity    LOCAL MEDICATIONS USED:  NONE  SPECIMEN:  No Specimen  DISPOSITION OF SPECIMEN:  N/A  COUNTS:  YES  TOURNIQUET:  * No tourniquets in log *  DICTATION: .Other Dictation: Dictation Number [id number]  PLAN OF CARE: Admit to inpatient   PATIENT DISPOSITION:  PACU - hemodynamically stable.   Delay start of Pharmacological VTE agent (>24hrs) due to surgical blood loss or risk of bleeding: yes

## 2022-02-18 NOTE — Transfer of Care (Signed)
Immediate Anesthesia Transfer of Care Note  Patient: [name]  Procedure(s) Performed: FIRST STAGE NEPHROLITHOTOMY PERCUTANEOUS WITH SURGEON ACCESS (Right) CYSTOSCOPY WITH RETROGRADE PYELOGRAM/URETERAL STENT PLACEMENT (Right)  Patient Location: PACU  Anesthesia Type:General  Level of Consciousness: awake, alert , oriented and patient cooperative  Airway & Oxygen Therapy: Patient Spontanous Breathing and Patient connected to face mask oxygen  Post-op Assessment: Report given to RN, Post -op Vital signs reviewed and stable and Patient moving all extremities  Post vital signs: Reviewed and stable  Last Vitals:  Vitals Value Taken Time  BP 121/82 02/18/22 1138  Temp    Pulse 77 02/18/22 1140  Resp 18 02/18/22 1140  SpO2 98 % 02/18/22 1140  Vitals shown include unvalidated device data.  Last Pain:  Vitals:   02/18/22 0640  TempSrc: Oral         Complications: No notable events documented.

## 2022-02-18 NOTE — Progress Notes (Signed)
Upon arrival to the unit patient had a brief moment of syncope after being transferred to hospital bed from PACU stretcher. Pt remained in the bed at all times. Patients vials remained stable with BP 106/58, HR 54, RR 20. MD Manny made aware. Pts only complaint was pain, PRN pain meds given and were effective in treating pain.

## 2022-02-18 NOTE — Discharge Instructions (Signed)
1 - You may have urinary urgency (bladder spasms) and bloody urine on / off with stent in place. This is normal. ° °2 - Call MD or go to ER for fever >102, severe pain / nausea / vomiting not relieved by medications, or acute change in medical status ° °

## 2022-02-18 NOTE — Anesthesia Postprocedure Evaluation (Signed)
Anesthesia Post Note  Patient: [name]  Procedure(s) Performed: FIRST STAGE NEPHROLITHOTOMY PERCUTANEOUS WITH SURGEON ACCESS (Right) CYSTOSCOPY WITH RETROGRADE PYELOGRAM/URETERAL STENT PLACEMENT (Right)     Patient location during evaluation: PACU Anesthesia Type: General Level of consciousness: awake and alert Pain management: pain level controlled Vital Signs Assessment: post-procedure vital signs reviewed and stable Respiratory status: spontaneous breathing, nonlabored ventilation, respiratory function stable and patient connected to nasal cannula oxygen Cardiovascular status: blood pressure returned to baseline and stable Postop Assessment: no apparent nausea or vomiting Anesthetic complications: no   No notable events documented.  Last Vitals:  Vitals:   02/18/22 1504 02/18/22 1824  BP: (!) 121/58 122/75  Pulse: (!) 53 68  Resp:  20  Temp:  36.6 C  SpO2:  98%    Last Pain:  Vitals:   02/18/22 1824  TempSrc: Oral  PainSc:                  [name] Gaspard Isbell

## 2022-02-19 ENCOUNTER — Encounter (HOSPITAL_COMMUNITY): Payer: Self-pay | Admitting: Urology

## 2022-02-19 LAB — BASIC METABOLIC PANEL
Anion gap: 4 — ABNORMAL LOW (ref 5–15)
BUN: 14 mg/dL (ref 6–20)
CO2: 25 mmol/L (ref 22–32)
Calcium: 9 mg/dL (ref 8.9–10.3)
Chloride: 105 mmol/L (ref 98–111)
Creatinine, Ser: 1.06 mg/dL — ABNORMAL HIGH (ref 0.44–1.00)
GFR, Estimated: >60 mL/min (ref >60)
Glucose, Bld: 112 mg/dL — ABNORMAL HIGH (ref 70–99)
Potassium: 4 mmol/L (ref 3.5–5.1)
Sodium: 134 mmol/L — ABNORMAL LOW (ref 135–145)

## 2022-02-19 LAB — HEMOGLOBIN AND HEMATOCRIT, BLOOD
HCT: 33.9 % — ABNORMAL LOW (ref 36.0–46.0)
Hemoglobin: 10.8 g/dL — ABNORMAL LOW (ref 12.0–15.0)

## 2022-02-19 MED ORDER — CHLORHEXIDINE GLUCONATE CLOTH 2 % EX PADS
6.0000 | MEDICATED_PAD | Freq: Every day | CUTANEOUS | Status: DC
  Administered 2022-02-19 – 2022-02-23 (×4): 6 via TOPICAL

## 2022-02-19 NOTE — Progress Notes (Signed)
1 Day Post-Op   Subjective/Chief Complaint:  1 - RIGHT Staghorn Kidney Stone - s/p RIGHT 1st stage percutaneous nephrostolithotomy + renal access 02/18/2022. Majority of lower pole and renal pelvis component of stone adressed.   Today "[name]" is stable. Hgb acceptable, no fevers.    Objective: Vital signs in last 24 hours: Temp:  [96 F (35.6 C)-98.5 F (36.9 C)] 98.1 F (36.7 C) (02/23 0730) Pulse Rate:  [53-91] 67 (02/23 0730) Resp:  [16-25] 20 (02/23 0730) BP: (95-127)/(52-82) 100/52 (02/23 0730) SpO2:  [90 %-99 %] 94 % (02/23 0540) Last BM Date : 02/17/22  Intake/Output from previous day: 02/22 0701 - 02/23 0700 In: 3311.6 [P.O.:720; I.V.:2480.4; IV Piggyback:111.3] Out: 2040 [Urine:2025; Blood:15] Intake/Output this shift: No intake/output data recorded.  EXAM: NAD, Stable obesity Non-labored breathing on minimal [place] O2 RRR Rt neph tube with pink urine, nephroureteral stents capped x2 Foley in place with pink urine that is non-foul No c/c/e.    Lab Results:  Recent Labs    02/18/22 1159 02/19/22 0502  HGB 12.4 10.8*  HCT 39.2 33.9*   BMET Recent Labs    02/19/22 0502  NA 134*  K 4.0  CL 105  CO2 25  GLUCOSE 112*  BUN 14  CREATININE 1.06*  CALCIUM 9.0   PT/INR No results for input(s): LABPROT, INR in the last 72 hours. ABG No results for input(s): PHART, HCO3 in the last 72 hours.  Invalid input(s): PCO2, PO2  Studies/Results: DG C-Arm 1-60 Min-No Report  Result Date: 02/18/2022 Fluoroscopy was utilized by the requesting physician.  No radiographic interpretation.   DG C-Arm 1-60 Min-No Report  Result Date: 02/18/2022 Fluoroscopy was utilized by the requesting physician.  No radiographic interpretation.   DG C-Arm 1-60 Min-No Report  Result Date: 02/18/2022 Fluoroscopy was utilized by the requesting physician.  No radiographic interpretation.    Anti-infectives: Anti-infectives (From admission, onward)    Start     Dose/Rate Route  Frequency Ordered Stop   02/18/22 0615  gentamicin (GARAMYCIN) 450 mg in dextrose 5 % 100 mL IVPB        5 mg/kg  89.8 kg (Adjusted) 111.3 mL/hr over 60 Minutes Intravenous 30 min pre-op 02/18/22 0615 02/18/22 0838       Assessment/Plan:  1 - RIGHT Staghorn Kidney Stone - doing well POD 1 s/p 1st stage surgery. WIll proceed as planned with right 2nd stage surgery tomorrow, may require additional renal access , but kidney is quite high. NPO p MN, consent. Discussed that will take at least one more stage, possibly more to achieve stone free.    Alexis Frock 02/19/2022

## 2022-02-20 ENCOUNTER — Inpatient Hospital Stay (HOSPITAL_COMMUNITY): Payer: Commercial Managed Care - PPO | Admitting: Anesthesiology

## 2022-02-20 ENCOUNTER — Inpatient Hospital Stay (HOSPITAL_COMMUNITY): Payer: Commercial Managed Care - PPO

## 2022-02-20 ENCOUNTER — Inpatient Hospital Stay (HOSPITAL_COMMUNITY): Admission: RE | Admit: 2022-02-20 | Payer: Commercial Managed Care - PPO | Source: Home / Self Care | Admitting: Urology

## 2022-02-20 ENCOUNTER — Other Ambulatory Visit: Payer: Self-pay

## 2022-02-20 ENCOUNTER — Encounter (HOSPITAL_COMMUNITY)
Admission: RE | Disposition: A | Discharge status: Home / Self Care | Payer: Self-pay | Source: Home / Self Care | Attending: Urology

## 2022-02-20 DIAGNOSIS — N200 Calculus of kidney: Secondary | ICD-10-CM

## 2022-02-20 DIAGNOSIS — I10 Essential (primary) hypertension: Secondary | ICD-10-CM

## 2022-02-20 DIAGNOSIS — M1990 Unspecified osteoarthritis, unspecified site: Secondary | ICD-10-CM

## 2022-02-20 DIAGNOSIS — D573 Sickle-cell trait: Secondary | ICD-10-CM

## 2022-02-20 HISTORY — PX: HOLMIUM LASER APPLICATION: SHX5852

## 2022-02-20 HISTORY — PX: NEPHROLITHOTOMY: SHX5134

## 2022-02-20 LAB — HEMOGLOBIN AND HEMATOCRIT, BLOOD
HCT: 38.4 % (ref 36.0–46.0)
Hemoglobin: 12.2 g/dL (ref 12.0–15.0)

## 2022-02-20 LAB — TYPE AND SCREEN
ABO/RH(D): O POS
Antibody Screen: NEGATIVE

## 2022-02-20 LAB — POCT I-STAT EG7
Acid-Base Excess: 2 mmol/L (ref 0.0–2.0)
Bicarbonate: 27.5 mmol/L (ref 20.0–28.0)
Calcium, Ion: 1.24 mmol/L (ref 1.15–1.40)
HCT: 31 % — ABNORMAL LOW (ref 36.0–46.0)
Hemoglobin: 10.5 g/dL — ABNORMAL LOW (ref 12.0–15.0)
O2 Saturation: 93 %
Potassium: 4.6 mmol/L (ref 3.5–5.1)
Sodium: 136 mmol/L (ref 135–145)
TCO2: 29 mmol/L (ref 22–32)
pCO2, Ven: 43.6 mmHg — ABNORMAL LOW (ref 44–60)
pH, Ven: 7.409 (ref 7.25–7.43)
pO2, Ven: 66 mmHg — ABNORMAL HIGH (ref 32–45)

## 2022-02-20 LAB — GLUCOSE, CAPILLARY: Glucose-Capillary: 106 mg/dL — ABNORMAL HIGH (ref 70–99)

## 2022-02-20 SURGERY — NEPHROLITHOTOMY PERCUTANEOUS
Anesthesia: General | Laterality: Right

## 2022-02-20 MED ORDER — ONDANSETRON HCL 4 MG/2ML IJ SOLN
INTRAMUSCULAR | Status: DC | PRN
  Administered 2022-02-20: 4 mg via INTRAVENOUS

## 2022-02-20 MED ORDER — AMISULPRIDE (ANTIEMETIC) 5 MG/2ML IV SOLN: 10.0000 mg | Freq: Once | INTRAVENOUS | Status: DC | PRN

## 2022-02-20 MED ORDER — DEXAMETHASONE SODIUM PHOSPHATE 10 MG/ML IJ SOLN
INTRAMUSCULAR | Status: DC | PRN
  Administered 2022-02-20: 10 mg via INTRAVENOUS

## 2022-02-20 MED ORDER — SODIUM CHLORIDE 0.9 % IR SOLN
Status: DC | PRN
  Administered 2022-02-20 (×11): 3000 mL

## 2022-02-20 MED ORDER — ONDANSETRON HCL 4 MG/2ML IJ SOLN: 4.0000 mg | Freq: Once | INTRAMUSCULAR | Status: DC | PRN

## 2022-02-20 MED ORDER — LACTATED RINGERS IV SOLN: INTRAVENOUS | Status: DC | PRN

## 2022-02-20 MED ORDER — MIDAZOLAM HCL 2 MG/2ML IJ SOLN
INTRAMUSCULAR | Status: AC
  Filled 2022-02-20: qty 2

## 2022-02-20 MED ORDER — 0.9 % SODIUM CHLORIDE (POUR BTL) OPTIME
TOPICAL | Status: DC | PRN
  Administered 2022-02-20: 1000 mL

## 2022-02-20 MED ORDER — SUGAMMADEX SODIUM 500 MG/5ML IV SOLN
INTRAVENOUS | Status: AC
  Filled 2022-02-20: qty 5

## 2022-02-20 MED ORDER — LIDOCAINE 2% (20 MG/ML) 5 ML SYRINGE
INTRAMUSCULAR | Status: DC | PRN
  Administered 2022-02-20: 80 mg via INTRAVENOUS

## 2022-02-20 MED ORDER — PHENYLEPHRINE HCL-NACL 20-0.9 MG/250ML-% IV SOLN
INTRAVENOUS | Status: DC | PRN
  Administered 2022-02-20: 40 ug/min via INTRAVENOUS

## 2022-02-20 MED ORDER — SUGAMMADEX SODIUM 500 MG/5ML IV SOLN
INTRAVENOUS | Status: DC | PRN
  Administered 2022-02-20: 500 mg via INTRAVENOUS

## 2022-02-20 MED ORDER — ROCURONIUM BROMIDE 10 MG/ML (PF) SYRINGE
PREFILLED_SYRINGE | INTRAVENOUS | Status: DC | PRN
  Administered 2022-02-20: 100 mg via INTRAVENOUS
  Administered 2022-02-20: 20 mg via INTRAVENOUS

## 2022-02-20 MED ORDER — GENTAMICIN SULFATE 40 MG/ML IJ SOLN
5.0000 mg/kg | INTRAVENOUS | Status: AC
  Administered 2022-02-20: 450 mg via INTRAVENOUS
  Filled 2022-02-20: qty 11.25

## 2022-02-20 MED ORDER — FENTANYL CITRATE (PF) 250 MCG/5ML IJ SOLN
INTRAMUSCULAR | Status: DC | PRN
  Administered 2022-02-20: 100 ug via INTRAVENOUS
  Administered 2022-02-20: 50 ug via INTRAVENOUS
  Administered 2022-02-20: 100 ug via INTRAVENOUS

## 2022-02-20 MED ORDER — IOHEXOL 300 MG/ML  SOLN
INTRAMUSCULAR | Status: DC | PRN
  Administered 2022-02-20: 12 mL

## 2022-02-20 MED ORDER — MIDAZOLAM HCL 5 MG/5ML IJ SOLN
INTRAMUSCULAR | Status: DC | PRN
Start: 2022-02-20 — End: 2022-02-20
  Administered 2022-02-20: 2 mg via INTRAVENOUS

## 2022-02-20 MED ORDER — FENTANYL CITRATE (PF) 250 MCG/5ML IJ SOLN
INTRAMUSCULAR | Status: AC
  Filled 2022-02-20: qty 5

## 2022-02-20 MED ORDER — FENTANYL CITRATE PF 50 MCG/ML IJ SOSY: 25.0000 ug | PREFILLED_SYRINGE | INTRAMUSCULAR | Status: DC | PRN

## 2022-02-20 MED ORDER — LACTATED RINGERS IV SOLN: INTRAVENOUS | Status: DC

## 2022-02-20 MED ORDER — PROPOFOL 10 MG/ML IV BOLUS
INTRAVENOUS | Status: DC | PRN
  Administered 2022-02-20: 200 mg via INTRAVENOUS

## 2022-02-20 SURGICAL SUPPLY — 75 items
AGENT HMST KT MTR STRL THRMB (HEMOSTASIS)
APL PRP STRL LF DISP 70% ISPRP (MISCELLANEOUS) ×2
APL SKNCLS STERI-STRIP NONHPOA (GAUZE/BANDAGES/DRESSINGS) ×1
BAG COUNTER SPONGE SURGICOUNT (BAG) IMPLANT
BAG DRN RND TRDRP ANRFLXCHMBR (UROLOGICAL SUPPLIES)
BAG SPNG CNTER NS LX DISP (BAG)
BAG URINE DRAIN 2000ML AR STRL (UROLOGICAL SUPPLIES) IMPLANT
BAG URO CATCHER STRL LF (MISCELLANEOUS) IMPLANT
BASKET LASER NITINOL 1.9FR (BASKET) ×1 IMPLANT
BASKET ZERO TIP NITINOL 2.4FR (BASKET) IMPLANT
BENZOIN TINCTURE PRP APPL 2/3 (GAUZE/BANDAGES/DRESSINGS) ×2 IMPLANT
BLADE SURG 15 STRL LF DISP TIS (BLADE) ×1 IMPLANT
BLADE SURG 15 STRL SS (BLADE) ×2
BSKT STON RTRVL 120 1.9FR (BASKET) ×1
BSKT STON RTRVL ZERO TP 2.4FR (BASKET)
CATH FOLEY 2W COUNCIL 20FR 5CC (CATHETERS) IMPLANT
CATH FOLEY 2WAY SLVR  5CC 16FR (CATHETERS) ×2
CATH FOLEY 2WAY SLVR 5CC 16FR (CATHETERS) ×1 IMPLANT
CATH MULTI PURPOSE 16FR DRAIN (CATHETERS) IMPLANT
CATH ROBINSON RED A/P 20FR (CATHETERS) IMPLANT
CATH ULTRATHANE 14FR (CATHETERS) ×2 IMPLANT
CATH URETL OPEN END 6FR 70 (CATHETERS) IMPLANT
CATH UROLOGY TORQUE 40 (MISCELLANEOUS) ×1 IMPLANT
CATH X-FORCE N30 NEPHROSTOMY (TUBING) ×1 IMPLANT
CHLORAPREP W/TINT 26 (MISCELLANEOUS) ×4 IMPLANT
DRAPE C-ARM 42X120 X-RAY (DRAPES) ×2 IMPLANT
DRAPE LINGEMAN PERC (DRAPES) ×2 IMPLANT
DRAPE SHEET LG 3/4 BI-LAMINATE (DRAPES) IMPLANT
DRAPE SURG IRRIG POUCH 19X23 (DRAPES) ×2 IMPLANT
DRSG PAD ABDOMINAL 8X10 ST (GAUZE/BANDAGES/DRESSINGS) ×4 IMPLANT
DRSG TEGADERM 4X4.75 (GAUZE/BANDAGES/DRESSINGS) IMPLANT
DRSG TEGADERM 8X12 (GAUZE/BANDAGES/DRESSINGS) ×3 IMPLANT
GAUZE SPONGE 4X4 12PLY STRL (GAUZE/BANDAGES/DRESSINGS) ×2 IMPLANT
GLOVE SURG ENC TEXT LTX SZ7.5 (GLOVE) ×2 IMPLANT
GOWN STRL REUS W/TWL LRG LVL3 (GOWN DISPOSABLE) ×2 IMPLANT
GUIDEWIRE AMPLAZ .035X145 (WIRE) ×4 IMPLANT
GUIDEWIRE ANG ZIPWIRE 038X150 (WIRE) ×2 IMPLANT
GUIDEWIRE STR DUAL SENSOR (WIRE) IMPLANT
IV SET EXTENSION CATH 6 NF (IV SETS) IMPLANT
KIT BASIN OR (CUSTOM PROCEDURE TRAY) ×2 IMPLANT
KIT PROBE 340X3.4XDISP GRN (MISCELLANEOUS) IMPLANT
KIT PROBE TRILOGY 3.4X340 (MISCELLANEOUS)
KIT PROBE TRILOGY 3.9X350 (MISCELLANEOUS) ×1 IMPLANT
KIT TURNOVER KIT A (KITS) IMPLANT
LASER FIB FLEXIVA PULSE ID 365 (Laser) ×1 IMPLANT
LASER FIB FLEXIVA PULSE ID 550 (Laser) IMPLANT
LASER FIB FLEXIVA PULSE ID 910 (Laser) IMPLANT
MANIFOLD NEPTUNE II (INSTRUMENTS) ×2 IMPLANT
NDL TROCAR 18X15 ECHO (NEEDLE) IMPLANT
NDL TROCAR 18X20 (NEEDLE) IMPLANT
NEEDLE TROCAR 18X15 ECHO (NEEDLE) ×2 IMPLANT
NEEDLE TROCAR 18X20 (NEEDLE) IMPLANT
NS IRRIG 1000ML POUR BTL (IV SOLUTION) ×2 IMPLANT
PACK CYSTO (CUSTOM PROCEDURE TRAY) IMPLANT
SHEATH PEELAWAY SET 9 (SHEATH) IMPLANT
SHEATH X FORCE 10MMX22CM (SHEATH) ×1 IMPLANT
SPONGE T-LAP 4X18 ~~LOC~~+RFID (SPONGE) ×2 IMPLANT
SURGIFLO W/THROMBIN 8M KIT (HEMOSTASIS) IMPLANT
SUT SILK 2 0 30  PSL (SUTURE) ×2
SUT SILK 2 0 30 PSL (SUTURE) ×1 IMPLANT
SUT VIC AB 2-0 CT1 27 (SUTURE)
SUT VIC AB 2-0 CT1 TAPERPNT 27 (SUTURE) IMPLANT
SYR 10ML LL (SYRINGE) ×2 IMPLANT
SYR 20ML LL LF (SYRINGE) ×4 IMPLANT
SYR 50ML LL SCALE MARK (SYRINGE) ×2 IMPLANT
TOWEL OR 17X26 10 PK STRL BLUE (TOWEL DISPOSABLE) ×2 IMPLANT
TRACTIP FLEXIVA PULS ID 200XHI (Laser) IMPLANT
TRACTIP FLEXIVA PULSE ID 200 (Laser)
TRAY FOLEY MTR SLVR 16FR STAT (SET/KITS/TRAYS/PACK) ×2 IMPLANT
TUBE CONNECTING VINYL 14FR 30C (TUBING) ×1 IMPLANT
TUBING CONNECTING 10 (TUBING) ×4 IMPLANT
TUBING STONE CATCHER TRILOGY (MISCELLANEOUS) ×1 IMPLANT
TUBING UROLOGY SET (TUBING) IMPLANT
WATER STERILE IRR 1000ML POUR (IV SOLUTION) ×2 IMPLANT
WATER STERILE IRR 3000ML UROMA (IV SOLUTION) ×2 IMPLANT

## 2022-02-20 NOTE — Op Note (Signed)
[name], [name] MEDICAL RECORD NO: [id number] ACCOUNT NO: [id number] DATE OF BIRTH: [birth year] FACILITY: [facility] LOCATION: [id number] PHYSICIAN: Alexis Frock, MD  Operative Report   DATE OF PROCEDURE: 02/20/2022  PREOPERATIVE DIAGNOSIS:  Complete right staghorn kidney stone, status post first stage procedure.  POSTOPERATIVE DIAGNOSIS:  Complete right staghorn kidney stone, status post first stage procedure.  PROCEDURE:   1.  Right second stage percutaneous nephrostolithotomy, stone greater than 2 cm. 2.  Antegrade nephrostogram with interpretation. 3.  Exchange of right nephrostomy tube.  ESTIMATED BLOOD LOSS:  614 mL  COMPLICATIONS:  None.  SPECIMENS:  Right renal stone fragments for composition analysis.  FINDINGS:   1.  Estimated 3 to 4 cm residual stone, mostly upper pole and upper mid calices pre-second stage procedure. 2.  Ablation of estimated 50% to 60% of residual stone volume during second stage procedure. Some residual stone in the upper mid calix.  DRAINS.  1.  Right superior nephrostomy tube capped. 2.  Right inferior nephrostomy tube to gravity. 3.  Right nephroureteral stent capped. 4.  Foley catheter to straight drain.  INDICATIONS FOR PROCEDURE:  The patient is a very pleasant 61 year old lady with history of morbid obesity.  She was found on evaluation per recent weight loss surgery to have a complete right staghorn kidney stone.  She underwent first stage procedure  on 02/18/2022 at which point the majority of her lower pole renal pelvis stone was addressed and she had upper pole and lower pole access placed with the lower pole being dilated and used. She presents today for a second stage procedure, focusing more on  her upper renal stone.  Informed consent was obtained and placed in medical record.  PROCEDURE IN DETAIL:  The patient being herself verified, procedure being right second stage percutaneous nephrostolithotomy was confirmed.  Procedure  timeout was performed.  Intravenous antibiotics were administered.  General endotracheal anesthesia  induced.  The patient was then very carefully placed into a prone position, plane prone view, chest rolls, axillary rolls, sequential devices, padding of her knees and ankles.  She was very carefully further fastened to operating table using 3-inch tape  over foam padding across the buttocks and loosely across her chest as well as her calf.  The in situ right nephrostomy tube, nephroureteral stents were prepped with chlorhexidine gluconate and a percutaneous drape was applied.  Initially, antegrade  nephrostogram and spot fluoroscopy revealed excellent placement of the prior nephrostomy tubes and nephroureteral stents.  No evidence of extravasation with estimated 3 to 4 cm residual stone volume, mostly in the upper pole and in upper mid calix.  Her  extreme upper pole was quite high.  It was felt that access directly on this would have high risk of pleural injury.  Therefore, our plan today was to address this via flexible endoscopy via her lower access and the sensor wire was advanced to the level  of proximal ureter through the lower pole nephrostomy tube. It was then exchanged for a 30-French NephroMax balloon dilation apparatus. It was carefully advanced across the lower pole calix, inflated to 20 atmospheres, held for 90 seconds and the sheath  was advanced.  Flexible nephroscopy revealed excellent placement of the sheath in lower pole calix.  Again, there was excellent resolution of her lower pole and renal pelvis of stone burden.  Via flexible endoscopy technique, there were two dominant foci  of stones, one in the extreme upper pole that was very large and another in upper mid calix.  The upper mid area was the site of prior upper access.  We then focused this plan on the upper pole renal stone using holmium laser energy setting of 0.3  joules in 30 Hz and approximately 50% of the stone dusted, 50%  fragmented with fragments then being withdrawn via the Escape basket to the area renal pelvis in batches and then rigid nephroscopy with the Trilogy device used to aspirate and ablate those  from the area of the renal pelvis.  This was performed many times removing the vast majority of the stone fragments from the extreme lower pole.  I was quite happy with the resolution of the obstructing stone via this technique. There was another very  large, dominant focus of stone in the upper mid calix that actually was quite acutely angled to the lower access, but had already been accessed with the first stage procedure and a nephroureteral stent crossing this and visualization using flexible  nephroscopy it was felt that the most prudent means of management of this big of a stone will be to dilate this tract.  A such, the Super Stiff wire was advanced via the upper nephroureteral stent to the level of the mid ureter. Nephroureteral stent was  removed.  Incision was made in the skin.  30-French balloon dilation apparatus was advanced across this, inflated to a pressure of 20 atmospheres, held for 90 seconds. Sheath was carefully advanced. Rigid nephroscopy revealed excellent placement of the  sheath into the upper mid calix in direct apposition of stone and using Trilogy dual ultrasound pneumatic device, the vast majority of this upper mid stone burden was addressed. Following this, the patient had been in prone position for nearly 3 hours.  We did  remove the significant majority of the remaining stone; however, there was clearly some residual stone in acutely angled calices that would require a third stage, which I do feel was prudent.. As such, a 14-French nephrostomy tube was advanced over the upper Super Stiff working wire to the level of the renal pelvis, coiled in place and the upper sheath removed.  This was  capped, nephrostomy tube was sutured in place with interrupted silk.  Similarly, a 14-French  nephrostomy tube was placed via the lower access coiled in the level of renal pelvis. Lower sheath removed.  Nephrostomy tube was sutured in place, this was  connected to straight drain. During this entire time, the patient did have a nephroureteral stent to be in the lower access, which remained undisturbed. Intermittent catheterization at conclusion of the procedure today. Percutaneous dressing was applied.   Procedure was terminated.  The patient tolerated the procedure well.  No immediate complications.  The patient was taken to [facility] in stable condition.  Plan for continued inpatient admission and likely third stage procedure in several days pending her goals.   PAA D: 02/20/2022 5:10:52 pm T: 02/20/2022 11:25:00 pm  JOB: [id number]/ [id number]

## 2022-02-20 NOTE — Anesthesia Preprocedure Evaluation (Signed)
Anesthesia Evaluation  Patient identified by MRN, date of birth, ID band Patient awake    Reviewed: Allergy & Precautions, NPO status , Patient's Chart, lab work & pertinent test results  Airway Mallampati: III  TM Distance: >3 FB Neck ROM: Full    Dental  (+) Teeth Intact, Dental Advisory Given   Pulmonary neg pulmonary ROS,    Pulmonary exam normal        Cardiovascular hypertension, Pt. on medications  Rhythm:Regular Rate:Normal     Neuro/Psych negative neurological ROS  negative psych ROS   GI/Hepatic negative GI ROS, Neg liver ROS,   Endo/Other  Morbid obesity  Renal/GU Renal diseaseRight staghorn calculi  negative genitourinary   Musculoskeletal  (+) Arthritis , Osteoarthritis,    Abdominal (+) + obese,   Peds  Hematology  (+) Blood dyscrasia, Sickle cell trait and anemia ,   Anesthesia Other Findings   Reproductive/Obstetrics                            Anesthesia Physical  Anesthesia Plan  ASA: 3  Anesthesia Plan: General   Post-op Pain Management: Tylenol PO (pre-op)* and Toradol IV (intra-op)*   Induction: Intravenous  PONV Risk Score and Plan: 3 and Ondansetron, Dexamethasone, Midazolam and Treatment may vary due to age or medical condition  Airway Management Planned: Oral ETT  Additional Equipment: None  Intra-op Plan:   Post-operative Plan: Extubation in OR  Informed Consent: I have reviewed the patients History and Physical, chart, labs and discussed the procedure including the risks, benefits and alternatives for the proposed anesthesia with the patient or authorized representative who has indicated his/her understanding and acceptance.     Dental advisory given  Plan Discussed with:   Anesthesia Plan Comments:         Anesthesia Quick Evaluation

## 2022-02-20 NOTE — Anesthesia Procedure Notes (Signed)
Date/Time: 02/20/2022 5:12 PM Performed by: Cynda Familia, CRNA Oxygen Delivery Method: Simple face mask Placement Confirmation: positive ETCO2 and breath sounds checked- equal and bilateral Dental Injury: Teeth and Oropharynx as per pre-operative assessment

## 2022-02-20 NOTE — Anesthesia Postprocedure Evaluation (Signed)
Anesthesia Post Note  Patient: [name]  Procedure(s) Performed: SECOND STAGENEPHROLITHOTOMY PERCUTANEOUS, NEPHROSTOMY TUBE CHANGE (Right) HOLMIUM LASER APPLICATION (Right)     Patient location during evaluation: PACU Anesthesia Type: General Level of consciousness: awake and alert Pain management: pain level controlled Vital Signs Assessment: post-procedure vital signs reviewed and stable Respiratory status: spontaneous breathing, nonlabored ventilation and respiratory function stable Cardiovascular status: blood pressure returned to baseline and stable Postop Assessment: no apparent nausea or vomiting Anesthetic complications: no   No notable events documented.  Last Vitals:  Vitals:   02/20/22 1800 02/20/22 1830  BP: (!) 161/96 (!) 156/98  Pulse: 73 74  Resp: 12 11  Temp:    SpO2: 92% 93%    Last Pain:  Vitals:   02/20/22 1830  TempSrc:   PainSc: 0-No pain                 Lidia Collum

## 2022-02-20 NOTE — Transfer of Care (Signed)
Immediate Anesthesia Transfer of Care Note  Patient: [name]  Procedure(s) Performed: SECOND STAGENEPHROLITHOTOMY PERCUTANEOUS, NEPHROSTOMY TUBE CHANGE (Right) HOLMIUM LASER APPLICATION (Right)  Patient Location: PACU  Anesthesia Type:General  Level of Consciousness: sedated  Airway & Oxygen Therapy: Patient Spontanous Breathing and Patient connected to face mask oxygen  Post-op Assessment: Report given to RN and Post -op Vital signs reviewed and stable  Post vital signs: Reviewed and stable  Last Vitals:  Vitals Value Taken Time  BP 172/97 02/20/22 1727  Temp    Pulse 78 02/20/22 1729  Resp 19 02/20/22 1725  SpO2 92 % 02/20/22 1729  Vitals shown include unvalidated device data.  Last Pain:  Vitals:   02/20/22 1214  TempSrc:   PainSc: 3       Patients Stated Pain Goal: 2 (93/71/69 6789)  Complications: No notable events documented.

## 2022-02-20 NOTE — Brief Op Note (Signed)
02/20/2022  5:00 PM  PATIENT:  [name]  61 y.o. female  PRE-OPERATIVE DIAGNOSIS:  RIGHT COMPLETE STAGHORN KIDNEY STONE  POST-OPERATIVE DIAGNOSIS:  RIGHT COMPLETE STAGHORN KIDNEY STONE  PROCEDURE:  Procedure(s) with comments: SECOND STAGENEPHROLITHOTOMY PERCUTANEOUS, NEPHROSTOMY TUBE CHANGE (Right) - 3 HRS HOLMIUM LASER APPLICATION (Right)  SURGEON:  Surgeon(s) and Role:    Alexis Frock, MD - Primary  PHYSICIAN ASSISTANT:   ASSISTANTS: none   ANESTHESIA:   general  EBL:  300 mL   BLOOD ADMINISTERED:none  DRAINS:  Rt upper nephrostomy (capped), Rt lower nerphrostomy (drain), Foley (drain), Rt nephroureteral stent (capped)    LOCAL MEDICATIONS USED:  NONE  SPECIMEN:  Source of Specimen:  Rt renal stone fragments  DISPOSITION OF SPECIMEN:  [facility] for compositional analysis  COUNTS:  YES  TOURNIQUET:  * No tourniquets in log *  DICTATION: .Other Dictation: Dictation Number [id number]  PLAN OF CARE: Admit to inpatient   PATIENT DISPOSITION:  PACU - hemodynamically stable.   Delay start of Pharmacological VTE agent (>24hrs) due to surgical blood loss or risk of bleeding: yes

## 2022-02-20 NOTE — Progress Notes (Signed)
Day of Surgery   Subjective/Chief Complaint:   1 - RIGHT Staghorn Kidney Stone - s/p RIGHT 1st stage percutaneous nephrostolithotomy + renal access 02/18/2022. Majority of lower pole and renal pelvis component of stone adressed.   Today "[name]" is ready to proceed with 2nd stage RIGHT PCNL. No fevers.    Objective: Vital signs in last 24 hours: Temp:  [97.8 F (36.6 C)-98.2 F (36.8 C)] 98 F (36.7 C) (02/24 0445) Pulse Rate:  [56-67] 60 (02/24 0445) Resp:  [16-20] 19 (02/24 0445) BP: (100-142)/(52-75) 110/75 (02/24 0445) SpO2:  [87 %-96 %] 96 % (02/24 0445) Last BM Date : 02/17/22  Intake/Output from previous day: 02/23 0701 - 02/24 0700 In: -  Out: 2000 [Urine:2000] Intake/Output this shift: No intake/output data recorded.  EXAM: NAD, Stable obesity Non-labored breathing on minimal [place] O2 RRR Rt neph tube with pink urine, nephroureteral stents capped x2 Foley in place with pink urine that is non-foul No c/c/e.  Lab Results:  Recent Labs    02/18/22 1159 02/19/22 0502  HGB 12.4 10.8*  HCT 39.2 33.9*   BMET Recent Labs    02/19/22 0502  NA 134*  K 4.0  CL 105  CO2 25  GLUCOSE 112*  BUN 14  CREATININE 1.06*  CALCIUM 9.0   PT/INR No results for input(s): LABPROT, INR in the last 72 hours. ABG No results for input(s): PHART, HCO3 in the last 72 hours.  Invalid input(s): PCO2, PO2  Studies/Results: DG C-Arm 1-60 Min-No Report  Result Date: 02/18/2022 Fluoroscopy was utilized by the requesting physician.  No radiographic interpretation.   DG C-Arm 1-60 Min-No Report  Result Date: 02/18/2022 Fluoroscopy was utilized by the requesting physician.  No radiographic interpretation.   DG C-Arm 1-60 Min-No Report  Result Date: 02/18/2022 Fluoroscopy was utilized by the requesting physician.  No radiographic interpretation.    Anti-infectives: Anti-infectives (From admission, onward)    Start     Dose/Rate Route Frequency Ordered Stop   02/18/22 0615   gentamicin (GARAMYCIN) 450 mg in dextrose 5 % 100 mL IVPB        5 mg/kg  89.8 kg (Adjusted) 111.3 mL/hr over 60 Minutes Intravenous 30 min pre-op 02/18/22 0615 02/18/22 0838       Assessment/Plan:  Proceed as planned with RIGHT 2nd stage PCNL. Risks, benefits, alternatives, expected peri-op course, need for possible additional stages discussed previously and reiterated today.    Alexis Frock 02/20/2022

## 2022-02-20 NOTE — Progress Notes (Addendum)
PHARMACY COMMUNICATION NOTE  [name] is a 61 year old female with a right staghorn kidney stone s/p 1st stage right percutaneous nephrostolithotomy and renal access on 02/18/22.  Planning for 2nd stage nephrolithotomy 2/24.  Pharmacy consulted to dose pre-op gentamicin for surgery.  Plan: -Gentamicin 450mg  IV x1 (5mg /kg using ABW) extended infusion for CrCl > 30 mL/min  Dimple Nanas, PharmD 02/20/2022 9:33 AM

## 2022-02-20 NOTE — Anesthesia Procedure Notes (Signed)
Procedure Name: Intubation Date/Time: 02/20/2022 2:07 PM Performed by: Lavina Hamman, CRNA Pre-anesthesia Checklist: Patient identified, Emergency Drugs available, Suction available, Patient being monitored and Timeout performed Patient Re-evaluated:Patient Re-evaluated prior to induction Oxygen Delivery Method: Circle system utilized Preoxygenation: Pre-oxygenation with 100% oxygen Induction Type: IV induction Ventilation: Mask ventilation without difficulty Laryngoscope Size: Mac and 3 Grade View: Grade I Tube type: Oral Tube size: 7.0 mm Number of attempts: 1 Airway Equipment and Method: Stylet Placement Confirmation: ETT inserted through vocal cords under direct vision, positive ETCO2, CO2 detector and breath sounds checked- equal and bilateral Secured at: 22 cm Tube secured with: Tape Dental Injury: Teeth and Oropharynx as per pre-operative assessment  Comments: ATOI

## 2022-02-21 ENCOUNTER — Encounter (HOSPITAL_COMMUNITY): Payer: Self-pay | Admitting: Urology

## 2022-02-21 LAB — BASIC METABOLIC PANEL
Anion gap: 7 (ref 5–15)
BUN: 10 mg/dL (ref 6–20)
CO2: 25 mmol/L (ref 22–32)
Calcium: 9 mg/dL (ref 8.9–10.3)
Chloride: 99 mmol/L (ref 98–111)
Creatinine, Ser: 0.87 mg/dL (ref 0.44–1.00)
GFR, Estimated: >60 mL/min (ref >60)
Glucose, Bld: 105 mg/dL — ABNORMAL HIGH (ref 70–99)
Potassium: 3.7 mmol/L (ref 3.5–5.1)
Sodium: 131 mmol/L — ABNORMAL LOW (ref 135–145)

## 2022-02-21 LAB — HEMOGLOBIN AND HEMATOCRIT, BLOOD
HCT: 32.1 % — ABNORMAL LOW (ref 36.0–46.0)
Hemoglobin: 10.3 g/dL — ABNORMAL LOW (ref 12.0–15.0)

## 2022-02-21 NOTE — Progress Notes (Signed)
1 Day Post-Op   Subjective/Chief Complaint:   1 - RIGHT Staghorn Kidney Stone - s/p RIGHT 1st and 2nd stage percutaneous nephrostolithotomy + renal access 02/18/2022 and 02/20/2022. Majority of lower pole and renal pelvis component of stone adressed.   Today her pain is well controlled. She is requesting foley removal. She is tolerating her diet.   Objective: Vital signs in last 24 hours: Temp:  [98.1 F (36.7 C)-99 F (37.2 C)] 98.4 F (36.9 C) (02/25 1209) Pulse Rate:  [67-104] 97 (02/25 1215) Resp:  [11-20] 14 (02/25 0734) BP: (134-181)/(70-98) 134/75 (02/25 1215) SpO2:  [92 %-97 %] 95 % (02/25 1215) Last BM Date : 02/17/22  Intake/Output from previous day: 02/24 0701 - 02/25 0700 In: 1300 [I.V.:1300] Out: 2100 [Urine:1800; Blood:300] Intake/Output this shift: Total I/O In: -  Out: 650 [Urine:650]  EXAM: NAD, Stable obesity Non-labored breathing on minimal [place] O2 RRR Rt neph tube with pink urine, nephroureteral stents capped x2 Foley in place with pink urine that is non-foul No c/c/e.  Lab Results:  Recent Labs    02/20/22 1850 02/21/22 0740  HGB 12.2 10.3*  HCT 38.4 32.1*   BMET Recent Labs    02/19/22 0502 02/20/22 1652 02/21/22 0740  NA 134* 136 131*  K 4.0 4.6 3.7  CL 105  --  99  CO2 25  --  25  GLUCOSE 112*  --  105*  BUN 14  --  10  CREATININE 1.06*  --  0.87  CALCIUM 9.0  --  9.0   PT/INR No results for input(s): LABPROT, INR in the last 72 hours. ABG Recent Labs    02/20/22 1652  HCO3 27.5    Studies/Results: DG C-Arm 1-60 Min-No Report  Result Date: 02/20/2022 Fluoroscopy was utilized by the requesting physician.  No radiographic interpretation.   DG C-Arm 1-60 Min-No Report  Result Date: 02/20/2022 Fluoroscopy was utilized by the requesting physician.  No radiographic interpretation.   DG C-Arm 1-60 Min-No Report  Result Date: 02/20/2022 Fluoroscopy was utilized by the requesting physician.  No radiographic interpretation.    DG C-Arm 1-60 Min-No Report  Result Date: 02/20/2022 Fluoroscopy was utilized by the requesting physician.  No radiographic interpretation.    Anti-infectives: Anti-infectives (From admission, onward)    Start     Dose/Rate Route Frequency Ordered Stop   02/20/22 1100  gentamicin (GARAMYCIN) 450 mg in dextrose 5 % 100 mL IVPB        5 mg/kg  89.8 kg (Adjusted) 111.3 mL/hr over 60 Minutes Intravenous 30 min pre-op 02/20/22 0738 02/20/22 1455   02/18/22 0615  gentamicin (GARAMYCIN) 450 mg in dextrose 5 % 100 mL IVPB        5 mg/kg  89.8 kg (Adjusted) 111.3 mL/hr over 60 Minutes Intravenous 30 min pre-op 02/18/22 0615 02/18/22 0938       Assessment/Plan:  Proceed as planned with RIGHT 3rd stage PCNL Monday. Continue current pain control regiment. D/c foley today   Nicolette Bang 02/21/2022

## 2022-02-22 MED ORDER — ALUM & MAG HYDROXIDE-SIMETH 200-200-20 MG/5ML PO SUSP
30.0000 mL | Freq: Four times a day (QID) | ORAL | Status: DC | PRN
  Administered 2022-02-22 – 2022-02-24 (×5): 30 mL via ORAL
  Filled 2022-02-22 (×6): qty 30

## 2022-02-22 MED ORDER — ACETAMINOPHEN 325 MG PO TABS
650.0000 mg | ORAL_TABLET | Freq: Four times a day (QID) | ORAL | Status: DC | PRN
  Administered 2022-02-22: 650 mg via ORAL
  Filled 2022-02-22: qty 2

## 2022-02-22 NOTE — Progress Notes (Signed)
2 Days Post-Op   Subjective/Chief Complaint:   1 - RIGHT Staghorn Kidney Stone - s/p RIGHT 1st and 2nd stage percutaneous nephrostolithotomy + renal access 02/18/2022 and 02/20/2022. Majority of lower pole and renal pelvis component of stone adressed.   Today her pain is well controlled. She is tolerating her diet. She has intermittent indigestion.    Objective: Vital signs in last 24 hours: Temp:  [98.8 F (37.1 C)-99.5 F (37.5 C)] 98.8 F (37.1 C) (02/26 1313) Pulse Rate:  [83-105] 83 (02/26 1313) Resp:  [14-18] 14 (02/26 1313) BP: (111-142)/(66-85) 142/85 (02/26 1313) SpO2:  [94 %-96 %] 94 % (02/26 1313) Last BM Date : 02/17/22  Intake/Output from previous day: 02/25 0701 - 02/26 0700 In: 240 [P.O.:240] Out: 1575 [Urine:1575] Intake/Output this shift: Total I/O In: 4953 [P.O.:390; I.V.:4563] Out: -   EXAM: NAD, Stable obesity Non-labored breathing on minimal [place] O2 RRR Rt neph tube with pink urine, nephroureteral stents capped x2 Foley in place with pink urine that is non-foul No c/c/e.  Lab Results:  Recent Labs    02/20/22 1850 02/21/22 0740  HGB 12.2 10.3*  HCT 38.4 32.1*   BMET Recent Labs    02/20/22 1652 02/21/22 0740  NA 136 131*  K 4.6 3.7  CL  --  99  CO2  --  25  GLUCOSE  --  105*  BUN  --  10  CREATININE  --  0.87  CALCIUM  --  9.0   PT/INR No results for input(s): LABPROT, INR in the last 72 hours. ABG Recent Labs    02/20/22 1652  HCO3 27.5    Studies/Results: DG C-Arm 1-60 Min-No Report  Result Date: 02/20/2022 Fluoroscopy was utilized by the requesting physician.  No radiographic interpretation.   DG C-Arm 1-60 Min-No Report  Result Date: 02/20/2022 Fluoroscopy was utilized by the requesting physician.  No radiographic interpretation.   DG C-Arm 1-60 Min-No Report  Result Date: 02/20/2022 Fluoroscopy was utilized by the requesting physician.  No radiographic interpretation.   DG C-Arm 1-60 Min-No Report  Result Date:  02/20/2022 Fluoroscopy was utilized by the requesting physician.  No radiographic interpretation.    Anti-infectives: Anti-infectives (From admission, onward)    Start     Dose/Rate Route Frequency Ordered Stop   02/20/22 1100  gentamicin (GARAMYCIN) 450 mg in dextrose 5 % 100 mL IVPB        5 mg/kg  89.8 kg (Adjusted) 111.3 mL/hr over 60 Minutes Intravenous 30 min pre-op 02/20/22 0738 02/20/22 1455   02/18/22 0615  gentamicin (GARAMYCIN) 450 mg in dextrose 5 % 100 mL IVPB        5 mg/kg  89.8 kg (Adjusted) 111.3 mL/hr over 60 Minutes Intravenous 30 min pre-op 02/18/22 0615 02/18/22 7001       Assessment/Plan:  Proceed as planned with RIGHT 3rd stage PCNL Monday. Continue current pain control regiment.    Nicolette Bang 02/22/2022

## 2022-02-23 ENCOUNTER — Inpatient Hospital Stay (HOSPITAL_COMMUNITY): Payer: Commercial Managed Care - PPO | Admitting: Anesthesiology

## 2022-02-23 ENCOUNTER — Inpatient Hospital Stay (HOSPITAL_COMMUNITY): Payer: Commercial Managed Care - PPO

## 2022-02-23 ENCOUNTER — Encounter (HOSPITAL_COMMUNITY)
Admission: RE | Disposition: A | Discharge status: Home / Self Care | Payer: Self-pay | Source: Home / Self Care | Attending: Urology

## 2022-02-23 DIAGNOSIS — D573 Sickle-cell trait: Secondary | ICD-10-CM

## 2022-02-23 DIAGNOSIS — I10 Essential (primary) hypertension: Secondary | ICD-10-CM

## 2022-02-23 DIAGNOSIS — M1990 Unspecified osteoarthritis, unspecified site: Secondary | ICD-10-CM

## 2022-02-23 DIAGNOSIS — N200 Calculus of kidney: Secondary | ICD-10-CM

## 2022-02-23 HISTORY — PX: NEPHROLITHOTOMY: SHX5134

## 2022-02-23 SURGERY — NEPHROLITHOTOMY PERCUTANEOUS SECOND LOOK
Anesthesia: General | Site: Back

## 2022-02-23 MED ORDER — FENTANYL CITRATE (PF) 100 MCG/2ML IJ SOLN
INTRAMUSCULAR | Status: AC
  Filled 2022-02-23: qty 2

## 2022-02-23 MED ORDER — IOHEXOL 300 MG/ML  SOLN
INTRAMUSCULAR | Status: DC | PRN
  Administered 2022-02-23: 65 mL

## 2022-02-23 MED ORDER — LIDOCAINE 2% (20 MG/ML) 5 ML SYRINGE
INTRAMUSCULAR | Status: DC | PRN
Start: 2022-02-23 — End: 2022-02-23
  Administered 2022-02-23: 100 mg via INTRAVENOUS

## 2022-02-23 MED ORDER — SODIUM CHLORIDE 0.9 % IR SOLN
Status: DC | PRN
  Administered 2022-02-23: 1000 mL

## 2022-02-23 MED ORDER — PHENYLEPHRINE 40 MCG/ML (10ML) SYRINGE FOR IV PUSH (FOR BLOOD PRESSURE SUPPORT)
PREFILLED_SYRINGE | INTRAVENOUS | Status: DC | PRN
  Administered 2022-02-23 (×2): 120 ug via INTRAVENOUS
  Administered 2022-02-23: 80 ug via INTRAVENOUS

## 2022-02-23 MED ORDER — HYDROMORPHONE HCL 1 MG/ML IJ SOLN: 0.2500 mg | INTRAMUSCULAR | Status: DC | PRN

## 2022-02-23 MED ORDER — SUGAMMADEX SODIUM 200 MG/2ML IV SOLN
INTRAVENOUS | Status: DC | PRN
Start: 2022-02-23 — End: 2022-02-23
  Administered 2022-02-23: 400 mg via INTRAVENOUS

## 2022-02-23 MED ORDER — DEXAMETHASONE SODIUM PHOSPHATE 10 MG/ML IJ SOLN
INTRAMUSCULAR | Status: DC | PRN
  Administered 2022-02-23: 5 mg via INTRAVENOUS

## 2022-02-23 MED ORDER — PHENYLEPHRINE HCL-NACL 20-0.9 MG/250ML-% IV SOLN: INTRAVENOUS | Status: DC | PRN

## 2022-02-23 MED ORDER — ACETAMINOPHEN 500 MG PO TABS
ORAL_TABLET | ORAL | Status: AC
  Filled 2022-02-23: qty 2

## 2022-02-23 MED ORDER — ONDANSETRON HCL 4 MG/2ML IJ SOLN: 4.0000 mg | Freq: Once | INTRAMUSCULAR | Status: DC | PRN

## 2022-02-23 MED ORDER — AMISULPRIDE (ANTIEMETIC) 5 MG/2ML IV SOLN: 10.0000 mg | Freq: Once | INTRAVENOUS | Status: DC | PRN

## 2022-02-23 MED ORDER — HYDROCODONE-ACETAMINOPHEN 7.5-325 MG PO TABS: 1.0000 | ORAL_TABLET | Freq: Once | ORAL | Status: DC | PRN

## 2022-02-23 MED ORDER — ACETAMINOPHEN 500 MG PO TABS
1000.0000 mg | ORAL_TABLET | Freq: Once | ORAL | Status: AC
  Administered 2022-02-23: 1000 mg via ORAL

## 2022-02-23 MED ORDER — SODIUM CHLORIDE 0.9 % IR SOLN
Status: DC | PRN
  Administered 2022-02-23 (×2): 3000 mL

## 2022-02-23 MED ORDER — LACTATED RINGERS IV SOLN: INTRAVENOUS | Status: DC | PRN

## 2022-02-23 MED ORDER — GENTAMICIN SULFATE 40 MG/ML IJ SOLN
5.0000 mg/kg | INTRAVENOUS | Status: AC
  Administered 2022-02-23: 460 mg via INTRAVENOUS
  Filled 2022-02-23: qty 11.5

## 2022-02-23 MED ORDER — ONDANSETRON HCL 4 MG/2ML IJ SOLN
INTRAMUSCULAR | Status: DC | PRN
  Administered 2022-02-23: 4 mg via INTRAVENOUS

## 2022-02-23 MED ORDER — FENTANYL CITRATE (PF) 100 MCG/2ML IJ SOLN
INTRAMUSCULAR | Status: DC | PRN
  Administered 2022-02-23: 100 ug via INTRAVENOUS

## 2022-02-23 MED ORDER — SUCCINYLCHOLINE CHLORIDE 200 MG/10ML IV SOSY
PREFILLED_SYRINGE | INTRAVENOUS | Status: DC | PRN
  Administered 2022-02-23: 200 mg via INTRAVENOUS

## 2022-02-23 MED ORDER — ROCURONIUM BROMIDE 10 MG/ML (PF) SYRINGE
PREFILLED_SYRINGE | INTRAVENOUS | Status: DC | PRN
  Administered 2022-02-23: 50 mg via INTRAVENOUS
  Administered 2022-02-23: 20 mg via INTRAVENOUS

## 2022-02-23 MED ORDER — ACETAMINOPHEN 500 MG PO TABS: 1000.0000 mg | ORAL_TABLET | Freq: Once | ORAL | Status: DC

## 2022-02-23 MED ORDER — PROPOFOL 10 MG/ML IV BOLUS
INTRAVENOUS | Status: DC | PRN
  Administered 2022-02-23: 200 mg via INTRAVENOUS

## 2022-02-23 SURGICAL SUPPLY — 51 items
AGENT HMST KT MTR STRL THRMB (HEMOSTASIS)
APL SKNCLS STERI-STRIP NONHPOA (GAUZE/BANDAGES/DRESSINGS) ×4
BAG COUNTER SPONGE SURGICOUNT (BAG) IMPLANT
BAG DRN RND TRDRP ANRFLXCHMBR (UROLOGICAL SUPPLIES) ×1
BAG SPNG CNTER NS LX DISP (BAG)
BAG URINE DRAIN 2000ML AR STRL (UROLOGICAL SUPPLIES) ×2 IMPLANT
BASKET LASER NITINOL 1.9FR (BASKET) ×1 IMPLANT
BASKET ZERO TIP NITINOL 2.4FR (BASKET) IMPLANT
BENZOIN TINCTURE PRP APPL 2/3 (GAUZE/BANDAGES/DRESSINGS) ×8 IMPLANT
BLADE SURG 15 STRL LF DISP TIS (BLADE) ×1 IMPLANT
BLADE SURG 15 STRL SS (BLADE) ×2
BSKT STON RTRVL 120 1.9FR (BASKET) ×1
BSKT STON RTRVL ZERO TP 2.4FR (BASKET)
CATH FOLEY 2W COUNCIL 20FR 5CC (CATHETERS) IMPLANT
CATH ROBINSON RED A/P 20FR (CATHETERS) IMPLANT
CATH X-FORCE N30 NEPHROSTOMY (TUBING) ×2 IMPLANT
DRAPE C-ARM 42X120 X-RAY (DRAPES) ×2 IMPLANT
DRAPE LINGEMAN PERC (DRAPES) ×2 IMPLANT
DRAPE SURG IRRIG POUCH 19X23 (DRAPES) ×2 IMPLANT
DRSG PAD ABDOMINAL 8X10 ST (GAUZE/BANDAGES/DRESSINGS) ×4 IMPLANT
DRSG TEGADERM 4X4.75 (GAUZE/BANDAGES/DRESSINGS) ×1 IMPLANT
DRSG TEGADERM 8X12 (GAUZE/BANDAGES/DRESSINGS) ×4 IMPLANT
GAUZE SPONGE 4X4 12PLY STRL (GAUZE/BANDAGES/DRESSINGS) ×2 IMPLANT
GLOVE SURG ENC TEXT LTX SZ7.5 (GLOVE) ×2 IMPLANT
GOWN STRL REUS W/TWL LRG LVL3 (GOWN DISPOSABLE) ×4 IMPLANT
GUIDEWIRE ANG ZIPWIRE 035X150 (WIRE) ×1 IMPLANT
GUIDEWIRE ANG ZIPWIRE 038X150 (WIRE) ×2 IMPLANT
GUIDEWIRE STR DUAL SENSOR (WIRE) ×2 IMPLANT
KIT BASIN OR (CUSTOM PROCEDURE TRAY) ×2 IMPLANT
KIT PROBE 340X3.4XDISP GRN (MISCELLANEOUS) IMPLANT
KIT PROBE TRILOGY 3.4X340 (MISCELLANEOUS)
KIT PROBE TRILOGY 3.9X350 (MISCELLANEOUS) IMPLANT
LASER FIB FLEXIVA PULSE ID 550 (Laser) IMPLANT
LASER FIB FLEXIVA PULSE ID 910 (Laser) IMPLANT
MANIFOLD NEPTUNE II (INSTRUMENTS) ×2 IMPLANT
PACK BASIC VI WITH GOWN DISP (CUSTOM PROCEDURE TRAY) ×2 IMPLANT
PENCIL SMOKE EVACUATOR (MISCELLANEOUS) IMPLANT
SPONGE T-LAP 4X18 ~~LOC~~+RFID (SPONGE) ×2 IMPLANT
STENT URET 6FRX24 CONTOUR (STENTS) ×1 IMPLANT
SURGIFLO W/THROMBIN 8M KIT (HEMOSTASIS) IMPLANT
SUT SILK 2 0 30  PSL (SUTURE)
SUT SILK 2 0 30 PSL (SUTURE) IMPLANT
SUT VIC AB 0 CT2 27 (SUTURE) IMPLANT
SUT VIC AB 2-0 SH 27 (SUTURE) ×4
SUT VIC AB 2-0 SH 27XBRD (SUTURE) IMPLANT
SYR 10ML LL (SYRINGE) ×2 IMPLANT
SYR 20ML LL LF (SYRINGE) ×4 IMPLANT
TOWEL OR NON WOVEN STRL DISP B (DISPOSABLE) ×2 IMPLANT
TRAY FOLEY MTR SLVR 14FR STAT (SET/KITS/TRAYS/PACK) ×1 IMPLANT
TUBING CONNECTING 10 (TUBING) ×6 IMPLANT
TUBING UROLOGY SET (TUBING) ×2 IMPLANT

## 2022-02-23 NOTE — Anesthesia Procedure Notes (Signed)
Procedure Name: Intubation Date/Time: 02/23/2022 5:56 PM Performed by: Milford Cage, CRNA Pre-anesthesia Checklist: Patient identified, Emergency Drugs available, Suction available and Patient being monitored Patient Re-evaluated:Patient Re-evaluated prior to induction Oxygen Delivery Method: Circle system utilized Preoxygenation: Pre-oxygenation with 100% oxygen Induction Type: IV induction and Rapid sequence Laryngoscope Size: Miller and 2 Grade View: Grade I Tube type: Oral Tube size: 7.0 mm Number of attempts: 1 Airway Equipment and Method: Stylet Placement Confirmation: ETT inserted through vocal cords under direct vision, positive ETCO2 and breath sounds checked- equal and bilateral Secured at: 22 cm Tube secured with: Tape Dental Injury: Teeth and Oropharynx as per pre-operative assessment

## 2022-02-23 NOTE — Progress Notes (Signed)
Day of Surgery   Subjective/Chief Complaint:  1 - RIGHT Staghorn Kidney Stone - s/p RIGHT 1st stage percutaneous nephrostolithotomy + renal access 02/18/2022 ( Majority of lower pole and renal pelvis component of stone adressed), 2nd stage 02/20/2022 (majority upper pole adressed and dilate upper / mid tract).   Today "Anette" is ready to proceed with 3rd stage RIGHT PCNL. No high grade fevers. Hgb 10.    Objective: Vital signs in last 24 hours: Temp:  [98 F (36.7 C)-99.9 F (37.7 C)] 99.9 F (37.7 C) (02/27 1619) Pulse Rate:  [72-95] 73 (02/27 1619) Resp:  [14-20] 14 (02/27 1619) BP: (141-158)/(74-82) 147/82 (02/27 1619) SpO2:  [92 %-97 %] 92 % (02/27 1619) Last BM Date : 02/18/22  Intake/Output from previous day: 02/26 0701 - 02/27 0700 In: 5927.1 [P.O.:390; I.V.:5537.1] Out: 675 [Urine:675] Intake/Output this shift: Total I/O In: 240 [P.O.:240] Out: 300 [Urine:300]  EXAM: NAD, Stable obesity Non-labored breathing on minimal [place] O2 RRR Rt neph tube with pink urine (lower), Rt neph tube upper (capped), Lower nephroureteral stent capped x2 Foley in place with pink urine that is non-foul No c/c/e.  Lab Results:  Recent Labs    02/20/22 1850 02/21/22 0740  HGB 12.2 10.3*  HCT 38.4 32.1*   BMET Recent Labs    02/20/22 1652 02/21/22 0740  NA 136 131*  K 4.6 3.7  CL  --  99  CO2  --  25  GLUCOSE  --  105*  BUN  --  10  CREATININE  --  0.87  CALCIUM  --  9.0   PT/INR No results for input(s): LABPROT, INR in the last 72 hours. ABG Recent Labs    02/20/22 1652  HCO3 27.5    Studies/Results: No results found.  Anti-infectives: Anti-infectives (From admission, onward)    Start     Dose/Rate Route Frequency Ordered Stop   02/23/22 1600  [MAR Hold]  gentamicin (GARAMYCIN) 460 mg in dextrose 5 % 100 mL IVPB        (MAR Hold since Mon 02/23/2022 at 1553.Hold Reason: Transfer to a Procedural area)   5 mg/kg  91.6 kg (Adjusted) 111.5 mL/hr over 60 Minutes  Intravenous On call to O.R. 02/23/22 1229 02/24/22 0559   02/20/22 1100  gentamicin (GARAMYCIN) 450 mg in dextrose 5 % 100 mL IVPB        5 mg/kg  89.8 kg (Adjusted) 111.3 mL/hr over 60 Minutes Intravenous 30 min pre-op 02/20/22 0738 02/20/22 1455   02/18/22 0615  gentamicin (GARAMYCIN) 450 mg in dextrose 5 % 100 mL IVPB        5 mg/kg  89.8 kg (Adjusted) 111.3 mL/hr over 60 Minutes Intravenous 30 min pre-op 02/18/22 0615 02/18/22 0838       Assessment/Plan:  Proceed as planned with 3rd (likely final) stage Rt PCNL for complete staghorn stone. Suspect some residual upper mid and upper pole stone that will be adressed as well as clear ureter. Risks, benefits, expected peri-op course discussed.   Alexis Frock 02/23/2022

## 2022-02-23 NOTE — Transfer of Care (Signed)
Immediate Anesthesia Transfer of Care Note  Patient: [name]  Procedure(s) Performed: NEPHROLITHOTOMY PERCUTANEOUS THIRD STAGE AND STENT PLACEMENT (Back)  Patient Location: PACU  Anesthesia Type:General  Level of Consciousness: drowsy  Airway & Oxygen Therapy: Patient Spontanous Breathing and Patient connected to face mask oxygen  Post-op Assessment: Report given to RN and Post -op Vital signs reviewed and stable  Post vital signs: Reviewed and stable  Last Vitals:  Vitals Value Taken Time  BP 144/87 02/23/22 1927  Temp    Pulse 86 02/23/22 1931  Resp 18 02/23/22 1931  SpO2 90 % 02/23/22 1931  Vitals shown include unvalidated device data.  Last Pain:  Vitals:   02/23/22 1619  TempSrc: Oral  PainSc: 7       Patients Stated Pain Goal: 4 (47/53/39 1792)  Complications: No notable events documented.

## 2022-02-23 NOTE — Anesthesia Preprocedure Evaluation (Addendum)
Anesthesia Evaluation  Patient identified by MRN, date of birth, ID band Patient awake    Reviewed: Allergy & Precautions, NPO status , Patient's Chart, lab work & pertinent test results  Airway Mallampati: III  TM Distance: >3 FB Neck ROM: Full    Dental  (+) Teeth Intact, Dental Advisory Given   Pulmonary neg pulmonary ROS,    Pulmonary exam normal        Cardiovascular hypertension, Pt. on medications  Rhythm:Regular Rate:Normal     Neuro/Psych negative neurological ROS  negative psych ROS   GI/Hepatic negative GI ROS, Neg liver ROS,   Endo/Other  Morbid obesityBMI 38  Renal/GU Renal diseaseRight staghorn calculi  negative genitourinary   Musculoskeletal  (+) Arthritis , Osteoarthritis,    Abdominal (+) + obese,   Peds  Hematology  (+) Blood dyscrasia, Sickle cell trait , hct 32.1    Anesthesia Other Findings   Reproductive/Obstetrics negative OB ROS                             Anesthesia Physical Anesthesia Plan  ASA: 3  Anesthesia Plan: General   Post-op Pain Management: Tylenol PO (pre-op)*   Induction: Intravenous  PONV Risk Score and Plan: 3 and Ondansetron, Dexamethasone, Midazolam and Treatment may vary due to age or medical condition  Airway Management Planned: Oral ETT  Additional Equipment: None  Intra-op Plan:   Post-operative Plan: Extubation in OR  Informed Consent: I have reviewed the patients History and Physical, chart, labs and discussed the procedure including the risks, benefits and alternatives for the proposed anesthesia with the patient or authorized representative who has indicated his/her understanding and acceptance.     Dental advisory given  Plan Discussed with: CRNA  Anesthesia Plan Comments: (Access: 18G PIV x 1)       Anesthesia Quick Evaluation

## 2022-02-23 NOTE — Brief Op Note (Signed)
02/23/2022  7:10 PM  PATIENT:  [name]  61 y.o. female  PRE-OPERATIVE DIAGNOSIS:  kidney stone, right  POST-OPERATIVE DIAGNOSIS:  kidney stone, right  PROCEDURE:  Procedure(s): NEPHROLITHOTOMY PERCUTANEOUS THIRD STAGE AND STENT PLACEMENT (Right)  SURGEON:  Surgeon(s) and Role:    * Alexis Frock, MD - Primary  PHYSICIAN ASSISTANT:   ASSISTANTS: none   ANESTHESIA:   general  EBL:  minimal   BLOOD ADMINISTERED:none  DRAINS:  foley to gravity    LOCAL MEDICATIONS USED:  NONE  SPECIMEN:  Source of Specimen:  Rt renal + ureteral stone fragments  DISPOSITION OF SPECIMEN:   discard  COUNTS:  YES  TOURNIQUET:  * No tourniquets in log *  DICTATION: .Other Dictation: Dictation Number [id number]  PLAN OF CARE: Admit to inpatient   PATIENT DISPOSITION:  PACU - hemodynamically stable.   Delay start of Pharmacological VTE agent (>24hrs) due to surgical blood loss or risk of bleeding: yes

## 2022-02-23 NOTE — Progress Notes (Signed)
Pharmacy consulted to enter a one time dose of gentamicin for surgical prophylaxis.   Gentamicin 460 mg (5 mg/kg) based on AdjBW of 91 kg ordered. Pharmacy to sign off.   Lenis Noon, PharmD 02/23/22 12:31 PM

## 2022-02-23 NOTE — Anesthesia Postprocedure Evaluation (Signed)
Anesthesia Post Note  Patient: [name]  Procedure(s) Performed: NEPHROLITHOTOMY PERCUTANEOUS THIRD STAGE AND STENT PLACEMENT (Back)     Patient location during evaluation: PACU Anesthesia Type: General Level of consciousness: awake and alert, oriented and patient cooperative Pain management: pain level controlled Vital Signs Assessment: post-procedure vital signs reviewed and stable Respiratory status: spontaneous breathing, nonlabored ventilation and respiratory function stable Cardiovascular status: blood pressure returned to baseline and stable Postop Assessment: no apparent nausea or vomiting Anesthetic complications: no   No notable events documented.  Last Vitals:  Vitals:   02/23/22 1619 02/23/22 1930  BP: (!) 147/82 (!) 153/83  Pulse: 73 90  Resp: 14 (!) 23  Temp: 37.7 C 36.5 C  SpO2: 92% 97%    Last Pain:  Vitals:   02/23/22 1930  TempSrc:   PainSc: 0-No pain                 Pervis Hocking

## 2022-02-24 ENCOUNTER — Encounter (HOSPITAL_COMMUNITY): Payer: Self-pay | Admitting: Urology

## 2022-02-24 LAB — BASIC METABOLIC PANEL
Anion gap: 7 (ref 5–15)
BUN: 7 mg/dL (ref 6–20)
CO2: 26 mmol/L (ref 22–32)
Calcium: 8.9 mg/dL (ref 8.9–10.3)
Chloride: 104 mmol/L (ref 98–111)
Creatinine, Ser: 0.77 mg/dL (ref 0.44–1.00)
GFR, Estimated: >60 mL/min (ref >60)
Glucose, Bld: 114 mg/dL — ABNORMAL HIGH (ref 70–99)
Potassium: 3.7 mmol/L (ref 3.5–5.1)
Sodium: 137 mmol/L (ref 135–145)

## 2022-02-24 LAB — HEMOGLOBIN AND HEMATOCRIT, BLOOD
HCT: 30.6 % — ABNORMAL LOW (ref 36.0–46.0)
Hemoglobin: 9.8 g/dL — ABNORMAL LOW (ref 12.0–15.0)

## 2022-02-24 MED ORDER — ONDANSETRON HCL 4 MG/2ML IJ SOLN
4.0000 mg | Freq: Three times a day (TID) | INTRAMUSCULAR | Status: DC | PRN
  Administered 2022-02-24: 4 mg via INTRAVENOUS
  Filled 2022-02-24: qty 2

## 2022-02-24 MED ORDER — SENNOSIDES-DOCUSATE SODIUM 8.6-50 MG PO TABS: 1.0000 | ORAL_TABLET | Freq: Two times a day (BID) | ORAL | 0 refills | Status: DC

## 2022-02-24 MED ORDER — CEPHALEXIN 500 MG PO CAPS
500.0000 mg | ORAL_CAPSULE | Freq: Two times a day (BID) | ORAL | 0 refills | Status: AC
Start: 2022-02-24 — End: 2022-02-27

## 2022-02-24 MED ORDER — HYDROMORPHONE HCL 2 MG PO TABS
1.0000 mg | ORAL_TABLET | Freq: Four times a day (QID) | ORAL | 0 refills | Status: AC | PRN
Start: 2022-02-24 — End: 2022-03-01

## 2022-02-24 NOTE — Op Note (Signed)
[name], [name] MEDICAL RECORD NO: [id number] ACCOUNT NO: [id number] DATE OF BIRTH: 1961/03/23 FACILITY: [facility] LOCATION: [id number] PHYSICIAN: Alexis Frock, MD  Operative Report   DATE OF PROCEDURE: 02/23/2022   PREOPERATIVE DIAGNOSIS:  Residual right renal stone, history of staghorn calculus.  PROCEDURES:   1.  Right third stage percutaneous nephrostolithotomy second look with stone removal. 2.  Right antegrade nephrostogram interpretation. 3.  Exchange of right ureteral stent.  ESTIMATED BLOOD LOSS:  Nil.  COMPLICATIONS:  None.  SPECIMEN:  Right renal stone fragments for discard.  FINDINGS:   1.  Residual right renal stone, mostly free floating renal pelvis and upper pole.  Additional stone and acutely angled upper mid calix, all accessible. 2.  Successful placement of right ureteral stent.  INDICATIONS:  The patient is a very pleasant 61 year old lady with history of morbid obesity, was found on workup evaluation for relapse surgery to have a right complete staghorn kidney stone.  Renal function was preserved.  She was referred for  consideration of management, options were discussed and we recommended path of staged percutaneous nephrostolithotomy with goal of maximal stone removal.  She had first stage procedure on 02/18/2022, second stage of procedure 02/20/2022 towards the vast  majority of her stones removed.  Given the complex nature of her stone, the third stage procedure was warranted.  She presents for this today.  Informed consent was obtained and placed in medical record.  PROCEDURE DETAILS:  The patient being verified and procedure being right third stage percutaneous nephrolithotomy was confirmed.  Procedure timeout was performed.  Intravenous antibiotics were administered.  General endotracheal anesthesia induced.  The  patient was then repositioned, placed into a prone position, plane prone view, chest rolls, axillary rolls, padding of her knees and ankles.   Sequential compression devices were applied.  The tissue of her buttocks was taped to allow more flat plane in  the right flank. Sterile field was created, prepped and draped in the patient's entire right flank including in situ, upper and lower nephrostomy tubes and nephroureteral stent using chlorhexidine gluconate and a percutaneous drape was applied.  Initial antegrade nephrostogram revealed excellent placement of both upper and lower nephrostomy tubes and nephroureteral stent.  There was no significant extravasation.  There was residual stone material noted in upper mid calix.  It appeared to be  acutely angled. ZIPwire was advanced down to the level of the upper nephrostomy tube to the level of the bladder set aside for upper safety wire.  Right lower nephroureteral stent acting as a lower safety wire and both nephrostomy tubes were removed.   Flexible nephroscopy was performed using a 16-French flexible cystoscope.  Inspection of the lower pole in the renal pelvis area revealed several additional free floating stone fragments.  Total volume approximately 1 cm2.  These were amenable to simple  basketing.  The UPJ was identified and there was a stone in this location.  They were all amenable to simple basketing.  There appeared to be in nephroscopy some residual stone in an acutely angled upper mid calix.  This was not visible whatsoever  despite the different accesses and without sheath usage. However it was felt that we had certainly achieved the goals of the stone removal.  Next, the single channel flexible digital ureteroscope was advanced via the upper access down to the level of the  bladder and hand inspection of the ureter revealed several additional stone fragments, which were amenable to simple basketing, removed, set aside, thus removing all  stone fragments from the renal pelvis all the way to the bladder.  I was quite happy  with the completeness of the surgery today, it is felt that a  tubeless technique would be most prudent and a new 6 x 24 contour type stent was placed over the superior most safety wire using a combination of nephroscopic and fluoroscopic guidance.  Good  proximal and distal planes were noted. Left ureteral stent was removed such that all percutaneous access had been removed.  The upper and lower skin sites were reapproximated using interrupted Vicryl x4 upper and lower and procedure was terminated.  The  patient tolerated the procedure well, no immediate periprocedural complications.  The patient was taken to postanesthesia care unit in stable condition.  We will plan for hospital stay tonight, labs in the morning and likely discharge home tomorrow.     SUJ D: 02/23/2022 7:15:39 pm T: 02/24/2022 2:04:00 am  JOB: [id number]/ [id number]

## 2022-02-24 NOTE — Progress Notes (Signed)
Patient voided 200 mL yellow urine at 1015, ambulated ad lib in hallway.  Angie Fava, RN

## 2022-02-24 NOTE — Plan of Care (Signed)

## 2022-02-24 NOTE — Progress Notes (Signed)
Discharge instructions provided to and reviewed with patient and patient's husband.  Surgical incision site care instructions reviewed using teach back, patient demonstrated understanding.  PIV removed.  Patient escorted to main entrance via wheelchair with belongings for transport home with husband.  Angie Fava, RN

## 2022-02-24 NOTE — Progress Notes (Signed)
Patient was able to pass some gas following patient's procedure. Patient also got up and ambulated to the bathroom and back to the bed with walker and 1 person assist. Patient tolerated the ambulation well. Patient also sat on the edge of the bed a couple of times. Patient did have one episode of feeling nauseous but did not vomit. Nurse ended up having to give patient a dose of IV Zofran for nausea, which helped.

## 2022-02-24 NOTE — Discharge Summary (Signed)
Physician Discharge Summary  Patient ID: [name] MRN: [id number] DOB/AGE: Jun 17, 1961 61 y.o.  Admit date: 02/18/2022 Discharge date: 02/24/2022  Admission Diagnoses: RIGHT Staghorn Kidney Stone  Discharge Diagnoses:  Principal Problem:   Staghorn calculus   Discharged Condition: good  Hospital Course: Pt underwent 1st stage percutaneous nehprostolithotomy on 02/18/22, the day of admission with upper and lower pole renal access. 2nd stage procedure on 2/24, and third stage on 2/27 at which point nephrostomy tubes removed and antegrade JJ stent placed. By the AM of 02/24/22, the day of discharge, she is ambulatory, pain controlled, tollerating PO intake, and felt to be adeqate for discharge. Hgb 9.8, Cr <1.5.   Consults: None  Significant Diagnostic Studies: labs: as per above  Treatments: surgery: as per above  Discharge Exam: Blood pressure (!) 154/74, pulse 64, temperature 98.2 F (36.8 C), resp. rate 18, height 5\' 4"  (1.626 m), weight (!) 147 kg, last menstrual period 06/19/2014, SpO2 94 %.  NAD, AOx3, pleasant, husband at bedside Non-labored breathing on RA RRR Stable very large truncal obesity Rt flank surgical sites with dry dressing, no hematomas / erythema No c/c/e  Disposition: Discharge disposition: 01-Home or Self Care        Allergies as of 02/24/2022       Reactions   Oxycodone Shortness Of Breath        Medication List     TAKE these medications    aspirin 325 MG tablet Take 325 mg by mouth daily.   atorvastatin 20 MG tablet Commonly known as: LIPITOR Take 20 mg by mouth daily in the afternoon.   BC HEADACHE PO Take 1 packet by mouth daily as needed (headaches/pain).   cephALEXin 500 MG capsule Commonly known as: KEFLEX Take 1 capsule (500 mg total) by mouth 2 (two) times daily for 3 days. Begin day before next office visit.   cholecalciferol 1000 units tablet Commonly known as: VITAMIN D Take 1,000 Units by mouth daily.    diphenhydramine-acetaminophen 25-500 MG Tabs tablet Commonly known as: TYLENOL PM Take 1 tablet by mouth at bedtime as needed (sleep).   Fish Oil 1200 MG Caps Take 1,200 mg by mouth daily.   gabapentin 300 MG capsule Commonly known as: NEURONTIN Take 300 mg by mouth 3 (three) times daily.   HYDROmorphone 2 MG tablet Commonly known as: Dilaudid Take 0.5 tablets (1 mg total) by mouth every 6 (six) hours as needed for up to 5 days for severe pain (post-operatively).   ICY HOT EX Apply 1 application topically daily as needed (pain).   lisinopril-hydrochlorothiazide 20-12.5 MG tablet Commonly known as: ZESTORETIC Take 1 tablet by mouth daily.   oxymetazoline 0.05 % nasal spray Commonly known as: AFRIN Place 1 spray into both nostrils 2 (two) times daily as needed for congestion.   senna-docusate 8.6-50 MG tablet Commonly known as: Senokot-S Take 1 tablet by mouth 2 (two) times daily. While taking strong pain meds to prevent constipation        Follow-up Information     Alexis Frock, MD Follow up on 03/10/2022.   Specialty: Urology Why: at 9:30 for MD visit Contact information: [place] [address] [phone]                 Signed: Alexis Frock 02/24/2022, 7:45 AM

## 2022-02-24 NOTE — Progress Notes (Signed)
Foley catheter removed at 0810 per MD order.  Angie Fava, RN

## 2022-02-24 NOTE — Plan of Care (Signed)

## 2022-03-02 ENCOUNTER — Emergency Department (HOSPITAL_COMMUNITY): Payer: Commercial Managed Care - PPO

## 2022-03-02 ENCOUNTER — Encounter (HOSPITAL_COMMUNITY): Payer: Self-pay

## 2022-03-02 ENCOUNTER — Emergency Department (HOSPITAL_COMMUNITY)
Admission: EM | Admit: 2022-03-02 | Discharge: 2022-03-02 | Disposition: A | Discharge status: Home / Self Care | Payer: Commercial Managed Care - PPO | Attending: Emergency Medicine | Admitting: Emergency Medicine

## 2022-03-02 ENCOUNTER — Other Ambulatory Visit: Payer: Self-pay

## 2022-03-02 DIAGNOSIS — M79604 Pain in right leg: Secondary | ICD-10-CM

## 2022-03-02 DIAGNOSIS — M7989 Other specified soft tissue disorders: Secondary | ICD-10-CM | POA: Diagnosis not present

## 2022-03-02 DIAGNOSIS — M79671 Pain in right foot: Secondary | ICD-10-CM

## 2022-03-02 DIAGNOSIS — I10 Essential (primary) hypertension: Secondary | ICD-10-CM | POA: Diagnosis not present

## 2022-03-02 DIAGNOSIS — R0602 Shortness of breath: Secondary | ICD-10-CM | POA: Insufficient documentation

## 2022-03-02 LAB — CBC WITH DIFFERENTIAL/PLATELET
Abs Immature Granulocytes: 0.09 10*3/uL — ABNORMAL HIGH (ref 0.00–0.07)
Basophils Absolute: 0.1 10*3/uL (ref 0.0–0.1)
Basophils Relative: 0 %
Eosinophils Absolute: 0.3 10*3/uL (ref 0.0–0.5)
Eosinophils Relative: 2 %
HCT: 33.2 % — ABNORMAL LOW (ref 36.0–46.0)
Hemoglobin: 10.7 g/dL — ABNORMAL LOW (ref 12.0–15.0)
Immature Granulocytes: 1 %
Lymphocytes Relative: 20 %
Lymphs Abs: 2.4 10*3/uL (ref 0.7–4.0)
MCH: 26.9 pg (ref 26.0–34.0)
MCHC: 32.2 g/dL (ref 30.0–36.0)
MCV: 83.4 fL (ref 80.0–100.0)
Monocytes Absolute: 1.2 10*3/uL — ABNORMAL HIGH (ref 0.1–1.0)
Monocytes Relative: 10 %
Neutro Abs: 8 10*3/uL — ABNORMAL HIGH (ref 1.7–7.7)
Neutrophils Relative %: 67 %
Platelets: 358 10*3/uL (ref 150–400)
RBC: 3.98 MIL/uL (ref 3.87–5.11)
RDW: 14.3 % (ref 11.5–15.5)
WBC: 12 10*3/uL — ABNORMAL HIGH (ref 4.0–10.5)
nRBC: 0 % (ref 0.0–0.2)

## 2022-03-02 LAB — BASIC METABOLIC PANEL
Anion gap: 10 (ref 5–15)
BUN: 9 mg/dL (ref 6–20)
CO2: 27 mmol/L (ref 22–32)
Calcium: 9.3 mg/dL (ref 8.9–10.3)
Chloride: 103 mmol/L (ref 98–111)
Creatinine, Ser: 0.95 mg/dL (ref 0.44–1.00)
GFR, Estimated: >60 mL/min (ref >60)
Glucose, Bld: 97 mg/dL (ref 70–99)
Potassium: 3.5 mmol/L (ref 3.5–5.1)
Sodium: 140 mmol/L (ref 135–145)

## 2022-03-02 MED ORDER — NAPROXEN 250 MG PO TABS
500.0000 mg | ORAL_TABLET | Freq: Once | ORAL | Status: AC
  Administered 2022-03-02: 500 mg via ORAL
  Filled 2022-03-02: qty 2

## 2022-03-02 NOTE — Discharge Instructions (Addendum)
No evidence of blood clot in the right leg which is great news.  Would like for you to follow-up with your podiatrist with regard to the x-ray findings we discussed today.  You can take Tylenol or ibuprofen for pain control at home.  Please wear shoes with good support when you are walking to limit contact with the hard ground.  Please return to the emergency department for worsening symptoms. ?

## 2022-03-02 NOTE — ED Provider Notes (Signed)
[facility] EMERGENCY DEPARTMENT Provider Note   CSN: [id number] Arrival date & time: 03/02/22  8299     History Chief Complaint  Patient presents with   Foot Pain    [name] is a 61 y.o. female with history of nephrolithiasis status post nephrolithotomy on 02/23/2022 who presents to the emergency department today with right-sided leg and foot pain started roughly 3 days ago.  Patient states she struck the back of her heel against a desk 3 days ago and has been having progressive pain since then.  She has not taken any thing for her pain.  Pain radiates up the right leg.  She does report associated leg swelling and shortness of breath which she describes as a sensation of having to take a deep breath.  She denies chest pain, fever, chills, hormone replacement use, recent travel.  Patient takes baby aspirin daily.  No other anticoagulation.   Foot Pain      Home Medications Prior to Admission medications   Medication Sig Start Date End Date Taking? Authorizing Provider  aspirin 325 MG tablet Take 325 mg by mouth daily.    [provider]  Aspirin-Salicylamide-Caffeine (BC HEADACHE PO) Take 1 packet by mouth daily as needed (headaches/pain).    [provider]  atorvastatin (LIPITOR) 20 MG tablet Take 20 mg by mouth daily in the afternoon.    [provider]  cholecalciferol (VITAMIN D) 1000 UNITS tablet Take 1,000 Units by mouth daily.      [provider]  diphenhydramine-acetaminophen (TYLENOL PM) 25-500 MG TABS tablet Take 1 tablet by mouth at bedtime as needed (sleep).    [provider]  gabapentin (NEURONTIN) 300 MG capsule Take 300 mg by mouth 3 (three) times daily.    [provider]  lisinopril-hydrochlorothiazide (PRINZIDE,ZESTORETIC) 20-12.5 MG per tablet Take 1 tablet by mouth daily.      [provider]  Menthol, Topical Analgesic, (ICY HOT EX) Apply 1 application topically daily as needed (pain).     [provider]  Omega-3 Fatty Acids (FISH OIL) 1200 MG CAPS Take 1,200 mg by mouth daily.    [provider]  oxymetazoline (AFRIN) 0.05 % nasal spray Place 1 spray into both nostrils 2 (two) times daily as needed for congestion.    [provider]  senna-docusate (SENOKOT-S) 8.6-50 MG tablet Take 1 tablet by mouth 2 (two) times daily. While taking strong pain meds to prevent constipation 02/24/22   Alexis Frock, MD      Allergies    Oxycodone    Review of Systems   Review of Systems  All other systems reviewed and are negative.  Physical Exam Updated Vital Signs BP 121/85    Pulse 63    Temp 98.4 F (36.9 C) (Oral)    Resp 20    Ht '5\' 4"'$  (1.626 m)    Wt (!) 147 kg    LMP 06/19/2014    SpO2 100%    BMI 55.61 kg/m  Physical Exam Vitals and nursing note reviewed.  Constitutional:      General: She is not in acute distress.    Appearance: Normal appearance. She is obese.  HENT:     Head: Normocephalic and atraumatic.  Eyes:     General:        Right eye: No discharge.        Left eye: No discharge.  Cardiovascular:     Comments: Regular rate and rhythm.  S1/S2 are distinct without  any evidence of murmur, rubs, or gallops.  Radial pulses are 2+ bilaterally. No evidence of pedal edema. Pulmonary:     Comments: Clear to auscultation bilaterally.  Normal effort.  No respiratory distress.  No evidence of wheezes, rales, or rhonchi heard throughout. Abdominal:     General: Abdomen is flat. Bowel sounds are normal. There is no distension.     Tenderness: There is no abdominal tenderness. There is no guarding or rebound.  Musculoskeletal:        General: Normal range of motion.     Cervical back: Neck supple.     Comments: Right leg is minimally tender to palpation.  Difficult to assess swelling secondary to patient's body habitus.  2+ dorsalis pedis pulse felt on the right.  Skin:    General: Skin is warm and dry.     Findings: No rash.  Neurological:      General: No focal deficit present.     Mental Status: She is alert.  Psychiatric:        Mood and Affect: Mood normal.        Behavior: Behavior normal.    ED Results / Procedures / Treatments   Labs (all labs ordered are listed, but only abnormal results are displayed) Labs Reviewed  CBC WITH DIFFERENTIAL/PLATELET - Abnormal; Notable for the following components:      Result Value   WBC 12.0 (*)    Hemoglobin 10.7 (*)    HCT 33.2 (*)    Neutro Abs 8.0 (*)    Monocytes Absolute 1.2 (*)    Abs Immature Granulocytes 0.09 (*)    All other components within normal limits  BASIC METABOLIC PANEL    EKG None  Radiology US Venous Img Lower Unilateral Right  Result Date: 03/02/2022 CLINICAL DATA:  RIGHT lower extremity pain and swelling for 3 days EXAM: RIGHT LOWER EXTREMITY VENOUS DOPPLER ULTRASOUND TECHNIQUE: Gray-scale sonography with compression, as well as color and duplex ultrasound, were performed to evaluate the deep venous system(s) from the level of the common femoral vein through the popliteal and proximal calf veins. COMPARISON:  05/09/2012 FINDINGS: VENOUS Normal compressibility of the common femoral, superficial femoral, and popliteal veins, as well as the visualized calf veins. Visualized portions of profunda femoral vein and great saphenous vein unremarkable. No filling defects to suggest DVT on grayscale or color Doppler imaging. Doppler waveforms show normal direction of venous flow, normal respiratory plasticity and response to augmentation. Limited views of the contralateral common femoral vein are unremarkable. OTHER None. Limitations: none IMPRESSION: No evidence of deep venous thrombosis in the RIGHT lower extremity. Electronically Signed   By: Lavonia Dana M.D.   On: 03/02/2022 10:11   DG Foot Complete Right  Result Date: 03/02/2022 CLINICAL DATA:  Right foot pain. Severe pain in right heel after hitting heel several days ago. EXAM: RIGHT FOOT COMPLETE - 3+ VIEW  COMPARISON:  Right foot radiographs 03/12/2016. FINDINGS: Moderate to large calcaneal heel spur. Mild dorsal degenerative spurring at the tarsometatarsal joints diffusely on lateral view. Moderate second and mild first tarsometatarsal joint space narrowing on frontal and oblique view. No acute fracture or dislocation. IMPRESSION:: IMPRESSION: 1. Moderate to large calcaneal heel spur. 2. 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 Electronically Signed   By: Yvonne Kendall M.D.   On: 03/02/2022 08:27    Procedures Procedures    Medications Ordered in ED Medications  naproxen (NAPROSYN) tablet 500 mg (500 mg Oral Given 03/02/22 6767)    ED  Course/ Medical Decision Making/ A&P                           Medical Decision Making Amount and/or Complexity of Data Reviewed Labs: ordered. Radiology: ordered. ECG/medicine tests: ordered.  Risk Prescription drug management.   This patient presents to the ED for concern of right foot and leg pain, this involves an extensive number of treatment options, and is a complaint that carries with it a high risk of complications and morbidity.  The differential diagnosis includes DVT, musculoskeletal injury.  I would low suspicion for cellulitis as the patient is not having systemic symptoms nor is the right leg significantly swollen or erythematous or taut.  Pulmonary embolism is of slight concern at this time concerned the patient is short of breath with her associated leg pain.  Patient's vital signs are otherwise normal without any evidence of hypoxia or tachycardia.   Co morbidities that complicate the patient evaluation  Recent nephrolithotomy Hypertension Arthritis   Additional history obtained:  Additional history obtained from old records and nursing note. External records from outside source obtained and reviewed including surgical note from a couple of weeks ago discussing her nephrolithotomy and hospital course.  No acute  vents happened at that time.   Lab Tests:  I Ordered, and personally interpreted labs.  The pertinent results include: CBC which showed evidence of leukocytosis and ongoing anemia which seems to be at baseline for the patient.  BMP was normal.   Imaging Studies ordered:  I ordered imaging studies including ultrasound of the right lower extremity and x-ray of the right foot I independently visualized and interpreted imaging which showed no evidence of DVT in the right leg.  X-ray of the right foot showed good sized bone spur in the right calcaneus I agree with the radiologist interpretation   Cardiac Monitoring:  The patient was maintained on a cardiac monitor.  I personally viewed and interpreted the cardiac monitored which showed an underlying rhythm of: Normal sinus rhythm   Medicines ordered and prescription drug management:  I ordered medication including Naproxen for pain Reevaluation of the patient after these medicines showed that the patient improved I have reviewed the patients home medicines and have made adjustments as needed   Test Considered:  CT PE study for possible pulmonary embolism.  However, patient's vitals are normal and she is not hypoxic nor she having shortness of breath on reevaluation.   Critical Interventions:  Pain control   Problem List / ED Course:  Right foot and leg pain likely secondary to injury and right calcaneal bone spur.  No evidence of DVT on imaging which is reassuring.  I have a low suspicion for pulmonary embolism at this time.  Naproxen did improve her pain.  Patient does have a podiatrist and will call them.  I offered a prescription for naproxen which patient declined.  I told her she could wear shoes with good sole for support and she could ice the area especially after periods of walking.  Tylenol ibuprofen for pain.  I discussed strict return precautions with the patient at the bedside.  Her and her family expressed full  understanding.  She is safe for discharge with outpatient follow-up with her podiatrist.   Reevaluation:  After the interventions noted above, I reevaluated the patient and found that they have :improved   Social Determinants of Health:  Obese   Dispostion:  After consideration of the diagnostic results and  the patients response to treatment, I feel that the patent would benefit from outpatient follow-up with her podiatrist.  Final Clinical Impression(s) / ED Diagnoses Final diagnoses:  Foot pain, right  Right leg pain    Rx / DC Orders ED Discharge Orders     None         Cherrie Gauze 03/02/22 1202    Milton Ferguson, MD 03/03/22 1725

## 2022-03-02 NOTE — ED Triage Notes (Signed)
Pt presents to ED with complaints of right heel pain. Pt states she hit her foot 3 days ago and the pain has gotten worse, shooting up her right leg.  ?

## 2022-04-22 ENCOUNTER — Other Ambulatory Visit (HOSPITAL_COMMUNITY): Payer: Self-pay | Admitting: Family Medicine

## 2022-04-22 DIAGNOSIS — Z1231 Encounter for screening mammogram for malignant neoplasm of breast: Secondary | ICD-10-CM

## 2022-05-01 ENCOUNTER — Ambulatory Visit (HOSPITAL_COMMUNITY)
Admission: RE | Admit: 2022-05-01 | Discharge: 2022-05-01 | Disposition: A | Discharge status: Home / Self Care | Payer: Commercial Managed Care - PPO | Source: Ambulatory Visit | Attending: Family Medicine | Admitting: Family Medicine

## 2022-05-01 DIAGNOSIS — Z1231 Encounter for screening mammogram for malignant neoplasm of breast: Secondary | ICD-10-CM | POA: Insufficient documentation

## 2022-07-10 ENCOUNTER — Ambulatory Visit (HOSPITAL_COMMUNITY): Payer: Commercial Managed Care - PPO | Attending: Orthopaedic Surgery

## 2022-07-10 ENCOUNTER — Encounter (HOSPITAL_COMMUNITY): Payer: Self-pay

## 2022-07-10 DIAGNOSIS — R2689 Other abnormalities of gait and mobility: Secondary | ICD-10-CM | POA: Insufficient documentation

## 2022-07-10 DIAGNOSIS — M2140 Flat foot [pes planus] (acquired), unspecified foot: Secondary | ICD-10-CM

## 2022-07-10 DIAGNOSIS — M6281 Muscle weakness (generalized): Secondary | ICD-10-CM | POA: Insufficient documentation

## 2022-07-10 DIAGNOSIS — M79672 Pain in left foot: Secondary | ICD-10-CM

## 2022-07-10 NOTE — Therapy (Signed)
OUTPATIENT PHYSICAL THERAPY LOWER EXTREMITY EVALUATION   Patient Name: [name] MRN: [id number] DOB:1961/03/02, 61 y.o., female Today's Date: 07/10/2022   PT End of Session - 07/10/22 0903     Visit Number 1    Number of Visits 8    Date for PT Re-Evaluation 09/04/22    Authorization Type [employer]/ [employer] PPO (no auth req/ 30 visit limit hard max)    Authorization - Visit Number 1    Authorization - Number of Visits 29    Progress Note Due on Visit 8    PT Start Time 0904    PT Stop Time 0945    PT Time Calculation (min) 41 min    Behavior During Therapy [facility] for tasks assessed/performed             Past Medical History:  Diagnosis Date   Arthritis    Bronchitis    History of kidney stones    Hypertension    Obesity    Sickle cell trait ([place])    Past Surgical History:  Procedure Laterality Date   ABDOMINAL HYSTERECTOMY N/A 07/10/2014   Procedure: HYSTERECTOMY ABDOMINAL;  Surgeon: Jonnie Kind, MD;  Location: [facility];  Service: Gynecology;  Laterality: N/A;   BACK SURGERY     CATARACT EXTRACTION W/PHACO Left 10/10/2018   Procedure: CATARACT EXTRACTION PHACO AND INTRAOCULAR LENS PLACEMENT ([place]);  Surgeon: Tonny Branch, MD;  Location: [facility];  Service: Ophthalmology;  Laterality: Left;  CDE: 2.63   CATARACT EXTRACTION W/PHACO Right 10/27/2018   Procedure: CATARACT EXTRACTION PHACO AND INTRAOCULAR LENS PLACEMENT RIGHT EYE ;  Surgeon: Tonny Branch, MD;  Location: [facility];  Service: Ophthalmology;  Laterality: Right;  right   COLONOSCOPY N/A 07/21/2013   Procedure: COLONOSCOPY;  Surgeon: Danie Binder, MD;  Location: [facility];  Service: Endoscopy;  Laterality: N/A;  10:15 AM   CYST EXCISION Right    knee   CYSTOSCOPY W/ URETERAL STENT PLACEMENT Right 02/18/2022   Procedure: CYSTOSCOPY WITH RETROGRADE PYELOGRAM/URETERAL STENT PLACEMENT;  Surgeon: Alexis Frock, MD;  Location: [facility];  Service: Urology;  Laterality: Right;   HOLMIUM LASER APPLICATION Right 7/82/9562    Procedure: HOLMIUM LASER APPLICATION;  Surgeon: Alexis Frock, MD;  Location: [facility];  Service: Urology;  Laterality: Right;   NEPHROLITHOTOMY Right 02/18/2022   Procedure: FIRST STAGE NEPHROLITHOTOMY PERCUTANEOUS WITH SURGEON ACCESS;  Surgeon: Alexis Frock, MD;  Location: [facility];  Service: Urology;  Laterality: Right;  3 HRS   NEPHROLITHOTOMY Right 02/20/2022   Procedure: SECOND STAGENEPHROLITHOTOMY PERCUTANEOUS, NEPHROSTOMY TUBE CHANGE;  Surgeon: Alexis Frock, MD;  Location: [facility];  Service: Urology;  Laterality: Right;  3 HRS   NEPHROLITHOTOMY  02/23/2022   Procedure: NEPHROLITHOTOMY PERCUTANEOUS THIRD STAGE AND STENT PLACEMENT;  Surgeon: Alexis Frock, MD;  Location: [facility];  Service: Urology;;   SALPINGOOPHORECTOMY Bilateral 07/10/2014   Procedure: SALPINGO OOPHORECTOMY;  Surgeon: Jonnie Kind, MD;  Location: [facility];  Service: Gynecology;  Laterality: Bilateral;   TUBAL LIGATION     WRIST SURGERY     Patient Active Problem List   Diagnosis Date Noted   Staghorn calculus 02/18/2022   Trichomoniasis 08/29/2014   Status post total hysterectomy and bilateral salpingo-oophorectomy 07/13/2014   S/P hysterectomy with oophorectomy 07/10/2014   H N P-LUMBAR 02/24/2010   BACK PAIN 02/24/2010   HIGH BLOOD PRESSURE 02/20/2010    PCP: Dr Armandina Gemma at [place] PROVIDER: Armond Hang   REFERRING DIAG: PT eval/tx for [phone] posterior tibial  tendinitis lt leg per Vernon Prey Ramanathan,MD   THERAPY DIAG:  Pain in left foot  Muscle weakness (generalized)  Other abnormalities of gait and mobility  Pes planus, unspecified laterality  Rationale for Evaluation and Treatment Rehabilitation  ONSET DATE: 2021  SUBJECTIVE:   SUBJECTIVE STATEMENT: Patient referred for tendinitis of left foot and plantar fascitis. Pt also reports bone spur that is rubbing on left achilles. Pt reports that these issues have been going on for 2 years. Pt has had injection for left  ankle/foot aprox 1 year ago. Pt states that she had some relief with the injection but pain was still present. Pt reports that after standing for longer periods of time, bottom of her foot begins to start burning. States that she may need to have surgery for bone spur in achilles.   PERTINENT HISTORY: Left plantar fascitis, tendinitis and bone spur rubbing on left achilles.    History of sciatica on left LE. States she has it in the morning. Able to loosen up and start moving wihtout too much issue.  [name] is a 61 y.o. female with history of nephrolithiasis status post nephrolithotomy on 02/23/2022 who presents to the emergency department today with right-sided leg and foot pain started roughly 3 days ago.  Patient states she struck the back of her heel against a desk 3 days ago and has been having progressive pain since then.  She has not taken any thing for her pain.  Pain radiates up the right leg.  She does report associated leg swelling and shortness of breath which she describes as a sensation of having to take a deep breath.  She denies chest pain, fever, chills, hormone replacement use, recent travel.  Patient takes baby aspirin daily.  No other anticoagulation.   PAIN:  Are you having pain? Yes: NPRS scale: 6/10 Pain location: bottom of left foot running along entire bottom up into heel Pain description: burning and throbbing Aggravating factors: standing for awhile Relieving factors: gabapentin, Celebrex. Finds some relief with gabapentin and heel spur    PRECAUTIONS: Fall  WEIGHT BEARING RESTRICTIONS No  FALLS:  Has patient fallen in last 6 months? No  LIVING ENVIRONMENT: Lives with: lives with their spouse Lives in: House/apartment Stairs: Yes: External: 2-3 steps; none Has following equipment at home: Single point cane  OCCUPATION: patient has not worked in a year. Pt has been looking for remote jobs but previously was working for [employer].  PLOF:   intermittently started using a [place] 3 years ago. Only using [place] on bad days with increased pain.   PATIENT GOALS to decrease foot and ankle pain. To be able to walk around.    OBJECTIVE:   DIAGNOSTIC FINDINGS: FINDINGS: Moderate to large calcaneal heel spur. Mild dorsal degenerative spurring at the tarsometatarsal joints diffusely on lateral view. Moderate second and mild first tarsometatarsal joint space narrowing on frontal and oblique view. No acute fracture or dislocation.  PATIENT SURVEYS:  FOTO 51  COGNITION:  Overall cognitive status: Within functional limits for tasks assessed     PALPATION: TTP proximal to left achilles tendon Left pes planus  LOWER EXTREMITY MMT:  MMT Right eval Left eval  Hip flexion 3 3  Hip extension    Hip abduction 3 3  Hip adduction 3+ 3+  Hip internal rotation    Hip external rotation    Knee flexion 3+ 3+  Knee extension 4 3+  Ankle dorsiflexion 4 4  Ankle plantarflexion    Ankle  inversion 4 4  Ankle eversion 4 4   (Blank rows = not tested)   FUNCTIONAL TESTS:  2 minute walk test: 296'  GAIT: Distance walked: 296' on 2mt Assistive device utilized: None Level of assistance: Complete Independence Comments: decreased cadence, left foot pronation, decreased step length    TODAY'S TREATMENT: evaluation   PATIENT EDUCATION:  Education details: Patient educated on exam findings Person educated: Patient Education method: EConsulting civil engineer Demonstration, and Handouts Education comprehension: verbalized understanding, returned demonstration, verbal cues required, and tactile cues required    HOME EXERCISE PROGRAM: Initiate next session  ASSESSMENT:  CLINICAL IMPRESSION: Patient a 61y.o.  female who was seen today for physical therapy evaluation and treatment for left foot and ankle pain. Patient with 2 year history of pain. Pt reports she has been dx with plantar fascitis, tendinitis and bone spur rubbing on left achilles.   Pt presents with left pes planus and pronation on walking. Pt presents with pain with all ankle mmt and tenderness to proximal achilles tendon. Pt is limited in her ability to walk longer distances secondary to onset of burning and pain in bottom of foot. Pt with global LE weakness also limiting ability to walk longer distances. Pt scores 51 on foto today. Pt will benefit from skilled PT services to improve strength, decrease pain and work towards improved mobility and ambulation.     OBJECTIVE IMPAIRMENTS Abnormal gait, decreased activity tolerance, decreased balance, decreased endurance, decreased mobility, difficulty walking, decreased strength, obesity, and pain.   ACTIVITY LIMITATIONS standing, squatting, stairs, and locomotion level  PARTICIPATION LIMITATIONS: cleaning, laundry, shopping, community activity, and yard work  PERSONAL FACTORS Fitness and 3+ comorbidities: Arthritis, Back pain, BMI over 30, Headaches, High Blood Pressure, Prior Surgery  are also affecting patient's functional outcome.   REHAB POTENTIAL: Fair pes planus, foot pronation, 2 year history of pain  CLINICAL DECISION MAKING: Stable/uncomplicated  EVALUATION COMPLEXITY: Moderate   GOALS: Goals reviewed with patient? No  SHORT TERM GOALS: Target date: 09/04/2022  Patient will be independent with HEP in order to improve functional outcomes. Baseline:  Goal status: INITIAL  2.  Patient will report at least 35% improvement in symptoms for improved quality of life. Baseline:  Goal status: INITIAL  4.  Patient will tolerate walking 1 mile before needing rest or being limited by onset of foot pain to work towards healthier unrestricted lifestyle.  Baseline: 1/2 mile Goal status: INITIAL   5.  Patient will improve FOTO score by at least 10 points in order to indicate improved tolerance to activity. Baseline: 51 Goal status: INITIAL  6..  Patient will b/l LE strength in all restricted planes for improved  ability to ambulate longer distance and tolerate being on feet longer times. Baseline: MMT Right eval Left eval  Hip flexion 3 3  Hip extension    Hip abduction 3 3  Hip adduction 3+ 3+  Hip internal rotation    Hip external rotation    Knee flexion 3+ 3+  Knee extension 4 3+  Ankle dorsiflexion 4 4  Ankle plantarflexion    Ankle inversion 4 4  Ankle eversion 4 4    Goal status: INITIAL  7.  Patient will be able to ambulate at least 400 feet in 2MWT in order to demonstrate improved tolerance to activity. Baseline: 296' Goal status: INITIAL    PLAN: PT FREQUENCY: 1x/week  PT DURATION: 8 weeks  PLANNED INTERVENTIONS: PLANNED INTERVENTIONS: Therapeutic exercises, Therapeutic activity, Neuromuscular re-education, Balance training, Gait training, Patient/Family  education, Joint manipulation, Joint mobilization, Stair training, Orthotic/Fit training, DME instructions, Aquatic Therapy, Dry Needling, Electrical stimulation, Spinal manipulation, Spinal mobilization, Cryotherapy, Moist heat, Compression bandaging, scar mobilization, Splintting, Taping, Traction, Ultrasound, Ionotophoresis '4mg'$ /ml Dexamethasone, and Manual therapy  PLAN FOR NEXT SESSION: initiate HEP    Etan Vasudevan, PT 07/10/2022, 1:01 PM

## 2022-07-13 ENCOUNTER — Encounter (HOSPITAL_COMMUNITY): Payer: Commercial Managed Care - PPO | Admitting: Physical Therapy

## 2022-07-22 ENCOUNTER — Ambulatory Visit (HOSPITAL_COMMUNITY): Payer: Commercial Managed Care - PPO | Attending: Orthopaedic Surgery | Admitting: Physical Therapy

## 2022-07-22 DIAGNOSIS — R2689 Other abnormalities of gait and mobility: Secondary | ICD-10-CM | POA: Diagnosis present

## 2022-07-22 DIAGNOSIS — M79672 Pain in left foot: Secondary | ICD-10-CM | POA: Insufficient documentation

## 2022-07-22 DIAGNOSIS — M6281 Muscle weakness (generalized): Secondary | ICD-10-CM | POA: Diagnosis present

## 2022-07-22 DIAGNOSIS — M2140 Flat foot [pes planus] (acquired), unspecified foot: Secondary | ICD-10-CM | POA: Insufficient documentation

## 2022-07-22 NOTE — Therapy (Signed)
OUTPATIENT PHYSICAL THERAPY LOWER EXTREMITY Treatment.   Patient Name: [name] MRN: [id number] DOB:1961-02-07, 61 y.o., female Today's Date: 07/22/2022   PT End of Session - 07/22/22 1108     Visit Number 2    Number of Visits 8    Date for PT Re-Evaluation 09/04/22    Authorization Type [employer]/ [employer] PPO (no auth req/ 30 visit limit hard max)    Authorization - Visit Number 2    Authorization - Number of Visits 29    Progress Note Due on Visit 8    PT Start Time 1030    PT Stop Time 1109    PT Time Calculation (min) 39 min              Past Medical History:  Diagnosis Date   Arthritis    Bronchitis    History of kidney stones    Hypertension    Obesity    Sickle cell trait ([place])    Past Surgical History:  Procedure Laterality Date   ABDOMINAL HYSTERECTOMY N/A 07/10/2014   Procedure: HYSTERECTOMY ABDOMINAL;  Surgeon: Jonnie Kind, MD;  Location: [facility];  Service: Gynecology;  Laterality: N/A;   BACK SURGERY     CATARACT EXTRACTION W/PHACO Left 10/10/2018   Procedure: CATARACT EXTRACTION PHACO AND INTRAOCULAR LENS PLACEMENT ([place]);  Surgeon: Tonny Branch, MD;  Location: [facility];  Service: Ophthalmology;  Laterality: Left;  CDE: 2.63   CATARACT EXTRACTION W/PHACO Right 10/27/2018   Procedure: CATARACT EXTRACTION PHACO AND INTRAOCULAR LENS PLACEMENT RIGHT EYE ;  Surgeon: Tonny Branch, MD;  Location: [facility];  Service: Ophthalmology;  Laterality: Right;  right   COLONOSCOPY N/A 07/21/2013   Procedure: COLONOSCOPY;  Surgeon: Danie Binder, MD;  [facility];  Service: Endoscopy;  Laterality: N/A;  10:15 AM   CYST EXCISION Right    knee   CYSTOSCOPY W/ URETERAL STENT PLACEMENT Right 02/18/2022   Procedure: CYSTOSCOPY WITH RETROGRADE PYELOGRAM/URETERAL STENT PLACEMENT;  Surgeon: Alexis Frock, MD;  [facility]: [facility];  Service: Urology;  Laterality: Right;   HOLMIUM LASER APPLICATION Right 9/32/6712   Procedure: HOLMIUM LASER APPLICATION;  Surgeon: Alexis Frock, MD;  [facility]: [facility];  Service: Urology;  Laterality: Right;   NEPHROLITHOTOMY Right 02/18/2022   Procedure: FIRST STAGE NEPHROLITHOTOMY PERCUTANEOUS WITH SURGEON ACCESS;  Surgeon: Alexis Frock, MD;  [facility];  Service: Urology;  Laterality: Right;  3 HRS   NEPHROLITHOTOMY Right 02/20/2022   Procedure: SECOND STAGENEPHROLITHOTOMY PERCUTANEOUS, NEPHROSTOMY TUBE CHANGE;  Surgeon: Alexis Frock, MD;  [facility];  Service: Urology;  Laterality: Right;  3 HRS   NEPHROLITHOTOMY  02/23/2022   Procedure: NEPHROLITHOTOMY PERCUTANEOUS THIRD STAGE AND STENT PLACEMENT;  Surgeon: Alexis Frock, MD;  [facility];  Service: Urology;;   SALPINGOOPHORECTOMY Bilateral 07/10/2014   Procedure: SALPINGO OOPHORECTOMY;  Surgeon: Jonnie Kind, MD;  Location: [facility];  Service: Gynecology;  Laterality: Bilateral;   TUBAL LIGATION     WRIST SURGERY     Patient Active Problem List   Diagnosis Date Noted   Staghorn calculus 02/18/2022   Trichomoniasis 08/29/2014   Status post total hysterectomy and bilateral salpingo-oophorectomy 07/13/2014   S/P hysterectomy with oophorectomy 07/10/2014   H N P-LUMBAR 02/24/2010   BACK PAIN 02/24/2010   HIGH BLOOD PRESSURE 02/20/2010    PCP: Dr Armandina Gemma at [place] PROVIDER: Armond Hang   REFERRING DIAG: PT eval/tx for [phone] posterior tibial tendinitis lt leg per Deepak Ramanathan,MD   THERAPY  DIAG:  Pain in left foot  Muscle weakness (generalized)  Other abnormalities of gait and mobility  Pes planus, unspecified laterality  Rationale for Evaluation and Treatment Rehabilitation  ONSET DATE: 2021  SUBJECTIVE:   SUBJECTIVE STATEMENT:  Pt states that she hurts down her whole left leg.   PERTINENT HISTORY: Left plantar fascitis, tendinitis and bone spur rubbing on left achilles.    History of sciatica on left LE. States she has it in the morning. Able to loosen up and start moving wihtout too much  issue.  [name] is a 61 y.o. female with history of nephrolithiasis status post nephrolithotomy on 02/23/2022 who presents to the emergency department today with right-sided leg and foot pain started roughly 3 days ago.  Patient states she struck the back of her heel against a desk 3 days ago and has been having progressive pain since then.  She has not taken any thing for her pain.  Pain radiates up the right leg.  She does report associated leg swelling and shortness of breath which she describes as a sensation of having to take a deep breath.  She denies chest pain, fever, chills, hormone replacement use, recent travel.  Patient takes baby aspirin daily.  No other anticoagulation.   PAIN:  Are you having pain? Yes: NPRS scale: 7/10 Pain location: bottom of left foot running along entire bottom up into heel Pain description: burning and throbbing Aggravating factors: standing for awhile Relieving factors: gabapentin, Celebrex. Finds some relief with gabapentin and heel spur    PRECAUTIONS: Fall  PATIENT GOALS to decrease foot and ankle pain. To be able to walk around.    OBJECTIVE:   DIAGNOSTIC FINDINGS: FINDINGS: Moderate to large calcaneal heel spur. Mild dorsal degenerative spurring at the tarsometatarsal joints diffusely on lateral view. Moderate second and mild first tarsometatarsal joint space narrowing on frontal and oblique view. No acute fracture or dislocation.  PATIENT SURVEYS:  FOTO 51  PALPATION: TTP proximal to left achilles tendon Left pes planus  LOWER EXTREMITY MMT:  MMT Right eval Left eval  Hip flexion 3 3  Hip extension    Hip abduction 3 3  Hip adduction 3+ 3+  Hip internal rotation    Hip external rotation    Knee flexion 3+ 3+  Knee extension 4 3+  Ankle dorsiflexion 4 4  Ankle plantarflexion    Ankle inversion 4 4  Ankle eversion 4 4   (Blank rows = not tested)   FUNCTIONAL TESTS:  2 minute walk test: 296'  GAIT: Distance  walked: 296' on 33mt Assistive device utilized: None Level of assistance: Complete Independence Comments: decreased cadence, left foot pronation, decreased step length    TODAY'S TREATMENT: 7/26:  Standing:  Gastroc stretch 3 x 30"                             Plantar stretch 3 x 30"                             Single leg stance with once finger support x 10            Sitting:      sit to stand x 10                             Great toe extension 3 x  30"           Supine:     Attempted bridge but to painful;  hamstring set LT x 10                            Ab and glut set combination x 10                            Active hamstring stretch with ankle pumps x 3 x 30"                               PATIENT EDUCATION:  Education details: Patient educated on exam findings Person educated: Patient Education method: Consulting civil engineer, Demonstration, and Handouts Education comprehension: verbalized understanding, returned demonstration, verbal cues required, and tactile cues required    HOME EXERCISE PROGRAM: Initiate next session  ASSESSMENT:  CLINICAL IMPRESSION: Reviewed evaluation and goals with patient.  Began pt on exercise program focusing on stretching with slight core strengthening.  Pt overall success will have to include higher core strengthening to allow support for LE>   OBJECTIVE IMPAIRMENTS Abnormal gait, decreased activity tolerance, decreased balance, decreased endurance, decreased mobility, difficulty walking, decreased strength, obesity, and pain.   ACTIVITY LIMITATIONS standing, squatting, stairs, and locomotion level  PARTICIPATION LIMITATIONS: cleaning, laundry, shopping, community activity, and yard work  PERSONAL FACTORS Fitness and 3+ comorbidities: Arthritis, Back pain, BMI over 30, Headaches, High Blood Pressure, Prior Surgery  are also affecting patient's functional outcome.   REHAB POTENTIAL: Fair pes planus, foot pronation, 2 year history of pain  CLINICAL  DECISION MAKING: Stable/uncomplicated  EVALUATION COMPLEXITY: Moderate   GOALS: Goals reviewed with patient? No  SHORT TERM GOALS: Target date: 09/04/2022  Patient will be independent with HEP in order to improve functional outcomes. Baseline:  Goal status: IN PROGRESS  2.  Patient will report at least 35% improvement in symptoms for improved quality of life. Baseline:  Goal status: IN PROGRESS  4.  Patient will tolerate walking 1 mile before needing rest or being limited by onset of foot pain to work towards healthier unrestricted lifestyle.  Baseline: 1/2 mile Goal status: IN PROGRESS   5.  Patient will improve FOTO score by at least 10 points in order to indicate improved tolerance to activity. Baseline: 51 Goal status: IN PROGRESS  6..  Patient will b/l LE strength in all restricted planes for improved ability to ambulate longer distance and tolerate being on feet longer times. Baseline: MMT Right eval Left eval  Hip flexion 3 3  Hip extension    Hip abduction 3 3  Hip adduction 3+ 3+  Hip internal rotation    Hip external rotation    Knee flexion 3+ 3+  Knee extension 4 3+  Ankle dorsiflexion 4 4  Ankle plantarflexion    Ankle inversion 4 4  Ankle eversion 4 4    Goal status: IN PROGRESS  7.  Patient will be able to ambulate at least 400 feet in 2MWT in order to demonstrate improved tolerance to activity. Baseline: 296' Goal status: IN PROGRESS    PLAN: PT FREQUENCY: 1x/week  PT DURATION: 8 weeks  PLANNED INTERVENTIONS: PLANNED INTERVENTIONS: Therapeutic exercises, Therapeutic activity, Neuromuscular re-education, Balance training, Gait training, Patient/Family education, Joint manipulation, Joint mobilization, Stair training, Orthotic/Fit training, DME instructions, Aquatic Therapy, Dry Needling, Electrical stimulation, Spinal manipulation, Spinal mobilization,  Cryotherapy, Moist heat, Compression bandaging, scar mobilization, Splintting, Taping,  Traction, Ultrasound, Ionotophoresis '4mg'$ /ml Dexamethasone, and Manual therapy  PLAN FOR NEXT SESSION:Continue with stretching and strengthening.   Rayetta Humphrey, PT CLT [phone]  07/22/2022, 11:10  AM

## 2022-07-30 ENCOUNTER — Encounter (HOSPITAL_COMMUNITY): Payer: Commercial Managed Care - PPO | Admitting: Physical Therapy

## 2022-08-04 ENCOUNTER — Ambulatory Visit (HOSPITAL_COMMUNITY): Payer: Commercial Managed Care - PPO | Attending: Orthopaedic Surgery | Admitting: Physical Therapy

## 2022-08-04 DIAGNOSIS — M6281 Muscle weakness (generalized): Secondary | ICD-10-CM | POA: Diagnosis present

## 2022-08-04 DIAGNOSIS — R2689 Other abnormalities of gait and mobility: Secondary | ICD-10-CM | POA: Diagnosis present

## 2022-08-04 DIAGNOSIS — M79672 Pain in left foot: Secondary | ICD-10-CM | POA: Insufficient documentation

## 2022-08-04 DIAGNOSIS — M2140 Flat foot [pes planus] (acquired), unspecified foot: Secondary | ICD-10-CM | POA: Insufficient documentation

## 2022-08-04 NOTE — Therapy (Signed)
OUTPATIENT PHYSICAL THERAPY LOWER EXTREMITY Treatment.   Patient Name: [name] MRN: [id number] DOB:November 18, 1961, 61 y.o., female Today's Date: 08/04/2022   PT End of Session - 08/04/22 1312     Visit Number 3    Number of Visits 8    Date for PT Re-Evaluation 09/04/22    Authorization Type U[employer]/ [employer] PPO (no auth req/ 30 visit limit hard max)    Authorization - Visit Number 3    Authorization - Number of Visits 29    Progress Note Due on Visit 8    PT Start Time 1696    PT Stop Time 1345    PT Time Calculation (min) 40 min              Past Medical History:  Diagnosis Date   Arthritis    Bronchitis    History of kidney stones    Hypertension    Obesity    Sickle cell trait ([place])    Past Surgical History:  Procedure Laterality Date   ABDOMINAL HYSTERECTOMY N/A 07/10/2014   Procedure: HYSTERECTOMY ABDOMINAL;  Surgeon: Jonnie Kind, MD;  [facility]: [facility];  Service: Gynecology;  Laterality: N/A;   BACK SURGERY     CATARACT EXTRACTION W/PHACO Left 10/10/2018   Procedure: CATARACT EXTRACTION PHACO AND INTRAOCULAR LENS PLACEMENT ([place]);  Surgeon: Tonny Branch, MD;  Location: [facility];  Service: Ophthalmology;  Laterality: Left;  CDE: 2.63   CATARACT EXTRACTION W/PHACO Right 10/27/2018   Procedure: CATARACT EXTRACTION PHACO AND INTRAOCULAR LENS PLACEMENT RIGHT EYE ;  Surgeon: Tonny Branch, MD;  Location: [facility];  Service: Ophthalmology;  Laterality: Right;  right   COLONOSCOPY N/A 07/21/2013   Procedure: COLONOSCOPY;  Surgeon: Danie Binder, MD;  [facility];  Service: Endoscopy;  Laterality: N/A;  10:15 AM   CYST EXCISION Right    knee   CYSTOSCOPY W/ URETERAL STENT PLACEMENT Right 02/18/2022   Procedure: CYSTOSCOPY WITH RETROGRADE PYELOGRAM/URETERAL STENT PLACEMENT;  Surgeon: Alexis Frock, MD;  [facility];  Service: Urology;  Laterality: Right;   HOLMIUM LASER APPLICATION Right 7/89/3810   Procedure: HOLMIUM LASER APPLICATION;  Surgeon: Alexis Frock, MD;  [facility];  Service: Urology;  Laterality: Right;   NEPHROLITHOTOMY Right 02/18/2022   Procedure: FIRST STAGE NEPHROLITHOTOMY PERCUTANEOUS WITH SURGEON ACCESS;  Surgeon: Alexis Frock, MD;  [facility];  Service: Urology;  Laterality: Right;  3 HRS   NEPHROLITHOTOMY Right 02/20/2022   Procedure: SECOND STAGENEPHROLITHOTOMY PERCUTANEOUS, NEPHROSTOMY TUBE CHANGE;  Surgeon: Alexis Frock, MD;  [facility];  Service: Urology;  Laterality: Right;  3 HRS   NEPHROLITHOTOMY  02/23/2022   Procedure: NEPHROLITHOTOMY PERCUTANEOUS THIRD STAGE AND STENT PLACEMENT;  Surgeon: Alexis Frock, MD;  Location: [facility];  Service: Urology;;   SALPINGOOPHORECTOMY Bilateral 07/10/2014   Procedure: SALPINGO OOPHORECTOMY;  Surgeon: Jonnie Kind, MD;  Location: [facility];  Service: Gynecology;  Laterality: Bilateral;   TUBAL LIGATION     WRIST SURGERY     Patient Active Problem List   Diagnosis Date Noted   Staghorn calculus 02/18/2022   Trichomoniasis 08/29/2014   Status post total hysterectomy and bilateral salpingo-oophorectomy 07/13/2014   S/P hysterectomy with oophorectomy 07/10/2014   H N P-LUMBAR 02/24/2010   BACK PAIN 02/24/2010   HIGH BLOOD PRESSURE 02/20/2010    PCP: Dr Armandina Gemma at [place] PROVIDER: Armond Hang   REFERRING DIAG: PT eval/tx for [id number] posterior tibial tendinitis lt leg per Deepak Ramanathan,MD   THERAPY  DIAG:  Pain in left foot  Muscle weakness (generalized)  Other abnormalities of gait and mobility  Pes planus, unspecified laterality  Rationale for Evaluation and Treatment Rehabilitation  ONSET DATE: 2021  SUBJECTIVE:   SUBJECTIVE STATEMENT:  Pt states that she is having the most pain in her back of her heel and towards the middle of her foot (plantar).  Rates at 5/10.  Reports she continues to hurt down her whole left leg at times.  Reports she plans on following up with MD regarding getting a shoe or inserts  made.  PERTINENT HISTORY: Left plantar fascitis, tendinitis and bone spur rubbing on left achilles.    History of sciatica on left LE. States she has it in the morning. Able to loosen up and start moving wihtout too much issue.  [name] is a 61 y.o. female with history of nephrolithiasis status post nephrolithotomy on 02/23/2022 who presents to the emergency department today with right-sided leg and foot pain started roughly 3 days ago.  Patient states she struck the back of her heel against a desk 3 days ago and has been having progressive pain since then.  She has not taken any thing for her pain.  Pain radiates up the right leg.  She does report associated leg swelling and shortness of breath which she describes as a sensation of having to take a deep breath.  She denies chest pain, fever, chills, hormone replacement use, recent travel.  Patient takes baby aspirin daily.  No other anticoagulation.   PAIN:  Are you having pain? Yes: NPRS scale: 7/10 Pain location: bottom of left foot running along entire bottom up into heel Pain description: burning and throbbing Aggravating factors: standing for awhile Relieving factors: gabapentin, Celebrex. Finds some relief with gabapentin and heel spur    PRECAUTIONS: Fall  PATIENT GOALS to decrease foot and ankle pain. To be able to walk around.    OBJECTIVE:   DIAGNOSTIC FINDINGS: FINDINGS: Moderate to large calcaneal heel spur. Mild dorsal degenerative spurring at the tarsometatarsal joints diffusely on lateral view. Moderate second and mild first tarsometatarsal joint space narrowing on frontal and oblique view. No acute fracture or dislocation.  PATIENT SURVEYS:  FOTO 51  PALPATION: TTP proximal to left achilles tendon Left pes planus  LOWER EXTREMITY MMT:  MMT Right eval Left eval  Hip flexion 3 3  Hip extension    Hip abduction 3 3  Hip adduction 3+ 3+  Hip internal rotation    Hip external rotation    Knee  flexion 3+ 3+  Knee extension 4 3+  Ankle dorsiflexion 4 4  Ankle plantarflexion    Ankle inversion 4 4  Ankle eversion 4 4   (Blank rows = not tested)   FUNCTIONAL TESTS:  2 minute walk test: 296'  GAIT: Distance walked: 296' on 19mt Assistive device utilized: None Level of assistance: Complete Independence Comments: decreased cadence, left foot pronation, decreased step length    TODAY'S TREATMENT: 8/08:  Standing:  Slant board stretch 3 x 30"                             Plantar stretch 3 x 30" Lt only                             Single leg stance with once finger support x 5 each LE     Tandem  stance 5X each LE lead    Vector stance 5X3" with 1 HHA    Heelraises 15X           Sitting:       Sit to stand x 10 standard chair no UE's    LAQ 10X3" each LE            7/26:  Standing:  Gastroc stretch 3 x 30"                             Plantar stretch 3 x 30"                             Single leg stance with once finger support x 10            Sitting:      sit to stand x 10                             Great toe extension 3 x 30"           Supine:     Attempted bridge but to painful;  hamstring set LT x 10                            Ab and glut set combination x 10                            Active hamstring stretch with ankle pumps x 3 x 30"                               PATIENT EDUCATION:  Education details: Patient educated on exam findings Person educated: Patient Education method: Consulting civil engineer, Media planner, and Handouts Education comprehension: verbalized understanding, returned demonstration, verbal cues required, and tactile cues required    HOME EXERCISE PROGRAM: Initiate next session  ASSESSMENT:  CLINICAL IMPRESSION: Began with stretches for gastroc and plantar fascia.  Pt able to achieve good stretch with both.  Pt with most difficulty with single leg challenges.  Added vectors to help improve stability in LE's.  Noted weakness in bil quads as well  with added strengthening.  Hyperextension with Lt knee, less than full with Rt with activity.  Pt will continue to benefit from skilled therapy to progress towards goals and reduce impairments.  Pt to f/u regarding orthotics/shoe consult with MD.   OBJECTIVE IMPAIRMENTS Abnormal gait, decreased activity tolerance, decreased balance, decreased endurance, decreased mobility, difficulty walking, decreased strength, obesity, and pain.   ACTIVITY LIMITATIONS standing, squatting, stairs, and locomotion level  PARTICIPATION LIMITATIONS: cleaning, laundry, shopping, community activity, and yard work  PERSONAL FACTORS Fitness and 3+ comorbidities: Arthritis, Back pain, BMI over 30, Headaches, High Blood Pressure, Prior Surgery  are also affecting patient's functional outcome.   REHAB POTENTIAL: Fair pes planus, foot pronation, 2 year history of pain  CLINICAL DECISION MAKING: Stable/uncomplicated  EVALUATION COMPLEXITY: Moderate   GOALS: Goals reviewed with patient? No  SHORT TERM GOALS: Target date: 09/04/2022  Patient will be independent with HEP in order to improve functional outcomes. Baseline:  Goal status: IN PROGRESS  2.  Patient will report at least 35% improvement in symptoms for improved quality of life. Baseline:  Goal status: IN PROGRESS  4.  Patient will tolerate walking 1 mile before needing rest or being limited by onset of foot pain to work towards healthier unrestricted lifestyle.  Baseline: 1/2 mile Goal status: IN PROGRESS   5.  Patient will improve FOTO score by at least 10 points in order to indicate improved tolerance to activity. Baseline: 51 Goal status: IN PROGRESS  6..  Patient will b/l LE strength in all restricted planes for improved ability to ambulate longer distance and tolerate being on feet longer times. Baseline: MMT Right eval Left eval  Hip flexion 3 3  Hip extension    Hip abduction 3 3  Hip adduction 3+ 3+  Hip internal rotation    Hip  external rotation    Knee flexion 3+ 3+  Knee extension 4 3+  Ankle dorsiflexion 4 4  Ankle plantarflexion    Ankle inversion 4 4  Ankle eversion 4 4    Goal status: IN PROGRESS  7.  Patient will be able to ambulate at least 400 feet in 2MWT in order to demonstrate improved tolerance to activity. Baseline: 296' Goal status: IN PROGRESS    PLAN: PT FREQUENCY: 1x/week  PT DURATION: 8 weeks  PLANNED INTERVENTIONS: PLANNED INTERVENTIONS: Therapeutic exercises, Therapeutic activity, Neuromuscular re-education, Balance training, Gait training, Patient/Family education, Joint manipulation, Joint mobilization, Stair training, Orthotic/Fit training, DME instructions, Aquatic Therapy, Dry Needling, Electrical stimulation, Spinal manipulation, Spinal mobilization, Cryotherapy, Moist heat, Compression bandaging, scar mobilization, Splintting, Taping, Traction, Ultrasound, Ionotophoresis '4mg'$ /ml Dexamethasone, and Manual therapy   PLAN FOR NEXT SESSION:Continue with stretching and strengthening.    Teena Irani, PTA/CLT [place] Ph: [phone] 08/04/2022, 11:10  AM

## 2022-08-11 ENCOUNTER — Encounter (HOSPITAL_COMMUNITY): Payer: Self-pay | Admitting: Physical Therapy

## 2022-08-11 ENCOUNTER — Ambulatory Visit (HOSPITAL_COMMUNITY): Payer: Commercial Managed Care - PPO | Admitting: Physical Therapy

## 2022-08-11 DIAGNOSIS — M6281 Muscle weakness (generalized): Secondary | ICD-10-CM

## 2022-08-11 DIAGNOSIS — M2140 Flat foot [pes planus] (acquired), unspecified foot: Secondary | ICD-10-CM

## 2022-08-11 DIAGNOSIS — R2689 Other abnormalities of gait and mobility: Secondary | ICD-10-CM

## 2022-08-11 DIAGNOSIS — M79672 Pain in left foot: Secondary | ICD-10-CM | POA: Diagnosis not present

## 2022-08-11 NOTE — Therapy (Signed)
OUTPATIENT PHYSICAL THERAPY LOWER EXTREMITY Treatment.   Patient Name: [name] MRN: [id number] DOB:01-19-1961, 61 y.o., female Today's Date: 08/11/2022   PT End of Session - 08/11/22 1035     Visit Number 4    Number of Visits 8    Date for PT Re-Evaluation 09/04/22    Authorization Type UMR/ [employer] PPO (no auth req/ 30 visit limit hard max)    Authorization - Visit Number 4    Authorization - Number of Visits 29    Progress Note Due on Visit 8    PT Start Time 2951    PT Stop Time 1115    PT Time Calculation (min) 40 min    Activity Tolerance Patient tolerated treatment well    Behavior During Therapy WFL for tasks assessed/performed              Past Medical History:  Diagnosis Date   Arthritis    Bronchitis    History of kidney stones    Hypertension    Obesity    Sickle cell trait ([place])    Past Surgical History:  Procedure Laterality Date   ABDOMINAL HYSTERECTOMY N/A 07/10/2014   Procedure: HYSTERECTOMY ABDOMINAL;  Surgeon: Jonnie Kind, MD;  Location: [facility];  Service: Gynecology;  Laterality: N/A;   BACK SURGERY     CATARACT EXTRACTION W/PHACO Left 10/10/2018   Procedure: CATARACT EXTRACTION PHACO AND INTRAOCULAR LENS PLACEMENT ([place]);  Surgeon: Tonny Branch, MD;  Location: [facility];  Service: Ophthalmology;  Laterality: Left;  CDE: 2.63   CATARACT EXTRACTION W/PHACO Right 10/27/2018   Procedure: CATARACT EXTRACTION PHACO AND INTRAOCULAR LENS PLACEMENT RIGHT EYE ;  Surgeon: Tonny Branch, MD;  Location: [facility];  Service: Ophthalmology;  Laterality: Right;  right   COLONOSCOPY N/A 07/21/2013   Procedure: COLONOSCOPY;  Surgeon: Danie Binder, MD;  [facility];  Service: Endoscopy;  Laterality: N/A;  10:15 AM   CYST EXCISION Right    knee   CYSTOSCOPY W/ URETERAL STENT PLACEMENT Right 02/18/2022   Procedure: CYSTOSCOPY WITH RETROGRADE PYELOGRAM/URETERAL STENT PLACEMENT;  Surgeon: Alexis Frock, MD;  [facility]: [facility];  Service: Urology;   Laterality: Right;   HOLMIUM LASER APPLICATION Right 8/84/1660   Procedure: HOLMIUM LASER APPLICATION;  Surgeon: Alexis Frock, MD;  [facility]: [facility];  Service: Urology;  Laterality: Right;   NEPHROLITHOTOMY Right 02/18/2022   Procedure: FIRST STAGE NEPHROLITHOTOMY PERCUTANEOUS WITH SURGEON ACCESS;  Surgeon: Alexis Frock, MD;  [facility];  Service: Urology;  Laterality: Right;  3 HRS   NEPHROLITHOTOMY Right 02/20/2022   Procedure: SECOND STAGENEPHROLITHOTOMY PERCUTANEOUS, NEPHROSTOMY TUBE CHANGE;  Surgeon: Alexis Frock, MD;  [facility];  Service: Urology;  Laterality: Right;  3 HRS   NEPHROLITHOTOMY  02/23/2022   Procedure: NEPHROLITHOTOMY PERCUTANEOUS THIRD STAGE AND STENT PLACEMENT;  Surgeon: Alexis Frock, MD;  [facility];  Service: Urology;;   SALPINGOOPHORECTOMY Bilateral 07/10/2014   Procedure: SALPINGO OOPHORECTOMY;  Surgeon: Jonnie Kind, MD;  Location: [facility];  Service: Gynecology;  Laterality: Bilateral;   TUBAL LIGATION     WRIST SURGERY     Patient Active Problem List   Diagnosis Date Noted   Staghorn calculus 02/18/2022   Trichomoniasis 08/29/2014   Status post total hysterectomy and bilateral salpingo-oophorectomy 07/13/2014   S/P hysterectomy with oophorectomy 07/10/2014   H N P-LUMBAR 02/24/2010   BACK PAIN 02/24/2010   HIGH BLOOD PRESSURE 02/20/2010    PCP: Dr Armandina Gemma at [place]: [name]  REFERRING DIAG: PT eval/tx for M76.822 posterior tibial tendinitis lt leg per Renee Ramanathan,MD   THERAPY DIAG:  Pain in left foot  Muscle weakness (generalized)  Other abnormalities of gait and mobility  Pes planus, unspecified laterality  Rationale for Evaluation and Treatment Rehabilitation  ONSET DATE: 2021  SUBJECTIVE:   SUBJECTIVE STATEMENT:  Pt states she hasn't really noticed a difference in symptoms but it hasnt made it worse.   PERTINENT HISTORY: Left plantar fascitis, tendinitis and bone  spur rubbing on left achilles.    History of sciatica on left LE. States she has it in the morning. Able to loosen up and start moving wihtout too much issue.  [name] is a 61 y.o. female with history of nephrolithiasis status post nephrolithotomy on 02/23/2022 who presents to the emergency department today with right-sided leg and foot pain started roughly 3 days ago.  Patient states she struck the back of her heel against a desk 3 days ago and has been having progressive pain since then.  She has not taken any thing for her pain.  Pain radiates up the right leg.  She does report associated leg swelling and shortness of breath which she describes as a sensation of having to take a deep breath.  She denies chest pain, fever, chills, hormone replacement use, recent travel.  Patient takes baby aspirin daily.  No other anticoagulation.   PAIN:  Are you having pain? Yes: NPRS scale: 7/10 Pain location: bottom of left foot running along entire bottom up into heel Pain description: burning and throbbing Aggravating factors: standing for awhile Relieving factors: gabapentin, Celebrex. Finds some relief with gabapentin and heel spur    PRECAUTIONS: Fall  PATIENT GOALS to decrease foot and ankle pain. To be able to walk around.    OBJECTIVE:   DIAGNOSTIC FINDINGS: FINDINGS: Moderate to large calcaneal heel spur. Mild dorsal degenerative spurring at the tarsometatarsal joints diffusely on lateral view. Moderate second and mild first tarsometatarsal joint space narrowing on frontal and oblique view. No acute fracture or dislocation.  PATIENT SURVEYS:  FOTO 51  PALPATION: TTP proximal to left achilles tendon Left pes planus  LOWER EXTREMITY MMT:  MMT Right eval Left eval  Hip flexion 3 3  Hip extension    Hip abduction 3 3  Hip adduction 3+ 3+  Hip internal rotation    Hip external rotation    Knee flexion 3+ 3+  Knee extension 4 3+  Ankle dorsiflexion 4 4  Ankle  plantarflexion    Ankle inversion 4 4  Ankle eversion 4 4   (Blank rows = not tested)   FUNCTIONAL TESTS:  2 minute walk test: 296'  GAIT: Distance walked: 296' on 22mt Assistive device utilized: None Level of assistance: Complete Independence Comments: decreased cadence, left foot pronation, decreased step length    TODAY'S TREATMENT: 08/11/22 Standing calf stretch on incline board 3 x 30 second holds Standing HR on slope 2x 10  Ankle band inv/ev/PF RTB 1 x 10  Ankle DF isometric 1 x 10 5 second holds Step up 4 inch 2x 10 LLE Standing hip abduction RTB at knees 2x 10 bilateral     8/08:  Standing:  Slant board stretch 3 x 30"                             Plantar stretch 3 x 30" Lt only  Single leg stance with once finger support x 5 each LE     Tandem stance 5X each LE lead    Vector stance 5X3" with 1 HHA    Heelraises 15X           Sitting:       Sit to stand x 10 standard chair no UE's    LAQ 10X3" each LE            7/26:  Standing:  Gastroc stretch 3 x 30"                             Plantar stretch 3 x 30"                             Single leg stance with once finger support x 10            Sitting:      sit to stand x 10                             Great toe extension 3 x 30"           Supine:     Attempted bridge but to painful;  hamstring set LT x 10                            Ab and glut set combination x 10                            Active hamstring stretch with ankle pumps x 3 x 30"                               PATIENT EDUCATION:  Education details: Patient educated on exam findings Person educated: Patient Education method: Consulting civil engineer, Demonstration, and Handouts Education comprehension: verbalized understanding, returned demonstration, verbal cues required, and tactile cues required    HOME EXERCISE PROGRAM: Access Code: [email or web address] URL: [email or web address] Date: 08/11/2022 - Ankle Inversion with  Anchored Resistance at Table  - 2 x daily - 7 x weekly - 2 sets - 10 reps - Seated Ankle Eversion with Resistance  - 1 x daily - 7 x weekly - 2 sets - 10 reps - Isometric Ankle Dorsiflexion and Plantarflexion  - 2 x daily - 7 x weekly - 10 reps - 5 second hold  ASSESSMENT:  CLINICAL IMPRESSION: Began session with standing LE stretches. Patient with limited ROM with standing heel raises and c/o foot symptoms. Added resisted ankle exercises with improvement in symptoms following. She is given intermittent cueing for LE stabilization. Tolerates additional LE strengthening with moderate fatigue noted. Patient will continue to benefit from physical therapy in order to improve function and reduce impairment.   OBJECTIVE IMPAIRMENTS Abnormal gait, decreased activity tolerance, decreased balance, decreased endurance, decreased mobility, difficulty walking, decreased strength, obesity, and pain.   ACTIVITY LIMITATIONS standing, squatting, stairs, and locomotion level  PARTICIPATION LIMITATIONS: cleaning, laundry, shopping, community activity, and yard work  PERSONAL FACTORS Fitness and 3+ comorbidities: Arthritis, Back pain, BMI over 30, Headaches, High Blood Pressure, Prior Surgery  are also affecting patient's functional outcome.   REHAB POTENTIAL: Fair pes planus, foot pronation, 2 year history of pain  CLINICAL DECISION MAKING: Stable/uncomplicated  EVALUATION COMPLEXITY: Moderate   GOALS: Goals reviewed with patient? No  SHORT TERM GOALS: Target date: 09/04/2022  Patient will be independent with HEP in order to improve functional outcomes. Baseline:  Goal status: IN PROGRESS  2.  Patient will report at least 35% improvement in symptoms for improved quality of life. Baseline:  Goal status: IN PROGRESS  4.  Patient will tolerate walking 1 mile before needing rest or being limited by onset of foot pain to work towards healthier unrestricted lifestyle.  Baseline: 1/2 mile Goal status:  IN PROGRESS   5.  Patient will improve FOTO score by at least 10 points in order to indicate improved tolerance to activity. Baseline: 51 Goal status: IN PROGRESS  6..  Patient will b/l LE strength in all restricted planes for improved ability to ambulate longer distance and tolerate being on feet longer times. Baseline: MMT Right eval Left eval  Hip flexion 3 3  Hip extension    Hip abduction 3 3  Hip adduction 3+ 3+  Hip internal rotation    Hip external rotation    Knee flexion 3+ 3+  Knee extension 4 3+  Ankle dorsiflexion 4 4  Ankle plantarflexion    Ankle inversion 4 4  Ankle eversion 4 4    Goal status: IN PROGRESS  7.  Patient will be able to ambulate at least 400 feet in 2MWT in order to demonstrate improved tolerance to activity. Baseline: 296' Goal status: IN PROGRESS    PLAN: PT FREQUENCY: 1x/week  PT DURATION: 8 weeks  PLANNED INTERVENTIONS: PLANNED INTERVENTIONS: Therapeutic exercises, Therapeutic activity, Neuromuscular re-education, Balance training, Gait training, Patient/Family education, Joint manipulation, Joint mobilization, Stair training, Orthotic/Fit training, DME instructions, Aquatic Therapy, Dry Needling, Electrical stimulation, Spinal manipulation, Spinal mobilization, Cryotherapy, Moist heat, Compression bandaging, scar mobilization, Splintting, Taping, Traction, Ultrasound, Ionotophoresis '4mg'$ /ml Dexamethasone, and Manual therapy   PLAN FOR NEXT SESSION:Continue with stretching and strengthening.   10:38 AM, 08/11/22 Mearl Latin PT, DPT Physical Therapist at [facility]

## 2022-08-18 ENCOUNTER — Encounter (HOSPITAL_COMMUNITY): Payer: Commercial Managed Care - PPO | Admitting: Physical Therapy

## 2022-08-25 ENCOUNTER — Encounter (HOSPITAL_COMMUNITY): Payer: Self-pay

## 2022-08-25 ENCOUNTER — Ambulatory Visit (HOSPITAL_COMMUNITY): Payer: Commercial Managed Care - PPO

## 2022-08-25 DIAGNOSIS — M79672 Pain in left foot: Secondary | ICD-10-CM | POA: Diagnosis not present

## 2022-08-25 DIAGNOSIS — R2689 Other abnormalities of gait and mobility: Secondary | ICD-10-CM

## 2022-08-25 DIAGNOSIS — M6281 Muscle weakness (generalized): Secondary | ICD-10-CM

## 2022-08-25 NOTE — Therapy (Signed)
OUTPATIENT PHYSICAL THERAPY LOWER EXTREMITY Treatment.   Patient Name: [name] MRN: [id number] DOB:Jul 12, 1961, 61 y.o., female Today's Date: 08/25/2022   PT End of Session - 08/25/22 1122     Visit Number 5    Number of Visits 8    Date for PT Re-Evaluation 09/04/22    Authorization Type UMR/ UHC PPO (no auth req/ 30 visit limit hard max)    Authorization - Visit Number 5    Authorization - Number of Visits 29    Progress Note Due on Visit 8    PT Start Time 1036    PT Stop Time 1121    PT Time Calculation (min) 45 min    Activity Tolerance Patient limited by pain;Patient tolerated treatment well    Behavior During Therapy [facility] for tasks assessed/performed               Past Medical History:  Diagnosis Date   Arthritis    Bronchitis    History of kidney stones    Hypertension    Obesity    Sickle cell trait ([place])    Past Surgical History:  Procedure Laterality Date   ABDOMINAL HYSTERECTOMY N/A 07/10/2014   Procedure: HYSTERECTOMY ABDOMINAL;  Surgeon: Jonnie Kind, MD;  Location: [facility];  Service: Gynecology;  Laterality: N/A;   BACK SURGERY     CATARACT EXTRACTION W/PHACO Left 10/10/2018   Procedure: CATARACT EXTRACTION PHACO AND INTRAOCULAR LENS PLACEMENT ([place]);  Surgeon: Tonny Branch, MD;  Location: [facility];  Service: Ophthalmology;  Laterality: Left;  CDE: 2.63   CATARACT EXTRACTION W/PHACO Right 10/27/2018   Procedure: CATARACT EXTRACTION PHACO AND INTRAOCULAR LENS PLACEMENT RIGHT EYE ;  Surgeon: Tonny Branch, MD;  Location: [facility];  Service: Ophthalmology;  Laterality: Right;  right   COLONOSCOPY N/A 07/21/2013   Procedure: COLONOSCOPY;  Surgeon: Danie Binder, MD;  [facility];  Service: Endoscopy;  Laterality: N/A;  10:15 AM   CYST EXCISION Right    knee   CYSTOSCOPY W/ URETERAL STENT PLACEMENT Right 02/18/2022   Procedure: CYSTOSCOPY WITH RETROGRADE PYELOGRAM/URETERAL STENT PLACEMENT;  Surgeon: Alexis Frock, MD;  [facility];   Service: Urology;  Laterality: Right;   HOLMIUM LASER APPLICATION Right 04/22/8340   Procedure: HOLMIUM LASER APPLICATION;  Surgeon: Alexis Frock, MD;  [facility];  Service: Urology;  Laterality: Right;   NEPHROLITHOTOMY Right 02/18/2022   Procedure: FIRST STAGE NEPHROLITHOTOMY PERCUTANEOUS WITH SURGEON ACCESS;  Surgeon: Alexis Frock, MD;  [facility];  Service: Urology;  Laterality: Right;  3 HRS   NEPHROLITHOTOMY Right 02/20/2022   Procedure: SECOND STAGENEPHROLITHOTOMY PERCUTANEOUS, NEPHROSTOMY TUBE CHANGE;  Surgeon: Alexis Frock, MD;  [facility];  Service: Urology;  Laterality: Right;  3 HRS   NEPHROLITHOTOMY  02/23/2022   Procedure: NEPHROLITHOTOMY PERCUTANEOUS THIRD STAGE AND STENT PLACEMENT;  Surgeon: Alexis Frock, MD;  [facility];  Service: Urology;;   SALPINGOOPHORECTOMY Bilateral 07/10/2014   Procedure: SALPINGO OOPHORECTOMY;  Surgeon: Jonnie Kind, MD;  Location: [facility];  Service: Gynecology;  Laterality: Bilateral;   TUBAL LIGATION     WRIST SURGERY     Patient Active Problem List   Diagnosis Date Noted   Staghorn calculus 02/18/2022   Trichomoniasis 08/29/2014   Status post total hysterectomy and bilateral salpingo-oophorectomy 07/13/2014   S/P hysterectomy with oophorectomy 07/10/2014   H N P-LUMBAR 02/24/2010   BACK PAIN 02/24/2010   HIGH BLOOD PRESSURE 02/20/2010    PCP: [facility]ina Gemma at [facility]  REFERRING PROVIDER: Armond Hang   REFERRING DIAG: PT eval/tx for [phone] posterior tibial tendinitis lt leg per Vernon Prey Ramanathan,MD   THERAPY DIAG:  Pain in left foot  Muscle weakness (generalized)  Other abnormalities of gait and mobility  Rationale for Evaluation and Treatment Rehabilitation  ONSET DATE: 2021  SUBJECTIVE:   SUBJECTIVE STATEMENT:   Pt reports a recent death in family last week and admits decreased compliance with HEP.  Reports sharp on lateral and middle part of Lt foot 7/10, ice helps with swelling  and pain.  Rt knee has been bothering her more for the last 3 days pain scale 9/10.  Feels therapy is helping some.  Decreased standing tolerance for standing, attempted to cook and required to have a seat.  Has began the theraband exercises at home.    PERTINENT HISTORY: Left plantar fascitis, tendinitis and bone spur rubbing on left achilles.    History of sciatica on left LE. States she has it in the morning. Able to loosen up and start moving wihtout too much issue.  [name] is a 61 y.o. female with history of nephrolithiasis status post nephrolithotomy on 02/23/2022 who presents to the emergency department today with right-sided leg and foot pain started roughly 3 days ago.  Patient states she struck the back of her heel against a desk 3 days ago and has been having progressive pain since then.  She has not taken any thing for her pain.  Pain radiates up the right leg.  She does report associated leg swelling and shortness of breath which she describes as a sensation of having to take a deep breath.  She denies chest pain, fever, chills, hormone replacement use, recent travel.  Patient takes baby aspirin daily.  No other anticoagulation.   PAIN:  Are you having pain? Yes: NPRS scale: 7/10 Pain location: bottom of left foot running along entire bottom up into heel Pain description: burning and throbbing Aggravating factors: standing for awhile Relieving factors: gabapentin, Celebrex. Finds some relief with gabapentin and heel spur    PRECAUTIONS: Fall  PATIENT GOALS to decrease foot and ankle pain. To be able to walk around.    OBJECTIVE:   DIAGNOSTIC FINDINGS: FINDINGS: Moderate to large calcaneal heel spur. Mild dorsal degenerative spurring at the tarsometatarsal joints diffusely on lateral view. Moderate second and mild first tarsometatarsal joint space narrowing on frontal and oblique view. No acute fracture or dislocation.  PATIENT SURVEYS:  FOTO 51  PALPATION: TTP  proximal to left achilles tendon Left pes planus  LOWER EXTREMITY MMT:  MMT Right eval Left eval  Hip flexion 3 3  Hip extension    Hip abduction 3 3  Hip adduction 3+ 3+  Hip internal rotation    Hip external rotation    Knee flexion 3+ 3+  Knee extension 4 3+  Ankle dorsiflexion 4 4  Ankle plantarflexion    Ankle inversion 4 4  Ankle eversion 4 4   (Blank rows = not tested)  AROM Dorsiflexion 15 degrees Plantar flexion 18 degrees Inversion 35 degrees Eversion 10 degrees   FUNCTIONAL TESTS:  2 minute walk test: 296'  GAIT: Distance walked: 296' on 94mt Assistive device utilized: None Level of assistance: Complete Independence Comments: decreased cadence, left foot pronation, decreased step length    TODAY'S TREATMENT: 08/25/22:  AROM Dorsiflexion 15 degrees Plantar flexion 28 degrees Inversion 35 degrees Eversion 10 degrees  Standing heel raise/toe raise on slope 2x 10  Rockerboard 271m lateral/ DF/PF Hip  abduction GTB on thigh 10x C/o lightheadedness, sat down, water vital taken  Seated arch formation Towel scrunch  08/11/22 Standing calf stretch on incline board 3 x 30 second holds Standing HR on slope 2x 10  Ankle band inv/ev/PF RTB 1 x 10  Ankle DF isometric 1 x 10 5 second holds Step up 4 inch 2x 10 LLE Standing hip abduction RTB at knees 2x 10 bilateral     8/08:  Standing:  Slant board stretch 3 x 30"                             Plantar stretch 3 x 30" Lt only                             Single leg stance with once finger support x 5 each LE     Tandem stance 5X each LE lead    Vector stance 5X3" with 1 HHA    Heelraises 15X           Sitting:       Sit to stand x 10 standard chair no UE's    LAQ 10X3" each LE            7/26:  Standing:  Gastroc stretch 3 x 30"                             Plantar stretch 3 x 30"                             Single leg stance with once finger support x 10            Sitting:      sit to stand x 10                              Great toe extension 3 x 30"           Supine:     Attempted bridge but to painful;  hamstring set LT x 10                            Ab and glut set combination x 10                            Active hamstring stretch with ankle pumps x 3 x 30"                               PATIENT EDUCATION:  Education details: Patient educated on exam findings Person educated: Patient Education method: Consulting civil engineer, Demonstration, and Handouts Education comprehension: verbalized understanding, returned demonstration, verbal cues required, and tactile cues required    HOME EXERCISE PROGRAM: Access Code: [email or web address] URL: [email or web address] Date: 08/11/2022 - Ankle Inversion with Anchored Resistance at Table  - 2 x daily - 7 x weekly - 2 sets - 10 reps - Seated Ankle Eversion with Resistance  - 1 x daily - 7 x weekly - 2 sets - 10 reps - Isometric Ankle Dorsiflexion and Plantarflexion  - 2 x daily - 7 x weekly - 10 reps - 5 second  hold  ASSESSMENT:  CLINICAL IMPRESSION: Added rockerboard to equalize weight bearing with gait for pain control.  Noted decreased arch formation Lt foot, educated importance of intrinsic strengthening for foot strength.  Encouraged to go to shoe store that assesses proper shoe and possible orthotics to improve foot, knee, hip alignment.  Pt tolerated well to session. AROM measured with good dorsiflexion, continues to be limited with plantar and eversion motion.  Added intrinsic exercises to HEP with print out given.    OBJECTIVE IMPAIRMENTS Abnormal gait, decreased activity tolerance, decreased balance, decreased endurance, decreased mobility, difficulty walking, decreased strength, obesity, and pain.   ACTIVITY LIMITATIONS standing, squatting, stairs, and locomotion level  PARTICIPATION LIMITATIONS: cleaning, laundry, shopping, community activity, and yard work  PERSONAL FACTORS Fitness and 3+ comorbidities: Arthritis, Back pain, BMI  over 30, Headaches, High Blood Pressure, Prior Surgery  are also affecting patient's functional outcome.   REHAB POTENTIAL: Fair pes planus, foot pronation, 2 year history of pain  CLINICAL DECISION MAKING: Stable/uncomplicated  EVALUATION COMPLEXITY: Moderate   GOALS: Goals reviewed with patient? No  SHORT TERM GOALS: Target date: 09/04/2022  Patient will be independent with HEP in order to improve functional outcomes. Baseline:  Goal status: IN PROGRESS  2.  Patient will report at least 35% improvement in symptoms for improved quality of life. Baseline:  Goal status: IN PROGRESS  4.  Patient will tolerate walking 1 mile before needing rest or being limited by onset of foot pain to work towards healthier unrestricted lifestyle.  Baseline: 1/2 mile Goal status: IN PROGRESS   5.  Patient will improve FOTO score by at least 10 points in order to indicate improved tolerance to activity. Baseline: 51 Goal status: IN PROGRESS  6..  Patient will b/l LE strength in all restricted planes for improved ability to ambulate longer distance and tolerate being on feet longer times. Baseline: MMT Right eval Left eval  Hip flexion 3 3  Hip extension    Hip abduction 3 3  Hip adduction 3+ 3+  Hip internal rotation    Hip external rotation    Knee flexion 3+ 3+  Knee extension 4 3+  Ankle dorsiflexion 4 4  Ankle plantarflexion    Ankle inversion 4 4  Ankle eversion 4 4    Goal status: IN PROGRESS  7.  Patient will be able to ambulate at least 400 feet in 2MWT in order to demonstrate improved tolerance to activity. Baseline: 296' Goal status: IN PROGRESS    PLAN: PT FREQUENCY: 1x/week  PT DURATION: 8 weeks  PLANNED INTERVENTIONS: PLANNED INTERVENTIONS: Therapeutic exercises, Therapeutic activity, Neuromuscular re-education, Balance training, Gait training, Patient/Family education, Joint manipulation, Joint mobilization, Stair training, Orthotic/Fit training, DME  instructions, Aquatic Therapy, Dry Needling, Electrical stimulation, Spinal manipulation, Spinal mobilization, Cryotherapy, Moist heat, Compression bandaging, scar mobilization, Splintting, Taping, Traction, Ultrasound, Ionotophoresis '4mg'$ /ml Dexamethasone, and Manual therapy   PLAN FOR NEXT SESSION:Continue with stretching and strengthening.   Ihor Austin, LPTA/CLT; CBIS [phone]  12:15 PM, 08/25/22

## 2022-09-08 ENCOUNTER — Other Ambulatory Visit (HOSPITAL_COMMUNITY): Payer: Self-pay | Admitting: Orthopaedic Surgery

## 2022-09-08 ENCOUNTER — Other Ambulatory Visit: Payer: Self-pay | Admitting: Orthopaedic Surgery

## 2022-09-08 DIAGNOSIS — M7662 Achilles tendinitis, left leg: Secondary | ICD-10-CM

## 2022-09-22 ENCOUNTER — Ambulatory Visit (HOSPITAL_COMMUNITY)
Admission: RE | Admit: 2022-09-22 | Discharge: 2022-09-22 | Disposition: A | Discharge status: Home / Self Care | Payer: Commercial Managed Care - PPO | Source: Ambulatory Visit | Attending: Orthopaedic Surgery | Admitting: Orthopaedic Surgery

## 2022-09-22 DIAGNOSIS — M7662 Achilles tendinitis, left leg: Secondary | ICD-10-CM | POA: Insufficient documentation

## 2022-12-23 ENCOUNTER — Encounter (HOSPITAL_COMMUNITY): Payer: Self-pay | Admitting: Physical Therapy

## 2022-12-23 NOTE — Therapy (Signed)
[place], [place], [address] Phone: [phone]   Fax:  [phone]  Patient Details  Name: [name] MRN: [id number] Date of Birth: 1961-12-17 Referring Provider:  No ref. provider found  Encounter Date: 12/23/2022 PHYSICAL THERAPY DISCHARGE SUMMARY  Visits from Start of Care: 5  Current functional level related to goals / functional outcomes: Unknown as pt did not return    Remaining deficits: Unknown as pt did not return    Education / Equipment: HEP   Patient agrees to discharge. Patient goals were Unknown as pt did not return . Patient is being discharged due to not returning since the last visit.   Rayetta Humphrey, PT CLT [phone]  12/23/2022, 9:40 AM  [place] [address] [place], [place], [address] Phone: [phone]   Fax:  [phone]

## 2023-03-10 ENCOUNTER — Other Ambulatory Visit (HOSPITAL_COMMUNITY): Payer: Self-pay | Admitting: Orthopaedic Surgery

## 2023-03-10 DIAGNOSIS — M76822 Posterior tibial tendinitis, left leg: Secondary | ICD-10-CM

## 2023-03-23 ENCOUNTER — Ambulatory Visit (HOSPITAL_COMMUNITY)
Admission: RE | Admit: 2023-03-23 | Discharge: 2023-03-23 | Disposition: A | Discharge status: Home / Self Care | Payer: Commercial Managed Care - PPO | Source: Ambulatory Visit | Attending: Orthopaedic Surgery | Admitting: Orthopaedic Surgery

## 2023-03-23 DIAGNOSIS — M76822 Posterior tibial tendinitis, left leg: Secondary | ICD-10-CM | POA: Insufficient documentation

## 2023-03-23 NOTE — Progress Notes (Signed)
Received a VM from Dr Susa Raring stating he would like a call back explaining why BMI cutoff is 55 and why his patient cannot be done here. I returned his call and had to leave a message explaining our guidelines here at [facility].

## 2023-03-23 NOTE — Progress Notes (Signed)
I received another call from Dr Susa Raring about his patient with a BMI>55 that he wants scheduled here at [facility]. He wanted to speak directly with Dr Valma Cava who is scheduled here today. I told him I would give his # to Dr Valma Cava and have him return his call.

## 2023-03-23 NOTE — Progress Notes (Signed)
Patient BMI computed at 55.6, per [place] guidelines cutoff is 55. Meagan at Dr Ellan Lambert office made aware.

## 2023-03-24 ENCOUNTER — Encounter (HOSPITAL_COMMUNITY): Payer: Self-pay | Admitting: Anesthesiology

## 2023-03-24 NOTE — Care Plan (Signed)
Orthopaedic Surgery Preoperative   -pt scheduled for OR 03/31/23 at [facility] -received message from [facility] Preop that patient is over BMI cutoff by 0.6 units -as of 03/24/23, pt's documented height is 5'4" with weight 324 lbs (BMI 55.6) -discussed directly with RN Elise Benne and MD Valma Cava. BMI standard error varies in literature around +/- 3.3. They advised having patient be remeasured preop to determine accurate BMI -our office has contacted patient with plan to come for preop. We will notify [facility] if BMI exceeds 55.0. Until then, we will keep patient as scheduled at [facility] and proceed as planned  Armond Hang, MD Orthopaedic Surgery [facility]

## 2023-03-25 ENCOUNTER — Other Ambulatory Visit: Payer: Self-pay

## 2023-03-25 ENCOUNTER — Encounter (HOSPITAL_BASED_OUTPATIENT_CLINIC_OR_DEPARTMENT_OTHER): Payer: Self-pay | Admitting: Orthopaedic Surgery

## 2023-03-25 NOTE — H&P (Signed)
ORTHOPAEDIC SURGERY H&P  Subjective:  The patient presents with left posterior tibialis tendinitis (distal), plantar fasciitis, gastrocnemius contracture.   Past Medical History:  Diagnosis Date   Arthritis    Bronchitis    History of kidney stones    Hypertension    Obesity    Sickle cell trait ([place])     Past Surgical History:  Procedure Laterality Date   ABDOMINAL HYSTERECTOMY N/A 07/10/2014   Procedure: HYSTERECTOMY ABDOMINAL;  Surgeon: Jonnie Kind, MD;  Location: [facility];  Service: Gynecology;  Laterality: N/A;   BACK SURGERY     CATARACT EXTRACTION W/PHACO Left 10/10/2018   Procedure: CATARACT EXTRACTION PHACO AND INTRAOCULAR LENS PLACEMENT ([place]);  Surgeon: Tonny Branch, MD;  Location: [facility];  Service: Ophthalmology;  Laterality: Left;  CDE: 2.63   CATARACT EXTRACTION W/PHACO Right 10/27/2018   Procedure: CATARACT EXTRACTION PHACO AND INTRAOCULAR LENS PLACEMENT RIGHT EYE ;  Surgeon: Tonny Branch, MD;  Location: [facility];  Service: Ophthalmology;  Laterality: Right;  right   COLONOSCOPY N/A 07/21/2013   Procedure: COLONOSCOPY;  Surgeon: Danie Binder, MD;  Location: [facility];  Service: Endoscopy;  Laterality: N/A;  10:15 AM   CYST EXCISION Right    knee   CYSTOSCOPY W/ URETERAL STENT PLACEMENT Right 02/18/2022   Procedure: CYSTOSCOPY WITH RETROGRADE PYELOGRAM/URETERAL STENT PLACEMENT;  Surgeon: Alexis Frock, MD;  Location: [facility];  Service: Urology;  Laterality: Right;   HOLMIUM LASER APPLICATION Right [id number]   Procedure: HOLMIUM LASER APPLICATION;  Surgeon: Alexis Frock, MD;  Location: [facility];  Service: Urology;  Laterality: Right;   NEPHROLITHOTOMY Right 02/18/2022   Procedure: FIRST STAGE NEPHROLITHOTOMY PERCUTANEOUS WITH SURGEON ACCESS;  Surgeon: Alexis Frock, MD;  Location: [facility];  Service: Urology;  Laterality: Right;  3 HRS   NEPHROLITHOTOMY Right 02/20/2022   Procedure: SECOND STAGENEPHROLITHOTOMY PERCUTANEOUS, NEPHROSTOMY TUBE CHANGE;  Surgeon: Alexis Frock, MD;  Location: [facility];  Service: Urology;  Laterality: Right;  3 HRS   NEPHROLITHOTOMY  02/23/2022   Procedure: NEPHROLITHOTOMY PERCUTANEOUS THIRD STAGE AND STENT PLACEMENT;  Surgeon: Alexis Frock, MD;  Location: [facility];  Service: Urology;;   SALPINGOOPHORECTOMY Bilateral 07/10/2014   Procedure: SALPINGO OOPHORECTOMY;  Surgeon: Jonnie Kind, MD;  Location: [facility];  Service: Gynecology;  Laterality: Bilateral;   TUBAL LIGATION     WRIST SURGERY       (Not in an outpatient encounter)    Allergies  Allergen Reactions   Oxycodone Shortness Of Breath    Social History   Socioeconomic History   Marital status: Married    Spouse name: Not on file   Number of children: Not on file   Years of education: Not on file   Highest education level: Not on file  Occupational History   Not on file  Tobacco Use   Smoking status: Never   Smokeless tobacco: Never  Vaping Use   Vaping Use: Never used  Substance and Sexual Activity   Alcohol use: No   Drug use: No   Sexual activity: Not Currently    Birth control/protection: Surgical  Other Topics Concern   Not on file  Social History Narrative   Not on file   Social Determinants of Health   Financial Resource Strain: Not on file  Food Insecurity: Not on file  Transportation Needs: Not on file  Physical Activity: Not on file  Stress: Not on file  Social Connections: Not on file  Intimate Partner Violence: Not on file  History reviewed. No pertinent family history.   Review of Systems Pertinent items are noted in HPI.  Objective: Vital signs in last 24 hours:    03/02/2022   12:25 PM 03/02/2022   10:00 AM 03/02/2022    8:11 AM  Vitals with BMI  Systolic [id number]  Diastolic 84 85 71  Pulse 61 63 86      EXAM: General: Well nourished, well developed. Awake, alert and oriented to time, place, person. Normal mood and affect. No apparent distress. Breathing room air.  Operative Lower Extremity: Alignment  - Neutral Deformity - None Skin intact Tenderness to palpation - left posterior tibialis tendinitis (distal), plantar fasciitis 5/5 TA, PT, GS, Per, EHL, FHL Sensation intact to light touch throughout Palpable DP and PT pulses Special testing: None  The contralateral foot/ankle was examined for comparison and noted to be neurovascularly intact with no localized deformity, swelling, or tenderness.  Imaging Review All images taken were independently reviewed by me.  Assessment/Plan: The clinical and radiographic findings were reviewed and discussed at length with the patient.  The patient has left posterior tibialis tendinitis (distal), plantar fasciitis, gastrocnemius contracture.  We spoke at length about the natural course of these findings. We discussed nonoperative and operative treatment options in detail.  The risks and benefits were presented and reviewed. The risks due to implant failure/irritation, infection, stiffness, nerve/vessel/tendon injury, wound healing issues, failure of this surgery, need for further surgery, thromboembolic events, amputation, death among others were discussed. The patient acknowledged the explanation and agreed to proceed with the plan.  [name]  Orthopaedic Surgery [facility]

## 2023-03-25 NOTE — Care Plan (Signed)
Orthopaedic Surgery Preoperative  -pt seen in outpatient office today   03/25/23 5:00 PM Ht 5'4" Wt 131.5 kg BMI  49.7  -pt was also seen within [facility] system earlier today  03/25/23 12:08 PM Ht 5'4" Wt 136.1 kg BMI 51.5  -she is getting EKG on Monday 4/1 for preop  -based on several measurements preop, the patient comfortably meets BMI cutoff for [facility]  -plan to proceed with OR on 03/31/23 at [facility] as scheduled  -message left with [facility] preop team to communicate above  Armond Hang, MD Orthopaedic Surgery [facility]

## 2023-03-25 NOTE — H&P (View-Only) (Signed)
ORTHOPAEDIC SURGERY H&P  Subjective:  The patient presents with left posterior tibialis tendinitis (distal), plantar fasciitis, gastrocnemius contracture.   Past Medical History:  Diagnosis Date   Arthritis    Bronchitis    History of kidney stones    Hypertension    Obesity    Sickle cell trait ([facility])     Past Surgical History:  Procedure Laterality Date   ABDOMINAL HYSTERECTOMY N/A 07/10/2014   Procedure: HYSTERECTOMY ABDOMINAL;  Surgeon: John Ferguson V, MD;  Location: [facility];  Service: Gynecology;  Laterality: N/A;   BACK SURGERY     CATARACT EXTRACTION W/PHACO Left 10/10/2018   Procedure: CATARACT EXTRACTION PHACO AND INTRAOCULAR LENS PLACEMENT ([facility]);  Surgeon: Hunt, Kerry, MD;  Location: [facility];  Service: Ophthalmology;  Laterality: Left;  CDE: 2.63   CATARACT EXTRACTION W/PHACO Right 10/27/2018   Procedure: CATARACT EXTRACTION PHACO AND INTRAOCULAR LENS PLACEMENT RIGHT EYE ;  Surgeon: Hunt, Kerry, MD;  Location: [facility];  Service: Ophthalmology;  Laterality: Right;  right   COLONOSCOPY N/A 07/21/2013   Procedure: COLONOSCOPY;  Surgeon: Sandi L Fields, MD;  Location[facility];  Service: Endoscopy;  Laterality: N/A;  10:15 AM   CYST EXCISION Right    knee   CYSTOSCOPY W/ URETERAL STENT PLACEMENT Right 02/18/2022   Procedure: CYSTOSCOPY WITH RETROGRADE PYELOGRAM/URETERAL STENT PLACEMENT;  Surgeon: Manny, Theodore, MD;  [facility];  Service: Urology;  Laterality: Right;   HOLMIUM LASER APPLICATION Right 02/20/2022   Procedure: HOLMIUM LASER APPLICATION;  Surgeon: Manny, Theodore, MD;  [facility];  Service: Urology;  Laterality: Right;   NEPHROLITHOTOMY Right 02/18/2022   Procedure: FIRST STAGE NEPHROLITHOTOMY PERCUTANEOUS WITH SURGEON ACCESS;  Surgeon: Manny, Theodore, MD;  [facility];  Service: Urology;  Laterality: Right;  3 HRS   NEPHROLITHOTOMY Right 02/20/2022   Procedure: SECOND STAGENEPHROLITHOTOMY PERCUTANEOUS, NEPHROSTOMY TUBE CHANGE;  Surgeon: Manny,  Theodore, MD;  [facility];  Service: Urology;  Laterality: Right;  3 HRS   NEPHROLITHOTOMY  02/23/2022   Procedure: NEPHROLITHOTOMY PERCUTANEOUS THIRD STAGE AND STENT PLACEMENT;  Surgeon: Manny, Theodore, MD;  [facility];  Service: Urology;;   SALPINGOOPHORECTOMY Bilateral 07/10/2014   Procedure: SALPINGO OOPHORECTOMY;  Surgeon: John Ferguson V, MD;  Location: [facility];  Service: Gynecology;  Laterality: Bilateral;   TUBAL LIGATION     WRIST SURGERY       (Not in an outpatient encounter)    Allergies  Allergen Reactions   Oxycodone Shortness Of Breath    Social History   Socioeconomic History   Marital status: Married    Spouse name: Not on file   Number of children: Not on file   Years of education: Not on file   Highest education level: Not on file  Occupational History   Not on file  Tobacco Use   Smoking status: Never   Smokeless tobacco: Never  Vaping Use   Vaping Use: Never used  Substance and Sexual Activity   Alcohol use: No   Drug use: No   Sexual activity: Not Currently    Birth control/protection: Surgical  Other Topics Concern   Not on file  Social History Narrative   Not on file   Social Determinants of Health   Financial Resource Strain: Not on file  Food Insecurity: Not on file  Transportation Needs: Not on file  Physical Activity: Not on file  Stress: Not on file  Social Connections: Not on file  Intimate Partner Violence: Not on file       History reviewed. No pertinent family history.   Review of Systems Pertinent items are noted in HPI.  Objective: Vital signs in last 24 hours:    03/02/2022   12:25 PM 03/02/2022   10:00 AM 03/02/2022    8:11 AM  Vitals with BMI  Systolic 125 121 131  Diastolic 84 85 71  Pulse 61 63 86      EXAM: General: Well nourished, well developed. Awake, alert and oriented to time, place, person. Normal mood and affect. No apparent distress. Breathing room air.  Operative Lower Extremity: Alignment  - Neutral Deformity - None Skin intact Tenderness to palpation - left posterior tibialis tendinitis (distal), plantar fasciitis 5/5 TA, PT, GS, Per, EHL, FHL Sensation intact to light touch throughout Palpable DP and PT pulses Special testing: None  The contralateral foot/ankle was examined for comparison and noted to be neurovascularly intact with no localized deformity, swelling, or tenderness.  Imaging Review All images taken were independently reviewed by me.  Assessment/Plan: The clinical and radiographic findings were reviewed and discussed at length with the patient.  The patient has left posterior tibialis tendinitis (distal), plantar fasciitis, gastrocnemius contracture.  We spoke at length about the natural course of these findings. We discussed nonoperative and operative treatment options in detail.  The risks and benefits were presented and reviewed. The risks due to implant failure/irritation, infection, stiffness, nerve/vessel/tendon injury, wound healing issues, failure of this surgery, need for further surgery, thromboembolic events, amputation, death among others were discussed. The patient acknowledged the explanation and agreed to proceed with the plan.  [name]  Orthopaedic Surgery [facility]  

## 2023-03-25 NOTE — Discharge Instructions (Signed)
Talyn Dessert, MD [facility]  Please read the following information regarding your care after surgery.  Medications  You only need a prescription for the narcotic pain medicine (ex. oxycodone, Percocet, Norco).  All of the other medicines listed below are available over the counter. ? Aleve 2 pills twice a day for the first 3 days after surgery. ? acetominophen (Tylenol) 650 mg every 4-6 hours as you need for minor to moderate pain ? oxycodone as prescribed for severe pain  ? To help prevent blood clots, take aspirin (81 mg) twice daily for 42 days after surgery (or total duration of nonweightbearing).  You should also get up every hour while you are awake to move around.  Weight Bearing ? Do NOT bear any weight on the operated leg or foot. This means do NOT touch your surgical leg to the ground!  Cast / Splint / Dressing ? If you have a splint, do NOT remove this. Keep your splint, cast or dressing clean and dry.  Don't put anything (coat hanger, pencil, etc) down inside of it.  If it gets wet, call the office immediately to schedule an appointment for a cast change.  Swelling IMPORTANT: It is normal for you to have swelling where you had surgery. To reduce swelling and pain, keep at least 3 pillows under your leg so that your toes are above your nose and your heel is above the level of your hip.  It may be necessary to keep your foot or leg elevated for several weeks.  This is critical to helping your incisions heal and your pain to feel better.  Follow Up Call my office at [phone] when you are discharged from the hospital or surgery center to schedule an appointment to be seen 7-10 days after surgery.  Call my office at [phone] if you develop a fever >101.5 F, nausea, vomiting, bleeding from the surgical site or severe pain.     Post Anesthesia Home Care Instructions  Activity: Get plenty of rest for the remainder of the day. A responsible individual must stay with  you for 24 hours following the procedure.  For the next 24 hours, DO NOT: -Drive a car -Operate machinery -Drink alcoholic beverages -Take any medication unless instructed by your physician -Make any legal decisions or sign important papers.  Meals: Start with liquid foods such as gelatin or soup. Progress to regular foods as tolerated. Avoid greasy, spicy, heavy foods. If nausea and/or vomiting occur, drink only clear liquids until the nausea and/or vomiting subsides. Call your physician if vomiting continues.  Special Instructions/Symptoms: Your throat may feel dry or sore from the anesthesia or the breathing tube placed in your throat during surgery. If this causes discomfort, gargle with warm salt water. The discomfort should disappear within 24 hours.  If you had a scopolamine patch placed behind your ear for the management of post- operative nausea and/or vomiting:  1. The medication in the patch is effective for 72 hours, after which it should be removed.  Wrap patch in a tissue and discard in the trash. Wash hands thoroughly with soap and water. 2. You may remove the patch earlier than 72 hours if you experience unpleasant side effects which may include dry mouth, dizziness or visual disturbances. 3. Avoid touching the patch. Wash your hands with soap and water after contact with the patch.    Post Anesthesia Home Care Instructions  Activity: Get plenty of rest for the remainder of the day. A responsible individual must stay   with you for 24 hours following the procedure.  For the next 24 hours, DO NOT: -Drive a car -Operate machinery -Drink alcoholic beverages -Take any medication unless instructed by your physician -Make any legal decisions or sign important papers.  Meals: Start with liquid foods such as gelatin or soup. Progress to regular foods as tolerated. Avoid greasy, spicy, heavy foods. If nausea and/or vomiting occur, drink only clear liquids until the nausea and/or  vomiting subsides. Call your physician if vomiting continues.  Special Instructions/Symptoms: Your throat may feel dry or sore from the anesthesia or the breathing tube placed in your throat during surgery. If this causes discomfort, gargle with warm salt water. The discomfort should disappear within 24 hours.  If you had a scopolamine patch placed behind your ear for the management of post- operative nausea and/or vomiting:  1. The medication in the patch is effective for 72 hours, after which it should be removed.  Wrap patch in a tissue and discard in the trash. Wash hands thoroughly with soap and water. 2. You may remove the patch earlier than 72 hours if you experience unpleasant side effects which may include dry mouth, dizziness or visual disturbances. 3. Avoid touching the patch. Wash your hands with soap and water after contact with the patch.    Post Anesthesia Home Care Instructions  Activity: Get plenty of rest for the remainder of the day. A responsible individual must stay with you for 24 hours following the procedure.  For the next 24 hours, DO NOT: -Drive a car -Operate machinery -Drink alcoholic beverages -Take any medication unless instructed by your physician -Make any legal decisions or sign important papers.  Meals: Start with liquid foods such as gelatin or soup. Progress to regular foods as tolerated. Avoid greasy, spicy, heavy foods. If nausea and/or vomiting occur, drink only clear liquids until the nausea and/or vomiting subsides. Call your physician if vomiting continues.  Special Instructions/Symptoms: Your throat may feel dry or sore from the anesthesia or the breathing tube placed in your throat during surgery. If this causes discomfort, gargle with warm salt water. The discomfort should disappear within 24 hours.  If you had a scopolamine patch placed behind your ear for the management of post- operative nausea and/or vomiting:  1. The medication in the  patch is effective for 72 hours, after which it should be removed.  Wrap patch in a tissue and discard in the trash. Wash hands thoroughly with soap and water. 2. You may remove the patch earlier than 72 hours if you experience unpleasant side effects which may include dry mouth, dizziness or visual disturbances. 3. Avoid touching the patch. Wash your hands with soap and water after contact with the patch.     Regional Anesthesia Blocks  1. Numbness or the inability to move the "blocked" extremity may last from 3-48 hours after placement. The length of time depends on the medication injected and your individual response to the medication. If the numbness is not going away after 48 hours, call your surgeon.  2. The extremity that is blocked will need to be protected until the numbness is gone and the  Strength has returned. Because you cannot feel it, you will need to take extra care to avoid injury. Because it may be weak, you may have difficulty moving it or using it. You may not know what position it is in without looking at it while the block is in effect.  3. For blocks in the legs and   feet, returning to weight bearing and walking needs to be done carefully. You will need to wait until the numbness is entirely gone and the strength has returned. You should be able to move your leg and foot normally before you try and bear weight or walk. You will need someone to be with you when you first try to ensure you do not fall and possibly risk injury.  4. Bruising and tenderness at the needle site are common side effects and will resolve in a few days.  5. Persistent numbness or new problems with movement should be communicated to the surgeon or the [facility] ([phone])/ [facility] ([phone]). 

## 2023-03-29 ENCOUNTER — Other Ambulatory Visit (HOSPITAL_COMMUNITY): Payer: Self-pay | Admitting: Orthopedic Surgery

## 2023-03-29 NOTE — Anesthesia Preprocedure Evaluation (Deleted)

## 2023-03-31 ENCOUNTER — Ambulatory Visit (HOSPITAL_BASED_OUTPATIENT_CLINIC_OR_DEPARTMENT_OTHER)
Admission: RE | Admit: 2023-03-31 | Payer: Commercial Managed Care - PPO | Source: Ambulatory Visit | Admitting: Orthopaedic Surgery

## 2023-03-31 SURGERY — REPAIR, TENDON, POSTERIOR TIBIAL
Anesthesia: Choice | Laterality: Left

## 2023-04-06 ENCOUNTER — Other Ambulatory Visit: Payer: Self-pay

## 2023-04-06 ENCOUNTER — Encounter (HOSPITAL_BASED_OUTPATIENT_CLINIC_OR_DEPARTMENT_OTHER)
Admission: RE | Admit: 2023-04-06 | Discharge: 2023-04-06 | Disposition: A | Discharge status: Home / Self Care | Payer: Commercial Managed Care - PPO | Source: Ambulatory Visit | Attending: Orthopedic Surgery | Admitting: Orthopedic Surgery

## 2023-04-06 DIAGNOSIS — Z01810 Encounter for preprocedural cardiovascular examination: Secondary | ICD-10-CM | POA: Insufficient documentation

## 2023-04-06 NOTE — Progress Notes (Signed)
Anesthesia consult for BMI 49.05 per Dr. Eduard Clos, will proceed with surgery as scheduled.

## 2023-04-06 NOTE — Progress Notes (Signed)

## 2023-04-08 ENCOUNTER — Ambulatory Visit (HOSPITAL_BASED_OUTPATIENT_CLINIC_OR_DEPARTMENT_OTHER): Payer: Commercial Managed Care - PPO | Admitting: Certified Registered"

## 2023-04-08 ENCOUNTER — Encounter (HOSPITAL_BASED_OUTPATIENT_CLINIC_OR_DEPARTMENT_OTHER): Payer: Self-pay | Admitting: Orthopedic Surgery

## 2023-04-08 ENCOUNTER — Other Ambulatory Visit: Payer: Self-pay

## 2023-04-08 ENCOUNTER — Ambulatory Visit (HOSPITAL_BASED_OUTPATIENT_CLINIC_OR_DEPARTMENT_OTHER): Payer: Commercial Managed Care - PPO

## 2023-04-08 ENCOUNTER — Ambulatory Visit (HOSPITAL_BASED_OUTPATIENT_CLINIC_OR_DEPARTMENT_OTHER)
Admission: RE | Admit: 2023-04-08 | Discharge: 2023-04-08 | Disposition: A | Discharge status: Home / Self Care | Payer: Commercial Managed Care - PPO | Attending: Orthopedic Surgery | Admitting: Orthopedic Surgery

## 2023-04-08 ENCOUNTER — Encounter (HOSPITAL_BASED_OUTPATIENT_CLINIC_OR_DEPARTMENT_OTHER)
Admission: RE | Disposition: A | Discharge status: Home / Self Care | Payer: Self-pay | Source: Home / Self Care | Attending: Orthopedic Surgery

## 2023-04-08 DIAGNOSIS — N289 Disorder of kidney and ureter, unspecified: Secondary | ICD-10-CM

## 2023-04-08 DIAGNOSIS — M6702 Short Achilles tendon (acquired), left ankle: Secondary | ICD-10-CM | POA: Diagnosis not present

## 2023-04-08 DIAGNOSIS — E669 Obesity, unspecified: Secondary | ICD-10-CM | POA: Insufficient documentation

## 2023-04-08 DIAGNOSIS — Z6841 Body Mass Index (BMI) 40.0 and over, adult: Secondary | ICD-10-CM | POA: Insufficient documentation

## 2023-04-08 DIAGNOSIS — M76822 Posterior tibial tendinitis, left leg: Secondary | ICD-10-CM

## 2023-04-08 DIAGNOSIS — M19072 Primary osteoarthritis, left ankle and foot: Secondary | ICD-10-CM | POA: Insufficient documentation

## 2023-04-08 DIAGNOSIS — I10 Essential (primary) hypertension: Secondary | ICD-10-CM

## 2023-04-08 DIAGNOSIS — M659 Synovitis and tenosynovitis, unspecified: Secondary | ICD-10-CM | POA: Diagnosis not present

## 2023-04-08 DIAGNOSIS — M62462 Contracture of muscle, left lower leg: Secondary | ICD-10-CM | POA: Insufficient documentation

## 2023-04-08 DIAGNOSIS — D573 Sickle-cell trait: Secondary | ICD-10-CM | POA: Diagnosis not present

## 2023-04-08 DIAGNOSIS — M722 Plantar fascial fibromatosis: Secondary | ICD-10-CM | POA: Insufficient documentation

## 2023-04-08 HISTORY — PX: GASTROC RECESSION EXTREMITY: SHX6262

## 2023-04-08 HISTORY — PX: POSTERIOR TIBIAL TENDON REPAIR: SHX6039

## 2023-04-08 HISTORY — PX: CALCANEAL OSTEOTOMY: SHX1281

## 2023-04-08 SURGERY — REPAIR, TENDON, POSTERIOR TIBIAL
Anesthesia: General | Site: Leg Lower | Laterality: Left

## 2023-04-08 MED ORDER — FENTANYL CITRATE (PF) 100 MCG/2ML IJ SOLN: 25.0000 ug | INTRAMUSCULAR | Status: DC | PRN

## 2023-04-08 MED ORDER — CELECOXIB 200 MG PO CAPS
ORAL_CAPSULE | ORAL | Status: AC
  Filled 2023-04-08: qty 1

## 2023-04-08 MED ORDER — LACTATED RINGERS IV SOLN: INTRAVENOUS | Status: DC | PRN

## 2023-04-08 MED ORDER — SODIUM CHLORIDE 0.9 % IV SOLN
INTRAVENOUS | Status: AC | PRN
  Administered 2023-04-08: 100 mL

## 2023-04-08 MED ORDER — ROPIVACAINE HCL 5 MG/ML IJ SOLN
INTRAMUSCULAR | Status: DC | PRN
  Administered 2023-04-08: 20 mL via PERINEURAL

## 2023-04-08 MED ORDER — PHENYLEPHRINE HCL-NACL 20-0.9 MG/250ML-% IV SOLN
INTRAVENOUS | Status: DC | PRN
  Administered 2023-04-08: 25 ug/min via INTRAVENOUS

## 2023-04-08 MED ORDER — PHENYLEPHRINE HCL (PRESSORS) 10 MG/ML IV SOLN
INTRAVENOUS | Status: AC
  Filled 2023-04-08: qty 1

## 2023-04-08 MED ORDER — SODIUM CHLORIDE 0.9 % IV SOLN: INTRAVENOUS | Status: DC

## 2023-04-08 MED ORDER — FENTANYL CITRATE (PF) 100 MCG/2ML IJ SOLN
INTRAMUSCULAR | Status: DC | PRN
  Administered 2023-04-08 (×4): 25 ug via INTRAVENOUS

## 2023-04-08 MED ORDER — VANCOMYCIN HCL 500 MG IV SOLR
INTRAVENOUS | Status: DC | PRN
  Administered 2023-04-08: 500 mg via TOPICAL

## 2023-04-08 MED ORDER — RIVAROXABAN 10 MG PO TABS: 10.0000 mg | ORAL_TABLET | Freq: Every day | ORAL | 0 refills | Status: DC

## 2023-04-08 MED ORDER — CEFAZOLIN IN SODIUM CHLORIDE 3-0.9 GM/100ML-% IV SOLN
INTRAVENOUS | Status: AC
  Filled 2023-04-08: qty 100

## 2023-04-08 MED ORDER — ONDANSETRON HCL 4 MG/2ML IJ SOLN
INTRAMUSCULAR | Status: DC | PRN
  Administered 2023-04-08: 4 mg via INTRAVENOUS

## 2023-04-08 MED ORDER — HYDROCODONE-ACETAMINOPHEN 5-325 MG PO TABS: 1.0000 | ORAL_TABLET | ORAL | 0 refills | Status: AC | PRN

## 2023-04-08 MED ORDER — ACETAMINOPHEN 500 MG PO TABS
ORAL_TABLET | ORAL | Status: AC
  Filled 2023-04-08: qty 2

## 2023-04-08 MED ORDER — PHENYLEPHRINE HCL (PRESSORS) 10 MG/ML IV SOLN
INTRAVENOUS | Status: DC | PRN
  Administered 2023-04-08 (×3): 80 ug via INTRAVENOUS

## 2023-04-08 MED ORDER — BUPIVACAINE-EPINEPHRINE (PF) 0.5% -1:200000 IJ SOLN
INTRAMUSCULAR | Status: DC | PRN
  Administered 2023-04-08: 30 mL

## 2023-04-08 MED ORDER — PROPOFOL 10 MG/ML IV BOLUS
INTRAVENOUS | Status: DC | PRN
  Administered 2023-04-08: 200 mg via INTRAVENOUS
  Administered 2023-04-08: 100 mg via INTRAVENOUS

## 2023-04-08 MED ORDER — 0.9 % SODIUM CHLORIDE (POUR BTL) OPTIME
TOPICAL | Status: DC | PRN
  Administered 2023-04-08: 200 mL

## 2023-04-08 MED ORDER — CELECOXIB 200 MG PO CAPS
200.0000 mg | ORAL_CAPSULE | Freq: Once | ORAL | Status: AC
  Administered 2023-04-08: 200 mg via ORAL

## 2023-04-08 MED ORDER — PHENYLEPHRINE 80 MCG/ML (10ML) SYRINGE FOR IV PUSH (FOR BLOOD PRESSURE SUPPORT)
PREFILLED_SYRINGE | INTRAVENOUS | Status: AC
  Filled 2023-04-08: qty 20

## 2023-04-08 MED ORDER — FENTANYL CITRATE (PF) 100 MCG/2ML IJ SOLN
INTRAMUSCULAR | Status: AC
  Filled 2023-04-08: qty 2

## 2023-04-08 MED ORDER — ATROPINE SULFATE 0.4 MG/ML IV SOLN
INTRAVENOUS | Status: AC
  Filled 2023-04-08: qty 1

## 2023-04-08 MED ORDER — MIDAZOLAM HCL 2 MG/2ML IJ SOLN
2.0000 mg | Freq: Once | INTRAMUSCULAR | Status: AC
  Administered 2023-04-08: 1 mg via INTRAVENOUS

## 2023-04-08 MED ORDER — CEFAZOLIN IN SODIUM CHLORIDE 3-0.9 GM/100ML-% IV SOLN
3.0000 g | INTRAVENOUS | Status: AC
  Administered 2023-04-08: 3 g via INTRAVENOUS

## 2023-04-08 MED ORDER — AMISULPRIDE (ANTIEMETIC) 5 MG/2ML IV SOLN: 10.0000 mg | Freq: Once | INTRAVENOUS | Status: DC | PRN

## 2023-04-08 MED ORDER — LIDOCAINE HCL (CARDIAC) PF 100 MG/5ML IV SOSY
PREFILLED_SYRINGE | INTRAVENOUS | Status: DC | PRN
  Administered 2023-04-08: 20 mg via INTRAVENOUS

## 2023-04-08 MED ORDER — CLONIDINE HCL (ANALGESIA) 100 MCG/ML EP SOLN
EPIDURAL | Status: DC | PRN
  Administered 2023-04-08 (×2): 50 ug

## 2023-04-08 MED ORDER — MIDAZOLAM HCL 2 MG/2ML IJ SOLN
INTRAMUSCULAR | Status: AC
  Filled 2023-04-08: qty 2

## 2023-04-08 MED ORDER — FENTANYL CITRATE (PF) 100 MCG/2ML IJ SOLN
100.0000 ug | Freq: Once | INTRAMUSCULAR | Status: AC
  Administered 2023-04-08: 50 ug via INTRAVENOUS

## 2023-04-08 MED ORDER — ACETAMINOPHEN 500 MG PO TABS
1000.0000 mg | ORAL_TABLET | Freq: Once | ORAL | Status: AC
  Administered 2023-04-08: 1000 mg via ORAL

## 2023-04-08 MED ORDER — CEFAZOLIN SODIUM-DEXTROSE 2-4 GM/100ML-% IV SOLN: 2.0000 g | INTRAVENOUS | Status: DC

## 2023-04-08 MED ORDER — DEXAMETHASONE SODIUM PHOSPHATE 10 MG/ML IJ SOLN
INTRAMUSCULAR | Status: DC | PRN
  Administered 2023-04-08: 5 mg via INTRAVENOUS

## 2023-04-08 SURGICAL SUPPLY — 102 items
APL PRP STRL LF DISP 70% ISPRP (MISCELLANEOUS) ×4
BANDAGE ESMARK 6X9 LF (GAUZE/BANDAGES/DRESSINGS) IMPLANT
BLADE AVERAGE 25X9 (BLADE) IMPLANT
BLADE MICRO SAGITTAL (BLADE) ×3 IMPLANT
BLADE MINI RND TIP GREEN BEAV (BLADE) ×1 IMPLANT
BLADE SURG 15 STRL LF DISP TIS (BLADE) ×12 IMPLANT
BLADE SURG 15 STRL SS (BLADE) ×12
BNDG CMPR 5X62 HK CLSR LF (GAUZE/BANDAGES/DRESSINGS) ×4
BNDG CMPR 6"X 5 YARDS HK CLSR (GAUZE/BANDAGES/DRESSINGS) ×4
BNDG CMPR 9X4 STRL LF SNTH (GAUZE/BANDAGES/DRESSINGS)
BNDG CMPR 9X6 STRL LF SNTH (GAUZE/BANDAGES/DRESSINGS)
BNDG ELASTIC 4X5.8 VLCR STR LF (GAUZE/BANDAGES/DRESSINGS) ×4 IMPLANT
BNDG ELASTIC 6INX 5YD STR LF (GAUZE/BANDAGES/DRESSINGS) ×4 IMPLANT
BNDG ESMARK 4X9 LF (GAUZE/BANDAGES/DRESSINGS) IMPLANT
BNDG ESMARK 6X9 LF (GAUZE/BANDAGES/DRESSINGS)
BOOT STEPPER DURA LG (SOFTGOODS) IMPLANT
BOOT STEPPER DURA MED (SOFTGOODS) IMPLANT
BOOT STEPPER DURA XLG (SOFTGOODS) IMPLANT
BUR MIS STRT 3.1X20 (BURR) ×1 IMPLANT
BURR MIS STRT 3.1X20 (BURR) ×4
CANISTER SUCT 1200ML W/VALVE (MISCELLANEOUS) ×4 IMPLANT
CHLORAPREP W/TINT 26 (MISCELLANEOUS) ×4 IMPLANT
COVER BACK TABLE 60X90IN (DRAPES) ×4 IMPLANT
CUFF TOURN SGL QUICK 34 (TOURNIQUET CUFF)
CUFF TOURN SGL QUICK 42 (TOURNIQUET CUFF) ×1 IMPLANT
CUFF TRNQT CYL 34X4.125X (TOURNIQUET CUFF) IMPLANT
DRAPE EXTREMITY T 121X128X90 (DISPOSABLE) ×4 IMPLANT
DRAPE INCISE IOBAN 66X45 STRL (DRAPES) ×1 IMPLANT
DRAPE OEC MINIVIEW 54X84 (DRAPES) ×4 IMPLANT
DRAPE U-SHAPE 47X51 STRL (DRAPES) ×4 IMPLANT
DRSG MEPITEL 4X7.2 (GAUZE/BANDAGES/DRESSINGS) ×4 IMPLANT
ELECT REM PT RETURN 9FT ADLT (ELECTROSURGICAL) ×4
ELECTRODE REM PT RTRN 9FT ADLT (ELECTROSURGICAL) ×4 IMPLANT
GAUZE PAD ABD 8X10 STRL (GAUZE/BANDAGES/DRESSINGS) ×8 IMPLANT
GAUZE SPONGE 4X4 12PLY STRL (GAUZE/BANDAGES/DRESSINGS) ×4 IMPLANT
GLOVE BIO SURGEON STRL SZ8 (GLOVE) ×5 IMPLANT
GLOVE BIOGEL PI IND STRL 7.0 (GLOVE) ×3 IMPLANT
GLOVE BIOGEL PI IND STRL 8 (GLOVE) ×8 IMPLANT
GLOVE SURG SS PI 7.0 STRL IVOR (GLOVE) ×2 IMPLANT
GLOVE SURG SS PI 7.5 STRL IVOR (GLOVE) ×1 IMPLANT
GOWN STRL REUS W/ TWL LRG LVL3 (GOWN DISPOSABLE) ×5 IMPLANT
GOWN STRL REUS W/ TWL XL LVL3 (GOWN DISPOSABLE) ×7 IMPLANT
GOWN STRL REUS W/TWL LRG LVL3 (GOWN DISPOSABLE) ×8
GOWN STRL REUS W/TWL XL LVL3 (GOWN DISPOSABLE) ×4
GUIDEWIRE W/TRCR TIP 2.4X9.25 (WIRE) ×2 IMPLANT
K-WIRE DBL .062X4 NSTRL (WIRE)
KIT ACCESSORY DRILL 5 (KITS) ×1 IMPLANT
KWIRE DBL .062X4 NSTRL (WIRE) IMPLANT
MAT PREVALON FULL STRYKER (MISCELLANEOUS) ×1 IMPLANT
NDL HYPO 22X1.5 SAFETY MO (MISCELLANEOUS) IMPLANT
NDL HYPO 25X1 1.5 SAFETY (NEEDLE) IMPLANT
NDL SUT 6 .5 CRC .975X.05 MAYO (NEEDLE) ×1 IMPLANT
NEEDLE HYPO 22X1.5 SAFETY MO (MISCELLANEOUS) IMPLANT
NEEDLE HYPO 25X1 1.5 SAFETY (NEEDLE) IMPLANT
NEEDLE MAYO TAPER (NEEDLE) ×4
NS IRRIG 1000ML POUR BTL (IV SOLUTION) ×4 IMPLANT
PACK BASIN DAY SURGERY FS (CUSTOM PROCEDURE TRAY) ×4 IMPLANT
PAD CAST 4YDX4 CTTN HI CHSV (CAST SUPPLIES) ×4 IMPLANT
PADDING CAST ABS COTTON 4X4 ST (CAST SUPPLIES) IMPLANT
PADDING CAST COTTON 4X4 STRL (CAST SUPPLIES) ×4
PADDING CAST COTTON 6X4 STRL (CAST SUPPLIES) ×4 IMPLANT
PASSER SUT SWANSON 36MM LOOP (INSTRUMENTS) IMPLANT
PENCIL SMOKE EVACUATOR (MISCELLANEOUS) ×4 IMPLANT
SANITIZER HAND PURELL FF 515ML (MISCELLANEOUS) ×4 IMPLANT
SCOTCHCAST PLUS 4X4 WHITE (CAST SUPPLIES) IMPLANT
SCREW COMPR FT 7X45 (Screw) ×1 IMPLANT
SCREW COMPR FT 7X50 (Screw) ×1 IMPLANT
SCREW PEEK TENODESIS 6X12MM (Screw) ×1 IMPLANT
SET IRRIGATION TUBING (TUBING) ×1 IMPLANT
SHEET MEDIUM DRAPE 40X70 STRL (DRAPES) ×4 IMPLANT
SLEEVE SCD COMPRESS KNEE MED (STOCKING) ×4 IMPLANT
SPIKE FLUID TRANSFER (MISCELLANEOUS) IMPLANT
SPLINT PLASTER CAST FAST 5X30 (CAST SUPPLIES) ×80 IMPLANT
SPONGE T-LAP 18X18 ~~LOC~~+RFID (SPONGE) ×4 IMPLANT
STOCKINETTE 6  STRL (DRAPES) ×4
STOCKINETTE 6 STRL (DRAPES) ×4 IMPLANT
SUCTION FRAZIER HANDLE 10FR (MISCELLANEOUS) ×8
SUCTION TUBE FRAZIER 10FR DISP (MISCELLANEOUS) ×5 IMPLANT
SUT BONE WAX W31G (SUTURE) IMPLANT
SUT ETHIBOND 0 MO6 C/R (SUTURE) IMPLANT
SUT ETHIBOND 2 OS 4 DA (SUTURE) IMPLANT
SUT ETHILON 3 0 PS 1 (SUTURE) ×5 IMPLANT
SUT FIBERWIRE #2 38 T-5 BLUE (SUTURE)
SUT FIBERWIRE 2-0 18 17.9 3/8 (SUTURE)
SUT MERSILENE 2.0 SH NDLE (SUTURE) IMPLANT
SUT MNCRL AB 3-0 PS2 18 (SUTURE) ×4 IMPLANT
SUT MNCRL AB 4-0 PS2 18 (SUTURE) IMPLANT
SUT VIC AB 2-0 SH 18 (SUTURE) IMPLANT
SUT VIC AB 2-0 SH 27 (SUTURE) ×4
SUT VIC AB 2-0 SH 27XBRD (SUTURE) ×1 IMPLANT
SUT VICRYL 0 SH 27 (SUTURE) ×5 IMPLANT
SUT VICRYL 0 UR6 27IN ABS (SUTURE) IMPLANT
SUTURE FIBERWR #2 38 T-5 BLUE (SUTURE) IMPLANT
SUTURE FIBERWR 2-0 18 17.9 3/8 (SUTURE) IMPLANT
SUTURE TAPE 1.3 FIBERLOP 20 ST (SUTURE) IMPLANT
SUTURETAPE 1.3 FIBERLOOP 20 ST (SUTURE)
SYR BULB EAR ULCER 3OZ GRN STR (SYRINGE) ×4 IMPLANT
SYR CONTROL 10ML LL (SYRINGE) IMPLANT
TOWEL GREEN STERILE FF (TOWEL DISPOSABLE) ×8 IMPLANT
TUBE CONNECTING 20X1/4 (TUBING) ×4 IMPLANT
UNDERPAD 30X36 HEAVY ABSORB (UNDERPADS AND DIAPERS) ×4 IMPLANT
YANKAUER SUCT BULB TIP NO VENT (SUCTIONS) IMPLANT

## 2023-04-08 NOTE — Transfer of Care (Signed)
Immediate Anesthesia Transfer of Care Note  Patient: [name]  Procedure(s) Performed: Left posterior tibialis debridement , tendon transfer (Left: Ankle) Calcaneal Osteotomy (Left: Foot) Possible Gastrocnemius Slide (Left: Leg Lower)  Patient Location: PACU  Anesthesia Type:GA combined with regional for post-op pain  Level of Consciousness: drowsy  Airway & Oxygen Therapy: Patient Spontanous Breathing and Patient connected to face mask oxygen  Post-op Assessment: Report given to RN and Post -op Vital signs reviewed and stable  Post vital signs: Reviewed and stable  Last Vitals:  Vitals Value Taken Time  BP 153/96 04/08/23 1531  Temp    Pulse 68 04/08/23 1532  Resp 18 04/08/23 1532  SpO2 98 % 04/08/23 1532  Vitals shown include unvalidated device data.  Last Pain:  Vitals:   04/08/23 1136  TempSrc: Oral  PainSc: 0-No pain         Complications: No notable events documented.

## 2023-04-08 NOTE — Op Note (Signed)
04/08/2023  3:25 PM  PATIENT:  [name]  62 y.o. female  PRE-OPERATIVE DIAGNOSIS: 1.  Left posterior tibial tendon dysfunction 2.  Short left Achilles tendon  POST-OPERATIVE DIAGNOSIS: Same  Procedure(s): 1.  Left gastrocnemius recession 2.  Left medializing calcaneus osteotomy 3.  Left posterior tibial tendon tenolysis 4.  Deep transfer of the left flexor digitorum longus tendon to the navicular 5.  Left foot AP, lateral and Harris heel radiographs  SURGEON:  Toni Arthurs, MD  ASSISTANT: Netta Cedars, MD  ANESTHESIA:   General, regional  EBL:  minimal   TOURNIQUET:   Total Tourniquet Time Documented: Thigh (Left) - 54 minutes Total: Thigh (Left) - 54 minutes  COMPLICATIONS:  None apparent  DISPOSITION:  Extubated, awake and stable to recovery.  INDICATION FOR PROCEDURE: 62 year old female with a past medical history significant for obesity complains of worsening left medial ankle and hindfoot pain for more than a year.  She has failed nonoperative treatment including activity modification, oral anti-inflammatories, bracing and physical therapy.  MRI reveals significant degenerative changes to the posterior tibial tendon as well as lateral hindfoot impingement changes at the calcaneus.  She presents now for surgical treatment of these painful left lower extremity conditions.  The risks and benefits of the alternative treatment options have been discussed in detail.  The patient wishes to proceed with surgery and specifically understands risks of bleeding, infection, nerve damage, blood clots, need for additional surgery, amputation and death.   PROCEDURE IN DETAIL:  After pre operative consent was obtained, and the correct operative site was identified, the patient was brought to the operating room and placed supine on the OR table.  Anesthesia was administered.  Pre-operative antibiotics were administered.  A surgical timeout was taken.  The left lower extremity was  prepped and draped in standard sterile fashion with a tourniquet around the thigh.  The extremity was elevated, and the tourniquet was inflated to 250 mmHg.  A longitudinal incision was made at the medial calf.  Dissection was carried sharply down through the subcutaneous tissues.  The medial fascia was incised.  The gastrocnemius tendon was identified.  The plantaris tendon was divided under direct vision.  The gastrocnemius tendon was divided from medial to lateral taking care to protect the sural nerve posteriorly.  The ankle was then dorsiflexed 20 degrees with the knee extended.  The wound was irrigated copiously and sprinkled with vancomycin powder.  Subcutaneous tissues were approximated with Monocryl.  Skin incision was closed with nylon.  Attention was turned to the lateral hindfoot.  The midpoint of the isthmus was identified and a small incision made.  A 3 mm x 20 mm Shannon bur was used to create the osteotomy using the four-quadrant technique.  The tuberosity was translated medially with a Engineering geologist.  Guidepins were placed from the tuberosity across to the subchondral bone of the posterior facet.  Lateral and Harris heel radiographs confirmed appropriate position of both guidepins.  A small incision was made.  The pin was measured.  A 7 mm headless Arthrex compression screw was inserted.  A second screw was placed over the more medial pin.  Radiographs confirmed appropriate position of both screws.  The guide pins were removed.  The wounds were irrigated and closed with nylon.  Attention was turned to the medial ankle and hindfoot.  An incision was made over the posterior tibial tendon sheath.  Dissection was carried sharply down through the subcutaneous tissues.  The posterior tibial tendon was  noted to have a longitudinal split tear.  The tear was debrided along with all tenosynovitis.  The spring ligament was noted to be intact.  The FDL tendon was identified at the floor of the tendon  sheath.  It was dissected free distally to the knot of Sherilyn Cooter.  It was transected and whipstitched with 0 Vicryl.  A drill hole was made under fluoroscopic guidance in the navicular tuberosity.  The tendon was pulled from plantar to dorsal and fixed with a 6 mm [place] medical bolt.  Whipstitch sutures were then further sutured to the adjacent periosteum with free needles.  Final AP, lateral and Harris heel radiographs confirmed appropriate coverage of the navicular on the talus and appropriate position and length of all hardware.  The medial wound was irrigated copiously and sprinkled with vancomycin powder.  The deep fascia was approximated with 0 Vicryl simple sutures.  Skin incision was closed with a running 3-0 nylon.  Sterile dressings were applied followed by a well-padded short leg splint.  The tourniquet was released after application of the dressings.  The patient was awakened from anesthesia and transported to the recovery room in stable condition.   FOLLOW UP PLAN: Nonweightbearing on the left lower extremity.  Xarelto for DVT prophylaxis.  Follow-up in the office in 2 weeks for suture removal and conversion to a short leg cast.   RADIOGRAPHS: Lateral, AP and Harris heel radiographs of the left foot are obtained intraoperatively.  These show medialization of the calcaneus tuberosity and appropriate coverage of the head of the talus by the navicular.  Hardware is appropriately positioned and of the appropriate lengths.  No other acute injuries are noted.   Netta Cedars, MD was present and scrubbed for the duration of the operative case. His assistance was essential in positioning the patient, prepping and draping, gaining and maintaining exposure, performing the operation, closing and dressing the wounds and applying the splint.

## 2023-04-08 NOTE — Interval H&P Note (Signed)
History and Physical Interval Note:  04/08/2023 1:35 PM  [name]  has presented today for surgery, with the diagnosis of Left posterior tibialis tendinitis, gastrocnemius contracture, insertional Achilles tendinitis, plantar fasciitis.  The various methods of treatment have been discussed with the patient and family. After consideration of risks, benefits and other options for treatment, the patient has consented to  Procedure(s) with comments: Left posterior tibialis debridement with possible repair, possible tendon transfer, possible plantar fasciectomy (Left) - Calcaneal Osteotomy (Left) Possible Gastrocnemius Slide (Left) as a surgical intervention.  The patient's history has been reviewed, patient examined, no change in status, stable for surgery.  I have reviewed the patient's chart and labs.  Questions were answered to the patient's satisfaction.    The risks and benefits of the alternative treatment options have been discussed in detail.  The patient wishes to proceed with surgery and specifically understands risks of bleeding, infection, nerve damage, blood clots, need for additional surgery, amputation and death.    Toni Arthurs

## 2023-04-08 NOTE — Anesthesia Procedure Notes (Signed)
Anesthesia Regional Block: Adductor canal block   Pre-Anesthetic Checklist: , timeout performed,  Correct Patient, Correct Site, Correct Laterality,  Correct Procedure, Correct Position, site marked,  Risks and benefits discussed,  Surgical consent,  Pre-op evaluation,  At surgeon's request and post-op pain management  Laterality: Left  Prep: chloraprep       Needles:  Injection technique: Single-shot  Needle Type: Echogenic Needle     Needle Length: 9cm  Needle Gauge: 21     Additional Needles:   Procedures:,,,, ultrasound used (permanent image in chart),,    Narrative:  Start time: 04/08/2023 12:59 PM End time: 04/08/2023 1:05 PM Injection made incrementally with aspirations every 5 mL.  Performed by: Personally  Anesthesiologist: Marcene Duos, MD

## 2023-04-08 NOTE — Anesthesia Preprocedure Evaluation (Addendum)
Anesthesia Evaluation  Patient identified by MRN, date of birth, ID band Patient awake    Reviewed: Allergy & Precautions, NPO status , Patient's Chart, lab work & pertinent test results  Airway Mallampati: III  TM Distance: >3 FB Neck ROM: Full    Dental  (+) Dental Advisory Given   Pulmonary neg pulmonary ROS   breath sounds clear to auscultation       Cardiovascular hypertension, Pt. on medications  Rhythm:Regular Rate:Normal     Neuro/Psych negative neurological ROS     GI/Hepatic negative GI ROS, Neg liver ROS,,,  Endo/Other    Morbid obesity  Renal/GU Renal disease     Musculoskeletal  (+) Arthritis ,    Abdominal   Peds  Hematology  (+) Blood dyscrasia, Sickle cell trait   Anesthesia Other Findings   Reproductive/Obstetrics                             Anesthesia Physical Anesthesia Plan  ASA: 3  Anesthesia Plan: General   Post-op Pain Management: Regional block*, Tylenol PO (pre-op)* and Celebrex PO (pre-op)*   Induction: Intravenous  PONV Risk Score and Plan: 3 and Dexamethasone, Ondansetron, Midazolam and Treatment may vary due to age or medical condition  Airway Management Planned: LMA  Additional Equipment: None  Intra-op Plan:   Post-operative Plan: Extubation in OR  Informed Consent: I have reviewed the patients History and Physical, chart, labs and discussed the procedure including the risks, benefits and alternatives for the proposed anesthesia with the patient or authorized representative who has indicated his/her understanding and acceptance.     Dental advisory given  Plan Discussed with: CRNA  Anesthesia Plan Comments:        Anesthesia Quick Evaluation

## 2023-04-08 NOTE — Discharge Instructions (Addendum)
Toni Arthurs, MD [facility]  Please read the following information regarding your care after surgery.  Medications  You only need a prescription for the narcotic pain medicine (ex. oxycodone, Percocet, Norco).  All of the other medicines listed below are available over the counter. X Aleve 2 pills twice a day for the first 3 days after surgery. X hydrocodone / acetaminophen as prescribed for severe pain  Narcotic pain medicine (ex. oxycodone, Percocet, Vicodin) will cause constipation.  To prevent this problem, take the following medicines while you are taking any pain medicine. X docusate sodium (Colace) 100 mg twice a day X senna (Senokot) 2 tablets twice a day  X To help prevent blood clots, take Xarelto daily for two weeks after surgery.  Then take a baby aspirin (81 mg) twice a day for the following month.  You should also get up every hour while you are awake to move around.    Weight Bearing X Do not bear any weight on the operated leg or foot.  Cast / Splint / Dressing X Keep your splint, cast or dressing clean and dry.  Don't put anything (coat hanger, pencil, etc) down inside of it.  If it gets damp, use a hair dryer on the cool setting to dry it.  If it gets soaked, call the office to schedule an appointment for a cast change.  After your dressing, cast or splint is removed; you may shower, but do not soak or scrub the wound.  Allow the water to run over it, and then gently pat it dry.  Swelling It is normal for you to have swelling where you had surgery.  To reduce swelling and pain, keep your toes above your nose for at least 3 days after surgery.  It may be necessary to keep your foot or leg elevated for several weeks.  If it hurts, it should be elevated.  Follow Up Call my office at [phone] when you are discharged from the hospital or surgery center to schedule an appointment to be seen two weeks after surgery.  Call my office at [phone] if you develop a fever  >101.5 F, nausea, vomiting, bleeding from the surgical site or severe pain.      No Tylenol before 7:00pm if needed. No ibuprofen before 9:00pm if needed.   Post Anesthesia Home Care Instructions  Activity: Get plenty of rest for the remainder of the day. A responsible individual must stay with you for 24 hours following the procedure.  For the next 24 hours, DO NOT: -Drive a car -Advertising copywriter -Drink alcoholic beverages -Take any medication unless instructed by your physician -Make any legal decisions or sign important papers.  Meals: Start with liquid foods such as gelatin or soup. Progress to regular foods as tolerated. Avoid greasy, spicy, heavy foods. If nausea and/or vomiting occur, drink only clear liquids until the nausea and/or vomiting subsides. Call your physician if vomiting continues.  Special Instructions/Symptoms: Your throat may feel dry or sore from the anesthesia or the breathing tube placed in your throat during surgery. If this causes discomfort, gargle with warm salt water. The discomfort should disappear within 24 hours.   Regional Anesthesia Blocks  1. Numbness or the inability to move the "blocked" extremity may last from 3-48 hours after placement. The length of time depends on the medication injected and your individual response to the medication. If the numbness is not going away after 48 hours, call your surgeon.  2. The extremity that is blocked will  need to be protected until the numbness is gone and the  Strength has returned. Because you cannot feel it, you will need to take extra care to avoid injury. Because it may be weak, you may have difficulty moving it or using it. You may not know what position it is in without looking at it while the block is in effect.  3. For blocks in the legs and feet, returning to weight bearing and walking needs to be done carefully. You will need to wait until the numbness is entirely gone and the strength has  returned. You should be able to move your leg and foot normally before you try and bear weight or walk. You will need someone to be with you when you first try to ensure you do not fall and possibly risk injury.  4. Bruising and tenderness at the needle site are common side effects and will resolve in a few days.  5. Persistent numbness or new problems with movement should be communicated to the surgeon or the [facility] [phone] [facility] [phone]).

## 2023-04-08 NOTE — Anesthesia Postprocedure Evaluation (Signed)
Anesthesia Post Note  Patient: [name]  Procedure(s) Performed: Left posterior tibialis debridement/tenolysis , left flexor digitorum longus tendon to navicular transfer (Left: Ankle) Calcaneal Osteotomy (Left: Foot) Gastroc recession (Left: Leg Lower)     Patient location during evaluation: PACU Anesthesia Type: General Level of consciousness: awake and alert Pain management: pain level controlled Vital Signs Assessment: post-procedure vital signs reviewed and stable Respiratory status: spontaneous breathing, nonlabored ventilation, respiratory function stable and patient connected to nasal cannula oxygen Cardiovascular status: blood pressure returned to baseline and stable Postop Assessment: no apparent nausea or vomiting Anesthetic complications: no  No notable events documented.  Last Vitals:  Vitals:   04/08/23 1600 04/08/23 1630  BP: (!) 158/97 (!) 148/82  Pulse: (!) 53 62  Resp: 15 14  Temp:  36.5 C  SpO2: 94% 94%    Last Pain:  Vitals:   04/08/23 1630  TempSrc:   PainSc: 0-No pain                 Kennieth Rad

## 2023-04-08 NOTE — Progress Notes (Signed)
Assisted Dr. Rob Fitzgerald with left, adductor canal, popliteal, ultrasound guided block. Side rails up, monitors on throughout procedure. See vital signs in flow sheet. Tolerated Procedure well. 

## 2023-04-08 NOTE — Anesthesia Procedure Notes (Signed)
Procedure Name: LMA Insertion Date/Time: 04/08/2023 1:57 PM  Performed by: Lauralyn Primes, CRNAPre-anesthesia Checklist: Patient identified, Emergency Drugs available, Suction available and Patient being monitored Patient Re-evaluated:Patient Re-evaluated prior to induction Oxygen Delivery Method: Circle system utilized Preoxygenation: Pre-oxygenation with 100% oxygen Induction Type: IV induction Ventilation: Mask ventilation without difficulty LMA: LMA inserted LMA Size: 4.0 Number of attempts: 1 Airway Equipment and Method: Bite block Placement Confirmation: positive ETCO2 Tube secured with: Tape Dental Injury: Teeth and Oropharynx as per pre-operative assessment

## 2023-04-08 NOTE — Anesthesia Procedure Notes (Signed)
Anesthesia Regional Block: Popliteal block   Pre-Anesthetic Checklist: , timeout performed,  Correct Patient, Correct Site, Correct Laterality,  Correct Procedure, Correct Position, site marked,  Risks and benefits discussed,  Surgical consent,  Pre-op evaluation,  At surgeon's request and post-op pain management  Laterality: Left  Prep: chloraprep       Needles:  Injection technique: Single-shot  Needle Type: Echogenic Needle     Needle Length: 9cm  Needle Gauge: 21     Additional Needles:   Procedures:,,,, ultrasound used (permanent image in chart),,    Narrative:  Start time: 04/08/2023 12:53 PM End time: 04/08/2023 12:59 PM Injection made incrementally with aspirations every 5 mL.  Performed by: Personally  Anesthesiologist: Marcene Duos, MD

## 2023-04-13 ENCOUNTER — Encounter (HOSPITAL_BASED_OUTPATIENT_CLINIC_OR_DEPARTMENT_OTHER): Payer: Self-pay | Admitting: Orthopedic Surgery

## 2023-05-20 ENCOUNTER — Encounter: Payer: Self-pay | Admitting: *Deleted

## 2023-11-27 ENCOUNTER — Ambulatory Visit
Admission: EM | Admit: 2023-11-27 | Discharge: 2023-11-27 | Disposition: A | Discharge status: Home / Self Care | Payer: Commercial Managed Care - PPO | Attending: Family Medicine | Admitting: Family Medicine

## 2023-11-27 DIAGNOSIS — M1710 Unilateral primary osteoarthritis, unspecified knee: Secondary | ICD-10-CM | POA: Insufficient documentation

## 2023-11-27 DIAGNOSIS — N390 Urinary tract infection, site not specified: Secondary | ICD-10-CM | POA: Diagnosis present

## 2023-11-27 LAB — POCT URINALYSIS DIP (MANUAL ENTRY)
Bilirubin, UA: NEGATIVE
Glucose, UA: NEGATIVE mg/dL
Ketones, POC UA: NEGATIVE mg/dL
Nitrite, UA: NEGATIVE
Protein Ur, POC: 30 mg/dL — AB
Spec Grav, UA: 1.02 (ref 1.010–1.025)
Urobilinogen, UA: 0.2 U/dL
pH, UA: 6 (ref 5.0–8.0)

## 2023-11-27 MED ORDER — CEPHALEXIN 500 MG PO CAPS: 500.0000 mg | ORAL_CAPSULE | Freq: Two times a day (BID) | ORAL | 0 refills | Status: DC

## 2023-11-27 MED ORDER — DEXAMETHASONE SODIUM PHOSPHATE 10 MG/ML IJ SOLN
10.0000 mg | Freq: Once | INTRAMUSCULAR | Status: AC
  Administered 2023-11-27: 10 mg via INTRAMUSCULAR

## 2023-11-27 NOTE — ED Provider Notes (Signed)
[facility]    CSN: [id number] Arrival date & time: 11/27/23  1200      History   Chief Complaint No chief complaint on file.   HPI [name] is a 62 y.o. female.   Presenting today with 2-day history of low back aching, urinary urgency.  Denies fever, chills, chest pain, shortness of breath, nausea, vomiting, bowel changes.  She is also having acute on chronic right knee pain from her arthritis flaring up.  She previously was getting cortisone shots to the knee but stopped getting them because her pain was under better control.  Trying over-the-counter pain relievers with minimal relief.  No new injury, redness, numbness, tingling, swelling.    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   Trichomoniasis 08/29/2014   Status post total hysterectomy and bilateral salpingo-oophorectomy 07/13/2014   S/P hysterectomy with oophorectomy 07/10/2014   H N P-LUMBAR 02/24/2010   Backache 02/24/2010   History of cardiovascular disorder 02/20/2010    Past Surgical History:  Procedure Laterality Date   ABDOMINAL HYSTERECTOMY N/A 07/10/2014   Procedure: HYSTERECTOMY ABDOMINAL;  Surgeon: Tilda Burrow, MD;  Location: [facility];  Service: Gynecology;  Laterality: N/A;   BACK SURGERY     CALCANEAL OSTEOTOMY Left 04/08/2023   Procedure: Calcaneal Osteotomy;  Surgeon: Toni Arthurs, MD;  Location: [facility];  Service: Orthopedics;  Laterality: Left;   CATARACT EXTRACTION W/PHACO Left 10/10/2018   Procedure: CATARACT EXTRACTION PHACO AND INTRAOCULAR LENS PLACEMENT (IOC);  Surgeon: Gemma Payor, MD;  Location: [facility];  Service: Ophthalmology;  Laterality: Left;  CDE: 2.63   CATARACT EXTRACTION W/PHACO Right 10/27/2018   Procedure: CATARACT EXTRACTION PHACO AND  INTRAOCULAR LENS PLACEMENT RIGHT EYE ;  Surgeon: Gemma Payor, MD;  Location: [facility];  Service: Ophthalmology;  Laterality: Right;  right   COLONOSCOPY N/A 07/21/2013   Procedure: COLONOSCOPY;  Surgeon: West Bali, MD;  Location: [facility];  Service: Endoscopy;  Laterality: N/A;  10:15 AM   CYST EXCISION Right    knee   CYSTOSCOPY W/ URETERAL STENT PLACEMENT Right 02/18/2022   Procedure: CYSTOSCOPY WITH RETROGRADE PYELOGRAM/URETERAL STENT PLACEMENT;  Surgeon: Sebastian Ache, MD;  Location: [facility];  Service: Urology;  Laterality: Right;   GASTROC RECESSION EXTREMITY Left 04/08/2023   Procedure: Gastroc recession;  Surgeon: Toni Arthurs, MD;  Location: [facility];  Service: Orthopedics;  Laterality: Left;   HOLMIUM LASER APPLICATION Right 02/20/2022   Procedure: HOLMIUM LASER APPLICATION;  Surgeon: Sebastian Ache, MD;  Location: [facility];  Service: Urology;  Laterality: Right;   NEPHROLITHOTOMY Right 02/18/2022   Procedure: FIRST STAGE NEPHROLITHOTOMY PERCUTANEOUS WITH SURGEON ACCESS;  Surgeon: Sebastian Ache, MD;  Location: [facility];  Service: Urology;  Laterality: Right;  3 HRS   NEPHROLITHOTOMY Right 02/20/2022   Procedure: SECOND STAGENEPHROLITHOTOMY PERCUTANEOUS, NEPHROSTOMY TUBE CHANGE;  Surgeon: Sebastian Ache, MD;  Location: [facility];  Service: Urology;  Laterality: Right;  3 HRS   NEPHROLITHOTOMY  02/23/2022   Procedure: NEPHROLITHOTOMY PERCUTANEOUS THIRD STAGE AND STENT PLACEMENT;  Surgeon: Sebastian Ache, MD;  Location: [facility];  Service: Urology;;   POSTERIOR TIBIAL TENDON REPAIR Left 04/08/2023   Procedure: Left posterior tibialis debridement/tenolysis , left flexor digitorum longus tendon  to navicular transfer;  Surgeon: Toni Arthurs, MD;  Location: [facility];  Service: Orthopedics;  Laterality: Left;   SALPINGOOPHORECTOMY Bilateral 07/10/2014   Procedure: SALPINGO OOPHORECTOMY;  Surgeon: Tilda Burrow, MD;  Location: [facility];  Service: Gynecology;   Laterality: Bilateral;   TUBAL LIGATION     WRIST SURGERY      OB History     Gravida  3   Para  2   Term  2   Preterm      AB  1   Living         SAB      IAB      Ectopic  1   Multiple      Live Births               Home Medications    Prior to Admission medications   Medication Sig Start Date End Date Taking? Authorizing Provider  cephALEXin (KEFLEX) 500 MG capsule Take 1 capsule (500 mg total) by mouth 2 (two) times daily. 11/27/23  Yes Particia Nearing, PA-C  cholecalciferol (VITAMIN D) 1000 UNITS tablet Take 1,000 Units by mouth daily.      [provider]  diphenhydramine-acetaminophen (TYLENOL PM) 25-500 MG TABS tablet Take 1 tablet by mouth at bedtime as needed (sleep).    [provider]  lisinopril-hydrochlorothiazide (PRINZIDE,ZESTORETIC) 20-12.5 MG per tablet Take 1 tablet by mouth daily.      [provider]  olmesartan (BENICAR) 5 MG tablet Take by mouth daily.    [provider]  Omega-3 Fatty Acids (FISH OIL) 1200 MG CAPS Take 1,200 mg by mouth daily.    [provider]  oxymetazoline (AFRIN) 0.05 % nasal spray Place 1 spray into both nostrils 2 (two) times daily as needed for congestion.    [provider]  rivaroxaban (XARELTO) 10 MG TABS tablet Take 1 tablet (10 mg total) by mouth daily for 14 days. 04/09/23 04/23/23  Toni Arthurs, MD    Family History Family History  Problem Relation Age of Onset   Hypertension Mother    Sickle cell trait Mother    Heart attack Father    Sickle cell anemia Sister    Sickle cell trait Sister     Social History Social History   Tobacco Use   Smoking status: Never   Smokeless tobacco: Never  Vaping Use   Vaping status: Never Used  Substance Use Topics   Alcohol use: No   Drug use: No     Allergies   Oxycodone   Review of Systems Review of Systems PER HPI  Physical Exam Triage Vital Signs ED Triage Vitals [11/27/23 1235]   Encounter Vitals Group     BP 120/80     Systolic BP Percentile      Diastolic BP Percentile      Pulse Rate 92     Resp 20     Temp 98 F (36.7 C)     Temp Source Oral     SpO2 95 %     Weight      Height      Head Circumference      Peak Flow      Pain Score 10     Pain Loc      Pain Education      Exclude from Growth Chart    No data found.  Updated Vital Signs BP 120/80 (BP Location: Right Arm)   Pulse 92  Temp 98 F (36.7 C) (Oral)   Resp 20   LMP 05/26/2014   SpO2 95%   Visual Acuity Right Eye Distance:   Left Eye Distance:   Bilateral Distance:    Right Eye Near:   Left Eye Near:    Bilateral Near:     Physical Exam Vitals and nursing note reviewed.  Constitutional:      Appearance: Normal appearance. She is not ill-appearing.  HENT:     Head: Atraumatic.  Eyes:     Extraocular Movements: Extraocular movements intact.     Conjunctiva/sclera: Conjunctivae normal.  Cardiovascular:     Rate and Rhythm: Normal rate and regular rhythm.     Heart sounds: Normal heart sounds.  Pulmonary:     Effort: Pulmonary effort is normal.     Breath sounds: Normal breath sounds.  Abdominal:     General: Bowel sounds are normal. There is no distension.     Palpations: Abdomen is soft.     Tenderness: There is no abdominal tenderness. There is no right CVA tenderness, left CVA tenderness or guarding.  Musculoskeletal:        General: Tenderness present. No swelling. Normal range of motion.     Cervical back: Normal range of motion and neck supple.  Skin:    General: Skin is warm and dry.     Findings: No bruising or erythema.  Neurological:     Mental Status: She is alert and oriented to person, place, and time.     Comments: Bilateral lower extremities neurovascularly intact  Psychiatric:        Mood and Affect: Mood normal.        Thought Content: Thought content normal.        Judgment: Judgment normal.      UC Treatments / Results  Labs (all labs  ordered are listed, but only abnormal results are displayed) Labs Reviewed  POCT URINALYSIS DIP (MANUAL ENTRY) - Abnormal; Notable for the following components:      Result Value   Clarity, UA cloudy (*)    Blood, UA moderate (*)    Protein Ur, POC =30 (*)    Leukocytes, UA Large (3+) (*)    All other components within normal limits  URINE CULTURE    EKG   Radiology No results found.  Procedures Procedures (including critical care time)  Medications Ordered in UC Medications  dexamethasone (DECADRON) injection 10 mg (10 mg Intramuscular Given 11/27/23 1321)    Initial Impression / Assessment and Plan / UC Course  I have reviewed the triage vital signs and the nursing notes.  Pertinent labs & imaging results that were available during my care of the patient were reviewed by me and considered in my medical decision making (see chart for details).     Urinalysis today with evidence of urinary tract infection.  Urine culture pending, treat with Keflex awaiting results and adjust if needed.  IM Decadron for arthritis flare of the right knee.  Follow-up with PCP for recheck.  Final Clinical Impressions(s) / UC Diagnoses   Final diagnoses:  Acute lower UTI  Arthritis of knee     Discharge Instructions      Given you a steroid injection today to help with your knee pain.  Follow-up with your primary care for orthopedist for further management.  We have sent your urine out for culture today for further evaluation of your suspected urinary tract infection.  We are treating with antibiotics and we will  let you know if we need to make any changes.    ED Prescriptions     Medication Sig Dispense Auth. Provider   cephALEXin (KEFLEX) 500 MG capsule Take 1 capsule (500 mg total) by mouth 2 (two) times daily. 10 capsule Particia Nearing, [place]      PDMP not reviewed this encounter.   Particia Nearing, [place] 11/27/23 1323

## 2023-11-27 NOTE — Discharge Instructions (Signed)
Given you a steroid injection today to help with your knee pain.  Follow-up with your primary care for orthopedist for further management.  We have sent your urine out for culture today for further evaluation of your suspected urinary tract infection.  We are treating with antibiotics and we will let you know if we need to make any changes.

## 2023-11-27 NOTE — ED Triage Notes (Signed)
Pt reports she has low back pain x 2 days.states after drinking cranberry juice the pain subsided.  Pt states she also has right knee pain that is making it hard for her to walk since last night. Was getting cortisone shots but "she wasn't in pain" so she stopped.

## 2023-11-28 LAB — URINE CULTURE: Culture: NO GROWTH

## 2024-06-29 ENCOUNTER — Other Ambulatory Visit (HOSPITAL_COMMUNITY): Payer: Self-pay | Admitting: Family Medicine

## 2024-06-29 DIAGNOSIS — Z1231 Encounter for screening mammogram for malignant neoplasm of breast: Secondary | ICD-10-CM

## 2024-07-19 ENCOUNTER — Encounter (HOSPITAL_COMMUNITY): Payer: Self-pay

## 2024-07-19 ENCOUNTER — Ambulatory Visit (HOSPITAL_COMMUNITY)
Admission: RE | Admit: 2024-07-19 | Discharge: 2024-07-19 | Disposition: A | Discharge status: Home / Self Care | Source: Ambulatory Visit | Attending: Family Medicine | Admitting: Family Medicine

## 2024-07-19 DIAGNOSIS — Z1231 Encounter for screening mammogram for malignant neoplasm of breast: Secondary | ICD-10-CM | POA: Insufficient documentation

## 2024-10-26 ENCOUNTER — Encounter (HOSPITAL_BASED_OUTPATIENT_CLINIC_OR_DEPARTMENT_OTHER): Payer: Self-pay | Admitting: Orthopedic Surgery

## 2024-10-31 ENCOUNTER — Encounter (HOSPITAL_BASED_OUTPATIENT_CLINIC_OR_DEPARTMENT_OTHER)
Admission: RE | Admit: 2024-10-31 | Discharge: 2024-10-31 | Disposition: A | Discharge status: Home / Self Care | Source: Ambulatory Visit | Attending: Orthopedic Surgery | Admitting: Orthopedic Surgery

## 2024-10-31 DIAGNOSIS — E66813 Obesity, class 3: Secondary | ICD-10-CM | POA: Diagnosis not present

## 2024-10-31 DIAGNOSIS — M19071 Primary osteoarthritis, right ankle and foot: Secondary | ICD-10-CM | POA: Diagnosis not present

## 2024-10-31 DIAGNOSIS — T8484XA Pain due to internal orthopedic prosthetic devices, implants and grafts, initial encounter: Secondary | ICD-10-CM | POA: Diagnosis not present

## 2024-10-31 DIAGNOSIS — I10 Essential (primary) hypertension: Secondary | ICD-10-CM | POA: Diagnosis not present

## 2024-10-31 DIAGNOSIS — Z01818 Encounter for other preprocedural examination: Secondary | ICD-10-CM | POA: Diagnosis present

## 2024-10-31 LAB — BASIC METABOLIC PANEL WITH GFR
Anion gap: 9 (ref 5–15)
BUN: 14 mg/dL (ref 8–23)
CO2: 24 mmol/L (ref 22–32)
Calcium: 9.6 mg/dL (ref 8.9–10.3)
Chloride: 108 mmol/L (ref 98–111)
Creatinine, Ser: 0.97 mg/dL (ref 0.44–1.00)
GFR, Estimated: >60 mL/min (ref >60)
Glucose, Bld: 77 mg/dL (ref 70–99)
Potassium: 4 mmol/L (ref 3.5–5.1)
Sodium: 141 mmol/L (ref 135–145)

## 2024-10-31 NOTE — Progress Notes (Signed)

## 2024-11-02 ENCOUNTER — Ambulatory Visit (HOSPITAL_BASED_OUTPATIENT_CLINIC_OR_DEPARTMENT_OTHER)
Admission: RE | Admit: 2024-11-02 | Discharge: 2024-11-02 | Disposition: A | Discharge status: Home / Self Care | Attending: Orthopedic Surgery | Admitting: Orthopedic Surgery

## 2024-11-02 ENCOUNTER — Ambulatory Visit (HOSPITAL_BASED_OUTPATIENT_CLINIC_OR_DEPARTMENT_OTHER): Admitting: Anesthesiology

## 2024-11-02 ENCOUNTER — Other Ambulatory Visit: Payer: Self-pay

## 2024-11-02 ENCOUNTER — Encounter (HOSPITAL_BASED_OUTPATIENT_CLINIC_OR_DEPARTMENT_OTHER)
Admission: RE | Disposition: A | Discharge status: Home / Self Care | Payer: Self-pay | Source: Home / Self Care | Attending: Orthopedic Surgery

## 2024-11-02 ENCOUNTER — Encounter (HOSPITAL_BASED_OUTPATIENT_CLINIC_OR_DEPARTMENT_OTHER): Payer: Self-pay | Admitting: Orthopedic Surgery

## 2024-11-02 ENCOUNTER — Ambulatory Visit (HOSPITAL_BASED_OUTPATIENT_CLINIC_OR_DEPARTMENT_OTHER)

## 2024-11-02 DIAGNOSIS — E669 Obesity, unspecified: Secondary | ICD-10-CM

## 2024-11-02 DIAGNOSIS — I10 Essential (primary) hypertension: Secondary | ICD-10-CM | POA: Diagnosis not present

## 2024-11-02 DIAGNOSIS — T8484XA Pain due to internal orthopedic prosthetic devices, implants and grafts, initial encounter: Secondary | ICD-10-CM

## 2024-11-02 DIAGNOSIS — Z6841 Body Mass Index (BMI) 40.0 and over, adult: Secondary | ICD-10-CM

## 2024-11-02 DIAGNOSIS — Y831 Surgical operation with implant of artificial internal device as the cause of abnormal reaction of the patient, or of later complication, without mention of misadventure at the time of the procedure: Secondary | ICD-10-CM | POA: Insufficient documentation

## 2024-11-02 DIAGNOSIS — E66813 Obesity, class 3: Secondary | ICD-10-CM | POA: Insufficient documentation

## 2024-11-02 DIAGNOSIS — M19071 Primary osteoarthritis, right ankle and foot: Secondary | ICD-10-CM | POA: Diagnosis not present

## 2024-11-02 HISTORY — PX: FOOT ARTHRODESIS: SHX1655

## 2024-11-02 HISTORY — PX: HARDWARE REMOVAL: SHX979

## 2024-11-02 SURGERY — REMOVAL, HARDWARE
Anesthesia: General | Site: Foot | Laterality: Left

## 2024-11-02 MED ORDER — EPHEDRINE SULFATE (PRESSORS) 25 MG/5ML IV SOSY
PREFILLED_SYRINGE | INTRAVENOUS | Status: DC | PRN
Start: 2024-11-02 — End: 2024-11-02
  Administered 2024-11-02: 10 mg via INTRAVENOUS

## 2024-11-02 MED ORDER — FENTANYL CITRATE (PF) 100 MCG/2ML IJ SOLN
INTRAMUSCULAR | Status: AC
  Filled 2024-11-02: qty 2

## 2024-11-02 MED ORDER — 0.9 % SODIUM CHLORIDE (POUR BTL) OPTIME
TOPICAL | Status: DC | PRN
Start: 2024-11-02 — End: 2024-11-02
  Administered 2024-11-02: 600 mL

## 2024-11-02 MED ORDER — CEFAZOLIN SODIUM-DEXTROSE 3-4 GM/150ML-% IV SOLN
3.0000 g | INTRAVENOUS | Status: AC
  Administered 2024-11-02: 3 g via INTRAVENOUS

## 2024-11-02 MED ORDER — LIDOCAINE HCL (CARDIAC) PF 100 MG/5ML IV SOSY
PREFILLED_SYRINGE | INTRAVENOUS | Status: DC | PRN
Start: 2024-11-02 — End: 2024-11-02
  Administered 2024-11-02: 100 mg via INTRAVENOUS

## 2024-11-02 MED ORDER — SODIUM CHLORIDE 0.9 % IV SOLN: INTRAVENOUS | Status: DC

## 2024-11-02 MED ORDER — RIVAROXABAN 10 MG PO TABS: 10.0000 mg | ORAL_TABLET | Freq: Every day | ORAL | 0 refills | Status: DC

## 2024-11-02 MED ORDER — ACETAMINOPHEN 500 MG PO TABS
ORAL_TABLET | ORAL | Status: AC
  Filled 2024-11-02: qty 2

## 2024-11-02 MED ORDER — ONDANSETRON HCL 4 MG/2ML IJ SOLN
INTRAMUSCULAR | Status: DC | PRN
  Administered 2024-11-02: 4 mg via INTRAVENOUS

## 2024-11-02 MED ORDER — CEFAZOLIN SODIUM-DEXTROSE 1-4 GM/50ML-% IV SOLN
INTRAVENOUS | Status: AC
  Filled 2024-11-02: qty 50

## 2024-11-02 MED ORDER — DROPERIDOL 2.5 MG/ML IJ SOLN: 0.6250 mg | Freq: Once | INTRAMUSCULAR | Status: DC | PRN

## 2024-11-02 MED ORDER — FENTANYL CITRATE (PF) 100 MCG/2ML IJ SOLN
25.0000 ug | INTRAMUSCULAR | Status: DC | PRN
  Administered 2024-11-02 (×2): 50 ug via INTRAVENOUS

## 2024-11-02 MED ORDER — FENTANYL CITRATE (PF) 100 MCG/2ML IJ SOLN
INTRAMUSCULAR | Status: DC | PRN
  Administered 2024-11-02 (×4): 50 ug via INTRAVENOUS

## 2024-11-02 MED ORDER — HYDROCODONE-ACETAMINOPHEN 5-325 MG PO TABS
ORAL_TABLET | ORAL | Status: AC
  Filled 2024-11-02: qty 1

## 2024-11-02 MED ORDER — ACETAMINOPHEN 500 MG PO TABS
1000.0000 mg | ORAL_TABLET | Freq: Once | ORAL | Status: AC
  Administered 2024-11-02: 1000 mg via ORAL

## 2024-11-02 MED ORDER — PROPOFOL 10 MG/ML IV BOLUS
INTRAVENOUS | Status: DC | PRN
  Administered 2024-11-02: 200 mg via INTRAVENOUS
  Administered 2024-11-02: 100 mg via INTRAVENOUS

## 2024-11-02 MED ORDER — HYDROCODONE-ACETAMINOPHEN 5-325 MG PO TABS: 1.0000 | ORAL_TABLET | Freq: Four times a day (QID) | ORAL | 0 refills | Status: AC | PRN

## 2024-11-02 MED ORDER — DEXMEDETOMIDINE HCL IN NACL 80 MCG/20ML IV SOLN
INTRAVENOUS | Status: DC | PRN
  Administered 2024-11-02: 8 ug via INTRAVENOUS

## 2024-11-02 MED ORDER — ACETAMINOPHEN 10 MG/ML IV SOLN: 1000.0000 mg | Freq: Once | INTRAVENOUS | Status: DC | PRN

## 2024-11-02 MED ORDER — DOCUSATE SODIUM 100 MG PO CAPS: 100.0000 mg | ORAL_CAPSULE | Freq: Two times a day (BID) | ORAL | 0 refills | Status: AC

## 2024-11-02 MED ORDER — MIDAZOLAM HCL 2 MG/2ML IJ SOLN
INTRAMUSCULAR | Status: AC
Start: 2024-11-02 — End: 2024-11-02
  Filled 2024-11-02: qty 2

## 2024-11-02 MED ORDER — CEFAZOLIN SODIUM-DEXTROSE 2-4 GM/100ML-% IV SOLN
INTRAVENOUS | Status: AC
  Filled 2024-11-02: qty 100

## 2024-11-02 MED ORDER — MIDAZOLAM HCL (PF) 2 MG/2ML IJ SOLN
2.0000 mg | Freq: Once | INTRAMUSCULAR | Status: AC
  Administered 2024-11-02: 2 mg via INTRAVENOUS

## 2024-11-02 MED ORDER — SENNA 8.6 MG PO TABS: 2.0000 | ORAL_TABLET | Freq: Two times a day (BID) | ORAL | 0 refills | Status: DC

## 2024-11-02 MED ORDER — PHENYLEPHRINE HCL (PRESSORS) 10 MG/ML IV SOLN
INTRAVENOUS | Status: DC | PRN
  Administered 2024-11-02 (×6): 80 ug via INTRAVENOUS

## 2024-11-02 MED ORDER — HYDROCODONE-ACETAMINOPHEN 5-325 MG PO TABS
1.0000 | ORAL_TABLET | Freq: Once | ORAL | Status: AC
Start: 2024-11-02 — End: 2024-11-02
  Administered 2024-11-02: 1 via ORAL

## 2024-11-02 MED ORDER — DEXAMETHASONE SOD PHOSPHATE PF 10 MG/ML IJ SOLN
INTRAMUSCULAR | Status: DC | PRN
Start: 2024-11-02 — End: 2024-11-02
  Administered 2024-11-02: 4 mg via INTRAVENOUS

## 2024-11-02 MED ORDER — BUPIVACAINE HCL (PF) 0.25 % IJ SOLN
INTRAMUSCULAR | Status: DC | PRN
  Administered 2024-11-02: 20 mL via PERINEURAL

## 2024-11-02 MED ORDER — VANCOMYCIN HCL 500 MG IV SOLR
INTRAVENOUS | Status: DC | PRN
Start: 2024-11-02 — End: 2024-11-02
  Administered 2024-11-02: 500 mg via TOPICAL

## 2024-11-02 MED ORDER — PROPOFOL 10 MG/ML IV BOLUS
INTRAVENOUS | Status: AC
Start: 2024-11-02 — End: 2024-11-02
  Filled 2024-11-02: qty 20

## 2024-11-02 MED ORDER — LACTATED RINGERS IV SOLN: INTRAVENOUS | Status: DC

## 2024-11-02 SURGICAL SUPPLY — 67 items
BENZOIN TINCTURE PRP APPL 2/3 (GAUZE/BANDAGES/DRESSINGS) IMPLANT
BIT DRILL 4.8X200 CANN (BIT) IMPLANT
BIT DRILL CANN 3.5MM (DRILL) IMPLANT
BIT TREPHINE CORING 8 (BIT) IMPLANT
BLADE AVERAGE 25X9 (BLADE) IMPLANT
BLADE MICRO SAGITTAL (BLADE) IMPLANT
BLADE SURG 15 STRL LF DISP TIS (BLADE) ×2 IMPLANT
BNDG COMPR ESMARK 4X3 LF (GAUZE/BANDAGES/DRESSINGS) IMPLANT
BNDG COMPR ESMARK 6X3 LF (GAUZE/BANDAGES/DRESSINGS) IMPLANT
BNDG ELASTIC 4INX 5YD STR LF (GAUZE/BANDAGES/DRESSINGS) ×1 IMPLANT
BNDG ELASTIC 6INX 5YD STR LF (GAUZE/BANDAGES/DRESSINGS) ×1 IMPLANT
BUR MIS CONICAL WEDGE 4.3X13 (BURR) IMPLANT
CHLORAPREP W/TINT 26 (MISCELLANEOUS) ×1 IMPLANT
COVER BACK TABLE 60X90IN (DRAPES) ×1 IMPLANT
CUFF TOURN SGL QUICK 42 (TOURNIQUET CUFF) IMPLANT
CUFF TRNQT CYL 34X4.125X (TOURNIQUET CUFF) IMPLANT
DRAPE EXTREMITY T 121X128X90 (DISPOSABLE) ×1 IMPLANT
DRAPE OEC MINIVIEW 54X84 (DRAPES) ×1 IMPLANT
DRAPE SURG 17X23 STRL (DRAPES) IMPLANT
DRAPE U-SHAPE 47X51 STRL (DRAPES) ×1 IMPLANT
DRSG MEPITEL 4X7.2 (GAUZE/BANDAGES/DRESSINGS) ×1 IMPLANT
ELECTRODE REM PT RTRN 9FT ADLT (ELECTROSURGICAL) ×1 IMPLANT
GAUZE PAD ABD 8X10 STRL (GAUZE/BANDAGES/DRESSINGS) ×2 IMPLANT
GAUZE SPONGE 4X4 12PLY STRL (GAUZE/BANDAGES/DRESSINGS) ×1 IMPLANT
GLOVE BIO SURGEON STRL SZ8 (GLOVE) ×1 IMPLANT
GLOVE BIOGEL PI IND STRL 7.0 (GLOVE) IMPLANT
GLOVE BIOGEL PI IND STRL 7.5 (GLOVE) IMPLANT
GLOVE BIOGEL PI IND STRL 8 (GLOVE) ×2 IMPLANT
GLOVE ECLIPSE 8.0 STRL XLNG CF (GLOVE) ×1 IMPLANT
GLOVE SURG SS PI 7.0 STRL IVOR (GLOVE) IMPLANT
GOWN STRL REUS W/ TWL LRG LVL3 (GOWN DISPOSABLE) ×1 IMPLANT
GOWN STRL REUS W/ TWL XL LVL3 (GOWN DISPOSABLE) ×2 IMPLANT
KIT STAPLE ARCUS 16X13 STRL (Staple) IMPLANT
KWIRE FX200X1.8XTROC TIP (WIRE) IMPLANT
LOOP VASCLR MAXI BLUE 18IN ST (MISCELLANEOUS) IMPLANT
LOOP VESSEL MINI RED (MISCELLANEOUS) IMPLANT
NDL HYPO 22X1.5 SAFETY MO (MISCELLANEOUS) ×1 IMPLANT
NDL SAFETY ECLIPSE 18X1.5 (NEEDLE) IMPLANT
NEEDLE HYPO 22X1.5 SAFETY MO (MISCELLANEOUS) ×1 IMPLANT
PACK BASIN DAY SURGERY FS (CUSTOM PROCEDURE TRAY) ×1 IMPLANT
PAD CAST 4YDX4 CTTN HI CHSV (CAST SUPPLIES) ×1 IMPLANT
PADDING CAST COTTON 6X4 STRL (CAST SUPPLIES) ×1 IMPLANT
PENCIL SMOKE EVACUATOR (MISCELLANEOUS) ×1 IMPLANT
PIN GUIDE DRILL TIP 2.8X300 (DRILL) IMPLANT
SANITIZER HAND ALTRA PUMP 550 (MISCELLANEOUS) ×1 IMPLANT
SCREW CANN 5.0X30MM (Screw) IMPLANT
SCREW CANNULATED 6.5X60 KNEE (Screw) IMPLANT
SHEET MEDIUM DRAPE 40X70 STRL (DRAPES) ×1 IMPLANT
SLEEVE SCD COMPRESS KNEE MED (STOCKING) ×1 IMPLANT
SPIKE FLUID TRANSFER (MISCELLANEOUS) IMPLANT
SPLINT PLASTER CAST FAST 5X30 (CAST SUPPLIES) ×20 IMPLANT
SPONGE SURGIFOAM ABS GEL 12-7 (HEMOSTASIS) IMPLANT
SPONGE T-LAP 18X18 ~~LOC~~+RFID (SPONGE) ×1 IMPLANT
STOCKINETTE 6 STRL (DRAPES) ×1 IMPLANT
STRIP CLOSURE SKIN 1/2X4 (GAUZE/BANDAGES/DRESSINGS) IMPLANT
SUCTION TUBE FRAZIER 10FR DISP (SUCTIONS) ×1 IMPLANT
SUT ETHILON 3 0 PS 1 (SUTURE) ×1 IMPLANT
SUT MNCRL AB 3-0 PS2 18 (SUTURE) ×1 IMPLANT
SUT VIC AB 2-0 SH 18 (SUTURE) IMPLANT
SUT VIC AB 2-0 SH 27XBRD (SUTURE) ×1 IMPLANT
SUT VICRYL 0 SH 27 (SUTURE) IMPLANT
SYR BULB EAR ULCER 3OZ GRN STR (SYRINGE) ×1 IMPLANT
SYR CONTROL 10ML LL (SYRINGE) IMPLANT
TOWEL GREEN STERILE FF (TOWEL DISPOSABLE) ×2 IMPLANT
TUBE CONNECTING 20X1/4 (TUBING) ×1 IMPLANT
UNDERPAD 30X36 HEAVY ABSORB (UNDERPADS AND DIAPERS) ×1 IMPLANT
YANKAUER SUCT BULB TIP NO VENT (SUCTIONS) IMPLANT

## 2024-11-02 NOTE — Transfer of Care (Signed)
 Immediate Anesthesia Transfer of Care Note  Patient: [name]  Procedure(s) Performed: Left foot removal of deep implants (Left: Foot) Subtalar joint and talonavicular joint arthrodesis (Left: Foot)  Patient Location: PACU  Anesthesia Type:General  Level of Consciousness: sedated  Airway & Oxygen Therapy: Patient connected to nasal cannula oxygen  Post-op Assessment: Report given to RN and Post -op Vital signs reviewed and stable  Post vital signs: Reviewed and stable  Last Vitals:  Vitals Value Taken Time  BP 110/57 11/02/24 11:19  Temp    Pulse 78 11/02/24 11:21  Resp 13 11/02/24 11:20  SpO2 96 % 11/02/24 11:21  Vitals shown include unfiled device data.  Last Pain:  Vitals:   11/02/24 0817  TempSrc: Temporal  PainSc: 0-No pain      Patients Stated Pain Goal: 4 (11/02/24 0817)  Complications: No notable events documented.

## 2024-11-02 NOTE — Op Note (Signed)
 11/02/2024  11:14 AM  PATIENT:  [name]  63 y.o. female  PRE-OPERATIVE DIAGNOSIS: 1.  Painful hardware right calcaneus 2.  Right subtalar joint arthritis 3.  Right talonavicular joint arthritis  POST-OPERATIVE DIAGNOSIS: Same  Procedure(s): 1.  Removal of deep implants from the right calcaneus 2.  Right subtalar joint arthrodesis (separate incision) 3.  Right navicular joint arthrodesis (separate incision) 4.  Right ankle AP and lateral radiographs 5.  Right foot AP, lateral and Harris heel radiographs  SURGEON:  Norleen Armor, MD  ASSISTANT: Eva Barrack, PA-C  ANESTHESIA:   General, regional  EBL:  minimal   TOURNIQUET:   Total Tourniquet Time Documented: Thigh (Left) - 77 minutes Total: Thigh (Left) - 77 minutes  COMPLICATIONS:  None apparent  DISPOSITION:  Extubated, awake and stable to recovery.  INDICATION FOR PROCEDURE: 63 year old female with past medical history significant for obesity underwent flatfoot reconstruction with calcaneus osteotomy and FDL transfer about 18 months ago.  She has continued to have pain after surgery including painful hardware and talonavicular and subtalar joint arthritis.  She has failed nonoperative treatment and presents today for removal of the painful hardware and arthrodeses of the talonavicular and subtalar joints.  PROCEDURE IN DETAIL:  After pre operative consent was obtained, and the correct operative site was identified, the patient was brought to the operating room and placed supine on the OR table.  Anesthesia was administered.  Pre-operative antibiotics were administered.  A surgical timeout was taken.  The left lower extremity was prepped and draped in standard sterile fashion with a tourniquet at the thigh.  The extremity was elevated, and the tourniquet was inflated to 350 mmHg.  A guidepin for the Arthrex cannulated screws was inserted percutaneously into the heel.  Under fluoroscopic guidance a guidepin was inserted  into the more lateral screw.  An incision was made.  Dissection was carried down to the screw head.  The screw was removed without difficulty.  The guidepin was then medialization down to the medial screw.  The screw was removed without difficulty.  Attention was turned to the lateral hindfoot.  An incision was made along the sinus Tarsi.  Dissection was carried sharply down through the subcutaneous tissues.  The subtalar joint was entered.  The cerebral ligament was divided.  A lamina spreader was used to open the joint.  The remaining articular cartilage and subchondral bone was removed from both sides of the joint with periosteal elevator, curettes and a conical bur.  The wound was irrigated copiously.  Attention was turned to the dorsum of the hindfoot.  A longitudinal incision was made.  Dissection was carried sharply down through the subcutaneous tissues.  The interval between the EHL and tibialis anterior was developed.  The talonavicular joint was identified.  The joint capsule was incised and elevated medially and laterally taking care to protect the neurovascular bundle.  The remaining articular cartilage and subchondral bone were removed from both sides of the joint using an elevator, curettes and a conical bur.  The wound was irrigated copiously.  Subtalar joint was reduced.  A guidepin was inserted from the dorsal talar neck and advanced across the subtalar joint into the anterior process of the calcaneus.  Lateral and Harris heel views showed appropriate position of the guidepin.  It was overdrilled.  A 6.5 mm partially-threaded Zimmer [employer] cannulated screw was inserted.  Screw was noted to have excellent purchase and compressed the subtalar joint appropriately.  A second guidepin was then inserted  from the calcaneus tuberosity across the posterior facet and into the dome of the talus.  AP and lateral ankle films showed appropriate position of the guidepin.  Guidepin was overdrilled.  A second  6.5 mm partially-threaded cannulated screw was inserted.  It was noted to have excellent purchase and compressed the posterior facet appropriately.  The talonavicular joint was then reduced.  Guidepin for the 5 mm Zimmer [employer] cannulated screws was inserted across the medial half of the joint.  Radiographs confirmed appropriate position of the guidepin.  It was overdrilled.  A 30 mm x 5 mm partially-threaded cannulated screw was inserted.  Screw compressed the talonavicular joint appropriately.  The dorsum of the joint was then secured with a 16 mm Arcus staple.  AP and lateral radiographs of the ankle showed appropriate position of the hardware within the talar dome and no other acute injuries.  AP, lateral and Harris heel radiographs of the foot showed interval arthrodesis of the talonavicular and subtalar joints with appropriately positioned hardware.  No other acute injuries are noted.  The wounds were irrigated copiously and sprinkled with vancomycin  powder.  Extensor retinaculum was repaired with 2-0 Vicryl.  Subcutaneous tissues were approximated with 3-0 Monocryl.  Skin incisions were closed with 3-0 nylon.  Sterile dressings were applied followed by a well-padded short leg splint.  The tourniquet was released after application of dressings.  Patient was awakened from anesthesia and transported to the recovery room in stable condition.   FOLLOW UP PLAN: Nonweightbearing on the left lower extremity.  Xarelto  for DVT prophylaxis.  Follow-up in 2 weeks for suture removal and conversion to a short leg cast.   RADIOGRAPHS: AP and lateral radiographs of the left ankle are obtained intraoperatively.  These show interval arthrodesis of the subtalar joint.  Hardware in the talar dome is appropriately positioned.  No other ankle injuries are noted.  AP, lateral and Harris heel radiographs of the left foot are obtained intraoperatively.  These show interval arthrodesis of the subtalar and talonavicular  joints.  Hardware is appropriately positioned and of the appropriate lengths.  No other acute injuries are noted.    Justin Ollis PA-C was present and scrubbed for the duration of the operative case. His assistance was essential in positioning the patient, prepping and draping, gaining and maintaining exposure, performing the operation, closing and dressing the wounds and applying the splint.

## 2024-11-02 NOTE — Anesthesia Postprocedure Evaluation (Signed)
 Anesthesia Post Note  Patient: [name]  Procedure(s) Performed: Left foot removal of deep implants (Left: Foot) Subtalar joint and talonavicular joint arthrodesis (Left: Foot)     Patient location during evaluation: PACU Anesthesia Type: General Level of consciousness: awake and alert Pain management: pain level controlled Vital Signs Assessment: post-procedure vital signs reviewed and stable Respiratory status: spontaneous breathing, nonlabored ventilation, respiratory function stable and patient connected to nasal cannula oxygen Cardiovascular status: blood pressure returned to baseline and stable Postop Assessment: no apparent nausea or vomiting Anesthetic complications: no   No notable events documented.  Last Vitals:  Vitals:   11/02/24 1200 11/02/24 1243  BP: 127/69 (!) 158/70  Pulse: 74 80  Resp: 15 20  Temp:  (!) 36.2 C  SpO2: 100% 98%    Last Pain:  Vitals:   11/02/24 1243  TempSrc: Temporal  PainSc:                  Rome Ade

## 2024-11-02 NOTE — Anesthesia Preprocedure Evaluation (Signed)
 Anesthesia Evaluation  Patient identified by MRN, date of birth, ID band Patient awake    Reviewed: Allergy & Precautions, NPO status , Patient's Chart, lab work & pertinent test results  History of Anesthesia Complications Negative for: history of anesthetic complications  Airway Mallampati: II  TM Distance: >3 FB Neck ROM: Full    Dental no notable dental hx. (+) Teeth Intact   Pulmonary neg pulmonary ROS, neg sleep apnea, neg COPD, Patient abstained from smoking.Not current smoker   Pulmonary exam normal breath sounds clear to auscultation       Cardiovascular Exercise Tolerance: Good METShypertension, Pt. on medications (-) CAD and (-) Past MI (-) dysrhythmias  Rhythm:Regular Rate:Normal - Systolic murmurs    Neuro/Psych negative neurological ROS  negative psych ROS   GI/Hepatic ,neg GERD  ,,(+)     (-) substance abuse    Endo/Other  neg diabetes  Class 3 obesity  Renal/GU negative Renal ROS     Musculoskeletal   Abdominal  (+) + obese  Peds  Hematology   Anesthesia Other Findings Past Medical History: No date: Arthritis No date: Bronchitis No date: History of kidney stones No date: Hypertension No date: Obesity No date: Sickle cell trait  Reproductive/Obstetrics                              Anesthesia Physical Anesthesia Plan  ASA: 3  Anesthesia Plan: General   Post-op Pain Management: Regional block* and Tylenol  PO (pre-op)*   Induction: Intravenous  PONV Risk Score and Plan: 3 and Ondansetron , Dexamethasone  and Midazolam   Airway Management Planned: LMA  Additional Equipment: None  Intra-op Plan:   Post-operative Plan: Extubation in OR  Informed Consent: I have reviewed the patients History and Physical, chart, labs and discussed the procedure including the risks, benefits and alternatives for the proposed anesthesia with the patient or authorized  representative who has indicated his/her understanding and acceptance.     Dental advisory given  Plan Discussed with: CRNA and Surgeon  Anesthesia Plan Comments: (Discussed risks of anesthesia with patient, including PONV, sore throat, lip/dental/eye damage. Rare risks discussed as well, such as cardiorespiratory and neurological sequelae, and allergic reactions. Discussed the role of CRNA in patient's perioperative care. Patient understands. Patient informed about increased incidence of above perioperative risk due to high BMI. Patient understands.  Discussed r/b/a of popliteal nerve block, including:  - bleeding, infection, nerve damage - poor or non functioning block. - reactions and toxicity to local anesthetic Patient understands. )        Anesthesia Quick Evaluation

## 2024-11-02 NOTE — Discharge Instructions (Addendum)
 Renee Armor, MD [facility]  Please read the following information regarding your care after surgery.  Medications  You only need a prescription for the narcotic pain medicine (ex. oxycodone , Percocet, Norco).  All of the other medicines listed below are available over the counter. ? hydrocodone  as prescribed for severe pain  Narcotic pain medicine (ex. oxycodone , Percocet, Vicodin) will cause constipation.  To prevent this problem, take the following medicines while you are taking any pain medicine. ? docusate sodium  (Colace) 100 mg twice a day ? senna (Senokot) 2 tablets twice a day  ? To help prevent blood clots, take Xarelto  as prescribed for two weeks after surgery.  You should also get up every hour while you are awake to move around.    Weight Bearing ? Do not bear any weight on the operated leg or foot.  Cast / Splint / Dressing ? Keep your splint, cast or dressing clean and dry.  Don't put anything (coat hanger, pencil, etc) down inside of it.  If it gets damp, use a hair dryer on the cool setting to dry it.  If it gets soaked, call the office to schedule an appointment for a cast change.   After your dressing, cast or splint is removed; you may shower, but do not soak or scrub the wound.  Allow the water  to run over it, and then gently pat it dry.  Swelling It is normal for you to have swelling where you had surgery.  To reduce swelling and pain, keep your toes above your nose for at least 3 days after surgery.  It may be necessary to keep your foot or leg elevated for several weeks.  If it hurts, it should be elevated.  Follow Up Call my office at [phone] when you are discharged from the hospital or surgery center to schedule an appointment to be seen two weeks after surgery.  Call my office at [phone] if you develop a fever >101.5 F, nausea, vomiting, bleeding from the surgical site or severe pain.      Post Anesthesia Home Care Instructions  Activity: Get  plenty of rest for the remainder of the day. A responsible individual must stay with you for 24 hours following the procedure.  For the next 24 hours, DO NOT: -Drive a car -Advertising copywriter -Drink alcoholic beverages -Take any medication unless instructed by your physician -Make any legal decisions or sign important papers.  Meals: Start with liquid foods such as gelatin or soup. Progress to regular foods as tolerated. Avoid greasy, spicy, heavy foods. If nausea and/or vomiting occur, drink only clear liquids until the nausea and/or vomiting subsides. Call your physician if vomiting continues.  Special Instructions/Symptoms: Your throat may feel dry or sore from the anesthesia or the breathing tube placed in your throat during surgery. If this causes discomfort, gargle with warm salt water . The discomfort should disappear within 24 hours.  Regional Anesthesia Blocks  1. You may not be able to move or feel the blocked extremity after a regional anesthetic block. This may last may last from 3-48 hours after placement, but it will go away. The length of time depends on the medication injected and your individual response to the medication. As the nerves start to wake up, you may experience tingling as the movement and feeling returns to your extremity. If the numbness and inability to move your extremity has not gone away after 48 hours, please call your surgeon.   2. The extremity that is blocked will  need to be protected until the numbness is gone and the strength has returned. Because you cannot feel it, you will need to take extra care to avoid injury. Because it may be weak, you may have difficulty moving it or using it. You may not know what position it is in without looking at it while the block is in effect.  3. For blocks in the legs and feet, returning to weight bearing and walking needs to be done carefully. You will need to wait until the numbness is entirely gone and the strength has  returned. You should be able to move your leg and foot normally before you try and bear weight or walk. You will need someone to be with you when you first try to ensure you do not fall and possibly risk injury.  4. Bruising and tenderness at the needle site are common side effects and will resolve in a few days.  5. Persistent numbness or new problems with movement should be communicated to the surgeon or the [facility] [phone] [facility] [phone]).  Next dose of tylenol  will be at 2:20pm

## 2024-11-02 NOTE — Anesthesia Procedure Notes (Addendum)
 Anesthesia Regional Block: Popliteal block   Pre-Anesthetic Checklist: , timeout performed,  Correct Patient, Correct Site, Correct Laterality,  Correct Procedure, Correct Position, site marked,  Risks and benefits discussed,  Surgical consent,  Pre-op evaluation,  At surgeon's request and post-op pain management  Laterality: Lower and Left  Prep: chloraprep       Needles:  Injection technique: Single-shot  Needle Type: Echogenic Needle     Needle Length: 9cm  Needle Gauge: 21     Additional Needles:   Procedures:,,,, ultrasound used (permanent image in chart),,    Narrative:  Start time: 11/02/2024 8:30 AM End time: 11/02/2024 8:32 AM Injection made incrementally with aspirations every 5 mL.  Performed by: Personally  Anesthesiologist: Boone Fess, MD  Additional Notes: Patient's chart reviewed and they were deemed appropriate candidate for procedure, at surgeon's request. Patient educated about risks, benefits, and alternatives of the block including but not limited to: temporary or permanent nerve damage, bleeding, infection, damage to surround tissues, block failure, local anesthetic toxicity. Patient expressed understanding. A formal time-out was conducted consistent with institution rules.  Monitors were applied, and minimal sedation used. The site was prepped with skin prep and allowed to dry, and sterile gloves were used. A high frequency linear ultrasound probe with probe cover was utilized throughout. Popliteal artery pulsatile and visualized in popliteal fossa along with adjacent sciatic nerve and its branch point, which appeared anatomically normal, local anesthetic injected around them just proximal to the branch point, and echogenic block needle trajectory was monitored throughout. Aspiration performed every 5ml. Blood vessels were avoided. All injections were performed without resistance and free of blood and paresthesias. The patient tolerated the procedure  well.  Injectate: 20ml 0.25% bupivacaine 

## 2024-11-02 NOTE — Anesthesia Procedure Notes (Signed)
 Procedure Name: LMA Insertion Date/Time: 11/02/2024 9:34 AM  Performed by: Julieanne Fairy BROCKS, CRNAPre-anesthesia Checklist: Patient identified, Emergency Drugs available, Suction available and Patient being monitored Patient Re-evaluated:Patient Re-evaluated prior to induction Oxygen Delivery Method: Circle system utilized Preoxygenation: Pre-oxygenation with 100% oxygen Induction Type: IV induction Ventilation: Mask ventilation without difficulty LMA: LMA inserted LMA Size: 4.0 Number of attempts: 1 Airway Equipment and Method: Bite block Placement Confirmation: positive ETCO2 Tube secured with: Tape Dental Injury: Teeth and Oropharynx as per pre-operative assessment

## 2024-11-02 NOTE — H&P (Signed)
 [name] is an 63 y.o. female.   Chief Complaint: left foot pain HPI: 63 y/o female with PMH of obesity c/o L foot pain getting worse since flatfoot reconstruction over a year ago.  She has had progressive deformity of the foot and also has painful hardware at the calcaneus osteotomy site.  She has failed nonop treatment and presents for left TN and ST arthrodeses with hardware removal.  Past Medical History:  Diagnosis Date   Arthritis    Bronchitis    History of kidney stones    Hypertension    Obesity    Sickle cell trait     Past Surgical History:  Procedure Laterality Date   ABDOMINAL HYSTERECTOMY N/A 07/10/2014   Procedure: HYSTERECTOMY ABDOMINAL;  Surgeon: Norleen Edsel GAILS, MD;  Location: [facility];  Service: Gynecology;  Laterality: N/A;   BACK SURGERY     CALCANEAL OSTEOTOMY Left 04/08/2023   Procedure: Calcaneal Osteotomy;  Surgeon: Kit Norleen, MD;  Location: [facility];  Service: Orthopedics;  Laterality: Left;   CATARACT EXTRACTION W/PHACO Left 10/10/2018   Procedure: CATARACT EXTRACTION PHACO AND INTRAOCULAR LENS PLACEMENT (IOC);  Surgeon: Perley Hamilton, MD;  Location: [facility];  Service: Ophthalmology;  Laterality: Left;  CDE: 2.63   CATARACT EXTRACTION W/PHACO Right 10/27/2018   Procedure: CATARACT EXTRACTION PHACO AND INTRAOCULAR LENS PLACEMENT RIGHT EYE ;  Surgeon: Perley Hamilton, MD;  Location: [facility];  Service: Ophthalmology;  Laterality: Right;  right   COLONOSCOPY N/A 07/21/2013   Procedure: COLONOSCOPY;  Surgeon: Margo LITTIE Haddock, MD;  Location: [facility];  Service: Endoscopy;  Laterality: N/A;  10:15 AM   CYST EXCISION Right    knee   CYSTOSCOPY W/ URETERAL STENT PLACEMENT Right 02/18/2022   Procedure: CYSTOSCOPY WITH RETROGRADE PYELOGRAM/URETERAL STENT PLACEMENT;  Surgeon: Alvaro Hummer, MD;  Location: [facility];  Service: Urology;  Laterality: Right;   GASTROC RECESSION EXTREMITY Left 04/08/2023   Procedure: Gastroc recession;  Surgeon: Kit Norleen, MD;  Location: [facility];  Service: Orthopedics;  Laterality: Left;   HOLMIUM LASER APPLICATION Right 02/20/2022   Procedure: HOLMIUM LASER APPLICATION;  Surgeon: Alvaro Hummer, MD;  Location: [facility];  Service: Urology;  Laterality: Right;   NEPHROLITHOTOMY Right 02/18/2022   Procedure: FIRST STAGE NEPHROLITHOTOMY PERCUTANEOUS WITH SURGEON ACCESS;  Surgeon: Alvaro Hummer, MD;  Location: [facility];  Service: Urology;  Laterality: Right;  3 HRS   NEPHROLITHOTOMY Right 02/20/2022   Procedure: SECOND STAGENEPHROLITHOTOMY PERCUTANEOUS, NEPHROSTOMY TUBE CHANGE;  Surgeon: Alvaro Hummer, MD;  Location: [facility];  Service: Urology;  Laterality: Right;  3 HRS   NEPHROLITHOTOMY  02/23/2022   Procedure: NEPHROLITHOTOMY PERCUTANEOUS THIRD STAGE AND STENT PLACEMENT;  Surgeon: Alvaro Hummer, MD;  Location: [facility];  Service: Urology;;   POSTERIOR TIBIAL TENDON REPAIR Left 04/08/2023   Procedure: Left posterior tibialis debridement/tenolysis , left flexor digitorum longus tendon to navicular transfer;  Surgeon: Kit Norleen, MD;  Location: [facility];  Service: Orthopedics;  Laterality: Left;   SALPINGOOPHORECTOMY Bilateral 07/10/2014   Procedure: SALPINGO OOPHORECTOMY;  Surgeon: Norleen Edsel GAILS, MD;  Location: [facility];  Service: Gynecology;  Laterality: Bilateral;   TUBAL LIGATION     WRIST SURGERY      Family History  Problem Relation Age of Onset   Hypertension Mother    Sickle cell trait Mother    Heart attack Father    Sickle cell anemia Sister    Sickle cell trait Sister    Social History:  reports that she has never smoked. She has never used smokeless tobacco. She reports that she does not drink alcohol and does not use drugs.  Allergies:  Allergies  Allergen Reactions   Oxycodone  Shortness Of Breath    No medications prior to admission.    Results for orders placed or performed during the hospital encounter of 2024-11-21 (from the past 48 hours)  Basic  metabolic panel per protocol     Status: None   Collection Time: 10/31/24  1:13 PM  Result Value Ref Range   Sodium 141 135 - 145 mmol/L   Potassium 4.0 3.5 - 5.1 mmol/L   Chloride 108 98 - 111 mmol/L   CO2 24 22 - 32 mmol/L   Glucose, Bld 77 70 - 99 mg/dL    Comment: Glucose reference range applies only to samples taken after fasting for at least 8 hours.   BUN 14 8 - 23 mg/dL   Creatinine, Ser 9.02 0.44 - 1.00 mg/dL   Calcium 9.6 8.9 - 89.6 mg/dL   GFR, Estimated >39 >39 mL/min    Comment: (NOTE) Calculated using the CKD-EPI Creatinine Equation (2021)    Anion gap 9 5 - 15    Comment: Performed at [facility], [address] [place], [place] [address]   No results found.  Review of Systems  no recent f/c/n/v/w tloss  Height 5' 4.5 (1.638 m), weight 129.3 kg, last menstrual period 05/26/2014. Physical Exam  Wn wd overweight woman in n ad.  A and O.  EOMI.  Resp unlabored.  L foot with healed surgical incisions.  Skin is o/whealthy.  Active PF and DF of the ankle and toes.  Intact sens to LT around the foot.  Assessment/Plan Left talonavicular and subtalar arthritis, painful hardware - to the OR today for L ST and TN arthrodeses with removal of the deep implants from the calcaneus.  The risks and benefits of the alternative treatment options have been discussed in detail.  The patient wishes to proceed with surgery and specifically understands risks of bleeding, infection, nerve damage, blood clots, need for additional surgery, amputation and death.   Norleen Armor, MD 11/21/24, 7:32 AM

## 2024-11-02 NOTE — Progress Notes (Signed)
 Assisted Dr. Rome Ade with left, popliteal, ultrasound guided block. Side rails up, monitors on throughout procedure. See vital signs in flow sheet. Tolerated Procedure well.

## 2024-11-03 ENCOUNTER — Encounter (HOSPITAL_BASED_OUTPATIENT_CLINIC_OR_DEPARTMENT_OTHER): Payer: Self-pay | Admitting: Orthopedic Surgery

## 2024-11-03 ENCOUNTER — Emergency Department (HOSPITAL_COMMUNITY)
Admission: EM | Admit: 2024-11-03 | Discharge: 2024-11-04 | Disposition: A | Discharge status: Home / Self Care | Attending: Emergency Medicine | Admitting: Emergency Medicine

## 2024-11-03 DIAGNOSIS — M25562 Pain in left knee: Secondary | ICD-10-CM | POA: Diagnosis present

## 2024-11-03 DIAGNOSIS — Z7901 Long term (current) use of anticoagulants: Secondary | ICD-10-CM | POA: Insufficient documentation

## 2024-11-03 NOTE — Addendum Note (Signed)
 Addendum  created 11/03/24 2014 by Boone Fess, MD   Intraprocedure Staff edited

## 2024-11-04 ENCOUNTER — Other Ambulatory Visit: Payer: Self-pay

## 2024-11-04 ENCOUNTER — Encounter (HOSPITAL_COMMUNITY): Payer: Self-pay | Admitting: Emergency Medicine

## 2024-11-04 ENCOUNTER — Emergency Department (HOSPITAL_COMMUNITY)

## 2024-11-04 LAB — CBC WITH DIFFERENTIAL/PLATELET
Abs Immature Granulocytes: 0.04 K/uL (ref 0.00–0.07)
Basophils Absolute: 0 K/uL (ref 0.0–0.1)
Basophils Relative: 0 %
Eosinophils Absolute: 0.1 K/uL (ref 0.0–0.5)
Eosinophils Relative: 1 %
HCT: 38.7 % (ref 36.0–46.0)
Hemoglobin: 12.4 g/dL (ref 12.0–15.0)
Immature Granulocytes: 0 %
Lymphocytes Relative: 19 %
Lymphs Abs: 2 K/uL (ref 0.7–4.0)
MCH: 26.8 pg (ref 26.0–34.0)
MCHC: 32 g/dL (ref 30.0–36.0)
MCV: 83.8 fL (ref 80.0–100.0)
Monocytes Absolute: 1.4 K/uL — ABNORMAL HIGH (ref 0.1–1.0)
Monocytes Relative: 13 %
Neutro Abs: 7 K/uL (ref 1.7–7.7)
Neutrophils Relative %: 67 %
Platelets: 216 K/uL (ref 150–400)
RBC: 4.62 MIL/uL (ref 3.87–5.11)
RDW: 13.5 % (ref 11.5–15.5)
WBC: 10.6 K/uL — ABNORMAL HIGH (ref 4.0–10.5)
nRBC: 0 % (ref 0.0–0.2)

## 2024-11-04 LAB — BASIC METABOLIC PANEL WITH GFR
Anion gap: 13 (ref 5–15)
BUN: 8 mg/dL (ref 8–23)
CO2: 25 mmol/L (ref 22–32)
Calcium: 9.5 mg/dL (ref 8.9–10.3)
Chloride: 105 mmol/L (ref 98–111)
Creatinine, Ser: 0.78 mg/dL (ref 0.44–1.00)
GFR, Estimated: >60 mL/min (ref >60)
Glucose, Bld: 97 mg/dL (ref 70–99)
Potassium: 3.6 mmol/L (ref 3.5–5.1)
Sodium: 142 mmol/L (ref 135–145)

## 2024-11-04 MED ORDER — ENOXAPARIN SODIUM 150 MG/ML IJ SOSY
1.0000 mg/kg | PREFILLED_SYRINGE | Freq: Once | INTRAMUSCULAR | Status: AC
  Administered 2024-11-04: 123 mg via SUBCUTANEOUS
  Filled 2024-11-04: qty 1

## 2024-11-04 MED ORDER — MORPHINE SULFATE (PF) 4 MG/ML IV SOLN
4.0000 mg | Freq: Once | INTRAVENOUS | Status: AC
  Administered 2024-11-04: 4 mg via INTRAVENOUS
  Filled 2024-11-04: qty 1

## 2024-11-04 NOTE — ED Notes (Signed)
 Rec'd 50 mcg fentanyl  by EMS

## 2024-11-04 NOTE — ED Triage Notes (Signed)
 BIB EMS from home with pain in left knee starting today about 10 am. States she had surgery on left foot yesterday with [facility], she said they used a nerve block.

## 2024-11-04 NOTE — ED Provider Notes (Signed)
 [place] EMERGENCY DEPARTMENT AT [facility]  Provider Note  CSN: [id number] Arrival date & time: 11/03/24 2354  History Chief Complaint  Patient presents with   Knee Pain    [name] is a 63 y.o. female brought by EMS for 1 day of worsening L knee pain. She had hardware removed from L foot/ankle on 11/6 with a nerve block done during surgery. She reports increasing pain in the knee since then without any injuries. Pain is worse with movement, not improved with norco. She was unable to fill Xarelto  for post-op DVT prophylaxis due to cost, but has been taking ASA.    Home Medications Prior to Admission medications   Medication Sig Start Date End Date Taking? Authorizing Provider  cholecalciferol (VITAMIN D) 1000 UNITS tablet Take 1,000 Units by mouth daily.      [provider]  diphenhydramine -acetaminophen  (TYLENOL  PM) 25-500 MG TABS tablet Take 1 tablet by mouth at bedtime as needed (sleep).    [provider]  docusate sodium  (COLACE) 100 MG capsule Take 1 capsule (100 mg total) by mouth 2 (two) times daily. While taking narcotic pain medicine. 11/02/24   Aniceto Eva Grebe, PA-C  HYDROcodone -acetaminophen  (NORCO/VICODIN) 5-325 MG tablet Take 1 tablet by mouth every 6 (six) hours as needed for up to 5 days for moderate pain (pain score 4-6). 11/02/24 11/07/24  Aniceto Eva Grebe, PA-C  lisinopril -hydrochlorothiazide  (PRINZIDE ,ZESTORETIC ) 20-12.5 MG per tablet Take 1 tablet by mouth daily.      [provider]  olmesartan (BENICAR) 5 MG tablet Take by mouth daily.    [provider]  Omega-3 Fatty Acids (FISH OIL) 1200 MG CAPS Take 1,200 mg by mouth daily.    [provider]  oxymetazoline (AFRIN) 0.05 % nasal spray Place 1 spray into both nostrils 2 (two) times daily as needed for congestion.    [provider]  rivaroxaban  (XARELTO ) 10 MG TABS tablet Take 1 tablet (10 mg total) by mouth daily. 11/02/24   Aniceto Eva Grebe, PA-C  senna (SENOKOT) 8.6 MG TABS tablet Take 2 tablets (17.2 mg total) by mouth 2 (two) times daily. 11/02/24   Aniceto Eva Grebe, PA-C     Allergies    Oxycodone    Review of Systems   Review of Systems Please see HPI for pertinent positives and negatives  Physical Exam BP 132/86 (BP Location: Right Arm) Comment: Simultaneous filing. User may not have seen previous data.  Pulse 72 Comment: Simultaneous filing. User may not have seen previous data.  Temp 99.7 F (37.6 C) (Oral) Comment: Simultaneous filing. User may not have seen previous data. Comment (Src): Simultaneous filing. User may not have seen previous data.  Resp 16   Ht 5' 4 (1.626 m)   Wt 122.9 kg   LMP 05/26/2014   SpO2 99% Comment: Simultaneous filing. User may not have seen previous data.  BMI 46.52 kg/m   Physical Exam Vitals and nursing note reviewed.  Constitutional:      Appearance: She is obese.  HENT:     Head: Normocephalic.     Nose: Nose normal.  Eyes:     Extraocular Movements: Extraocular movements intact.  Pulmonary:     Effort: Pulmonary effort is normal.  Musculoskeletal:        General: Swelling and tenderness (L knee) present.     Cervical back: Neck supple.     Comments: LLE is in short leg splint, distal perfusion is normal.   Skin:  Findings: No rash (on exposed skin).  Neurological:     Mental Status: She is alert and oriented to person, place, and time.  Psychiatric:        Mood and Affect: Mood normal.     ED Results / Procedures / Treatments   EKG None  Procedures Procedures  Medications Ordered in the ED Medications  enoxaparin (LOVENOX) injection 123 mg (has no administration in time range)  morphine (PF) 4 MG/ML injection 4 mg (4 mg Intravenous Given 11/04/24 0103)    Initial Impression and Plan  Patient here for L knee pain after recent foot/ankle surgery the day before. She is tender to palpation, does not feel warm and no erythema to suggest  infection. Consider DVT although not consistent with joint pain, no proximal deep vein tenderness. Will check labs, xray. Pain medication for comfort.   ED Course   Clinical Course as of 11/04/24 0236  Sat Nov 04, 2024  0137 I personally viewed the images from radiology studies and agree with radiologist interpretation: Xray with arthritis, but otherwise unremarkable.  [CS]  0217 CBC is unremarkable.  [CS]  0233 BMP is unremarkable. Pain is improved. Suspect pain is from manipulation during surgery, but will give a dose of Lovenox and have her come back in AM for US . Continue pain medication at home. Ortho follow up, RTED for any other concerns.  [CS]    Clinical Course User Index [CS] Roselyn Carlin NOVAK, MD     MDM Rules/Calculators/A&P Medical Decision Making Problems Addressed: Acute pain of left knee: acute illness or injury  Amount and/or Complexity of Data Reviewed Labs: ordered. Decision-making details documented in ED Course. Radiology: ordered and independent interpretation performed. Decision-making details documented in ED Course.  Risk Prescription drug management. Parenteral controlled substances.     Final Clinical Impression(s) / ED Diagnoses Final diagnoses:  Acute pain of left knee    Rx / DC Orders ED Discharge Orders          Ordered    Lower Ext Left Venous US        Comments: IMPORTANT PATIENT INSTRUCTIONS:  Your ED provider has recommended an Outpatient Ultrasound.  Please call [phone] to schedule an appointment.  If your appointment is scheduled for a Saturday, Sunday or holiday, please go to the [facility] Emergency Department Registration Desk at least 15 minutes prior to your appointment time and tell them you are there for an ultrasound.    If your appointment is scheduled for a weekday (Monday-Friday), please go directly to the [facility] Radiology Department at least 15 minutes prior to your appointment time and tell them you are there  for an ultrasound.  Please call [phone] with questions.   11/04/24 0236             Roselyn Carlin NOVAK, MD 11/04/24 [phone]

## 2024-11-04 NOTE — ED Notes (Signed)
Xray and phleb at bedside.

## 2024-12-09 ENCOUNTER — Encounter (HOSPITAL_BASED_OUTPATIENT_CLINIC_OR_DEPARTMENT_OTHER): Payer: Self-pay | Admitting: Emergency Medicine

## 2025-01-02 ENCOUNTER — Ambulatory Visit

## 2025-01-02 VITALS — BP 145/98 | HR 71 | Ht 64.0 in | Wt 280.4 lb

## 2025-01-02 DIAGNOSIS — T8131XS Disruption of external operation (surgical) wound, not elsewhere classified, sequela: Secondary | ICD-10-CM

## 2025-01-02 DIAGNOSIS — S91302A Unspecified open wound, left foot, initial encounter: Secondary | ICD-10-CM

## 2025-01-02 NOTE — Progress Notes (Signed)
 "  New Patient Office Visit  Subjective    Patient ID: [name], female    DOB: 09/05/1961  Age: 64 y.o. MRN: [id number]  CC:  Chief Complaint  Patient presents with   consult   HPI [name] presents to establish care  64 year old female with past medical history significant for obesity underwent flatfoot reconstruction with calcaneus osteotomy and FDL transfer about 20 months ago.  She has continued to have pain after surgery including painful hardware and talonavicular and subtalar joint arthritis.  She has failed nonoperative treatment and presented on 11/02/2024 for removal of the painful hardware and arthrodeses of the talonavicular and subtalar joints with Dr. Kit. She reports being a non-smoker. She was on anticoagulation for DVT prophylaxis (Xarelto  10 mg/daily) and completed the course. Body mass index is 48.13 kg/m. Patient referred to plastic surgery for left lateral malleolar wound s/p orthopedic surgery.     Outpatient Encounter Medications as of 01/02/2025  Medication Sig   cholecalciferol (VITAMIN D) 1000 UNITS tablet Take 1,000 Units by mouth daily.     diphenhydramine -acetaminophen  (TYLENOL  PM) 25-500 MG TABS tablet Take 1 tablet by mouth at bedtime as needed (sleep).   docusate sodium  (COLACE) 100 MG capsule Take 1 capsule (100 mg total) by mouth 2 (two) times daily. While taking narcotic pain medicine.   lisinopril -hydrochlorothiazide  (PRINZIDE ,ZESTORETIC ) 20-12.5 MG per tablet Take 1 tablet by mouth daily.     olmesartan (BENICAR) 5 MG tablet Take by mouth daily.   Omega-3 Fatty Acids (FISH OIL) 1200 MG CAPS Take 1,200 mg by mouth daily.   oxymetazoline (AFRIN) 0.05 % nasal spray Place 1 spray into both nostrils 2 (two) times daily as needed for congestion.   rivaroxaban  (XARELTO ) 10 MG TABS tablet Take 1 tablet (10 mg total) by mouth daily.   senna (SENOKOT) 8.6 MG TABS tablet Take 2 tablets (17.2 mg total) by mouth 2 (two) times daily.   No  facility-administered encounter medications on file as of 01/02/2025.    Past Medical History:  Diagnosis Date   Arthritis    Bronchitis    History of kidney stones    Hypertension    Obesity    Sickle cell trait     Past Surgical History:  Procedure Laterality Date   ABDOMINAL HYSTERECTOMY N/A 07/10/2014   Procedure: HYSTERECTOMY ABDOMINAL;  Surgeon: Norleen Edsel GAILS, MD;  Location: [facility];  Service: Gynecology;  Laterality: N/A;   BACK SURGERY     CALCANEAL OSTEOTOMY Left 04/08/2023   Procedure: Calcaneal Osteotomy;  Surgeon: Kit Norleen, MD;  Location: [facility];  Service: Orthopedics;  Laterality: Left;   CATARACT EXTRACTION W/PHACO Left 10/10/2018   Procedure: CATARACT EXTRACTION PHACO AND INTRAOCULAR LENS PLACEMENT (IOC);  Surgeon: Perley Hamilton, MD;  Location: [facility];  Service: Ophthalmology;  Laterality: Left;  CDE: 2.63   CATARACT EXTRACTION W/PHACO Right 10/27/2018   Procedure: CATARACT EXTRACTION PHACO AND INTRAOCULAR LENS PLACEMENT RIGHT EYE ;  Surgeon: Perley Hamilton, MD;  Location: [facility];  Service: Ophthalmology;  Laterality: Right;  right   COLONOSCOPY N/A 07/21/2013   Procedure: COLONOSCOPY;  Surgeon: Margo LITTIE Haddock, MD;  [facility];  Service: Endoscopy;  Laterality: N/A;  10:15 AM   CYST EXCISION Right    knee   CYSTOSCOPY W/ URETERAL STENT PLACEMENT Right 02/18/2022   Procedure: CYSTOSCOPY WITH RETROGRADE PYELOGRAM/URETERAL STENT PLACEMENT;  Surgeon: Alvaro Hummer, MD;  Location: [facility];  Service: Urology;  Laterality: Right;   FOOT ARTHRODESIS  Left 11/02/2024   Procedure: Subtalar joint and talonavicular joint arthrodesis;  Surgeon: Kit Rush, MD;  Location: [facility];  Service: Orthopedics;  Laterality: Left;   GASTROC RECESSION EXTREMITY Left 04/08/2023   Procedure: Gastroc recession;  Surgeon: Kit Rush, MD;  Location: [facility];  Service: Orthopedics;  Laterality: Left;   HARDWARE REMOVAL Left 11/02/2024    Procedure: Left foot removal of deep implants;  Surgeon: Kit Rush, MD;  Location: [facility];  Service: Orthopedics;  Laterality: Left;   HOLMIUM LASER APPLICATION Right 02/20/2022   Procedure: HOLMIUM LASER APPLICATION;  Surgeon: Alvaro Hummer, MD;  Location: [facility];  Service: Urology;  Laterality: Right;   NEPHROLITHOTOMY Right 02/18/2022   Procedure: FIRST STAGE NEPHROLITHOTOMY PERCUTANEOUS WITH SURGEON ACCESS;  Surgeon: Alvaro Hummer, MD;  Location: [facility];  Service: Urology;  Laterality: Right;  3 HRS   NEPHROLITHOTOMY Right 02/20/2022   Procedure: SECOND STAGENEPHROLITHOTOMY PERCUTANEOUS, NEPHROSTOMY TUBE CHANGE;  Surgeon: Alvaro Hummer, MD;  Location: [facility];  Service: Urology;  Laterality: Right;  3 HRS   NEPHROLITHOTOMY  02/23/2022   Procedure: NEPHROLITHOTOMY PERCUTANEOUS THIRD STAGE AND STENT PLACEMENT;  Surgeon: Alvaro Hummer, MD;  Location: [facility];  Service: Urology;;   POSTERIOR TIBIAL TENDON REPAIR Left 04/08/2023   Procedure: Left posterior tibialis debridement/tenolysis , left flexor digitorum longus tendon to navicular transfer;  Surgeon: Kit Rush, MD;  Location: [facility];  Service: Orthopedics;  Laterality: Left;   SALPINGOOPHORECTOMY Bilateral 07/10/2014   Procedure: SALPINGO OOPHORECTOMY;  Surgeon: Rush Edsel GAILS, MD;  Location: [facility];  Service: Gynecology;  Laterality: Bilateral;   TUBAL LIGATION     WRIST SURGERY      Family History  Problem Relation Age of Onset   Hypertension Mother    Sickle cell trait Mother    Heart attack Father    Sickle cell anemia Sister    Sickle cell trait Sister     Social History   Socioeconomic History   Marital status: Married    Spouse name: Not on file   Number of children: Not on file   Years of education: Not on file   Highest education level: Not on file  Occupational History   Not on file  Tobacco Use   Smoking status: Never   Smokeless tobacco: Never  Vaping Use    Vaping status: Never Used  Substance and Sexual Activity   Alcohol use: No   Drug use: No   Sexual activity: Not Currently    Birth control/protection: Surgical  Other Topics Concern   Not on file  Social History Narrative   Not on file   Social Drivers of Health   Tobacco Use: Low Risk (01/02/2025)   Patient History    Smoking Tobacco Use: Never    Smokeless Tobacco Use: Never    Passive Exposure: Not on file  Financial Resource Strain: Not on file  Food Insecurity: Not on file  Transportation Needs: Not on file  Physical Activity: Not on file  Stress: Not on file  Social Connections: Not on file  Intimate Partner Violence: Not on file  Depression (EYV7-0): Not on file  Alcohol Screen: Not on file  Housing: Not on file  Utilities: Not on file  Health Literacy: Not on file    ROS ROS negative except as noted in HPI     Objective    BP (!) 145/98 (BP Location: Right Wrist, Patient Position: Sitting, Cuff Size: Small)   Pulse 71  Ht 5' 4 (1.626 m)   Wt 280 lb 6.4 oz (127.2 kg)   LMP 06/19/2014   SpO2 99%   BMI 48.13 kg/m   Physical Exam Alert and oriented x 3 Hemodynamically normal Breathing without difficulty or effort Bilateral lower extremities well-perfused. Left lateral malleolar wound measuring 4 cm x 3 cm x 1 cm with multiple granulomas and necrotic tissue.    Last CBC Lab Results  Component Value Date   WBC 10.6 (H) 11/04/2024   HGB 12.4 11/04/2024   HCT 38.7 11/04/2024   MCV 83.8 11/04/2024   MCH 26.8 11/04/2024   RDW 13.5 11/04/2024   PLT 216 11/04/2024   Last metabolic panel Lab Results  Component Value Date   GLUCOSE 97 11/04/2024   NA 142 11/04/2024   K 3.6 11/04/2024   CL 105 11/04/2024   CO2 25 11/04/2024   BUN 8 11/04/2024   CREATININE 0.78 11/04/2024   GFRNONAA >60 11/04/2024   CALCIUM 9.5 11/04/2024   PROT 7.9 07/05/2014   ALBUMIN 3.6 07/05/2014   BILITOT 0.7 07/05/2014   ALKPHOS 66 07/05/2014   AST 17 07/05/2014    ALT 15 07/05/2014   ANIONGAP 13 11/04/2024   Procedure note - left lateral malleolar wound debridement Left lateral malleolar wound measuring 4 cm x 3 cm x 1 cm with multiple granulomas and necrotic tissue. I debrided the necrotic portions of the wound with sharp scissors. I used silver nitrate to burn granulomas, for hemostasis and promote wound healing.  At the end of the procedure the wound had the same dimensions.  Subsequently we soaked the wound with Vashe for 10 minutes with pat dry it and then apply UrgoClean Ag.  This was covered with an OpSite and the Ace wrap.     Assessment & Plan:   Problem List Items Addressed This Visit   None Visit Diagnoses       Open wound of left foot, initial encounter    -  Primary      Reconstructive options were discussed.  Among the options that we discussed we included skin substitute placement followed by skin graft, reversed pedicled sural flap, reversed soleus flap, and free gracilis flap. The patient understands the procedures, postop course, and potential complications.    For the skin substitute/skin graft reconstruction and flaps the usual postoperative recovery of 6-8 weeks and sometimes longer was discussed and the usual postoperative course was discussed. Risks discussed over and above the usual risks discussed included but were not limited to partial or complete skin substitute/skin graft/flap failure and the need for another type of reconstruction, chronic pain, scarring, skin breakdown, seroma (fluid build up); all sometimes needing further intervention including surgery. Hard areas (necrosis) can develop which are only dealt with if they are noticeable or painful. Asymmetries, irregularities and other shape concerns can arise and often do, that sometimes are amenable to revisions but sometimes are just a limitation of the outcome.    The general surgical risks discussed wound infections, bleeding, scarring, chronic pain, and wound  breakdown needing wound care, sometimes even a wound vac. Death can occur with any surgery. Sometimes a blood clot (DVT) can develop that may travel to the lungs (PE) and can be fatal. The patient voiced understanding and wishes to proceed with the plan. All questions and concerns were addressed to the patient's apparent satisfaction.   Needs PCP clearance.    We discussed local wound care as initial plan of care, clean with Vashe and apply  UrgoClean Ag every 3 days, followed by skin substitute and subsequently followed by skin graft, as patient wants to start with a less invasive option first.  We will submit the case for insurance approval, in the meantime we will continue local wound care. Follow up in 7-10 days with me.     Jahmia Berrett M Terrence Pizana, MD   "

## 2025-01-03 ENCOUNTER — Telehealth: Payer: Self-pay

## 2025-01-03 NOTE — Telephone Encounter (Signed)
 Faxed order form from [employer]  for:  Urgo Clean Mepilex bordering foam: 4x4 Ace Wrap  All dressings to be changed every 3 days

## 2025-01-03 NOTE — Telephone Encounter (Signed)
 Received fax confirmation: This message was sent via FAXCOM, a product from [employer]. [email or web address]/                    -------Fax Transmission Report-------  To:               Recipient at [phone] Subject:          SECURED Result:           The transmission was successful. Explanation:      All Pages Ok Pages Sent:       12 Connect Time:     12 minutes, 16 seconds Transmit Time:    01/03/2025 13:09 Transfer Rate:    14400 Status Code:      0000 Retry Count:      0 Job Id:           [id number] Unique Id:        [id number] Fax Line:         31 Fax Server:       [id number]

## 2025-01-23 ENCOUNTER — Telehealth: Payer: Self-pay

## 2025-01-23 NOTE — Telephone Encounter (Signed)
 Called pt and advised her to go to the [facility] to be seen for active bleeding x3 days.  She stated that called Dr. Patric office and has not heard anything back.  She stated that she would go to the [facility] office and see them in their UC facility.

## 2025-01-23 NOTE — Telephone Encounter (Signed)
 Pt stated that she is having constant bleeding from her open wound on her left foot, she is having to change the dressing multiple times a day.  No n/v/f.  The pad that the pt was given by the provider she no longer has.  Please advise

## 2025-01-25 ENCOUNTER — Observation Stay (HOSPITAL_COMMUNITY)
Admission: RE | Admit: 2025-01-25 | Discharge: 2025-01-30 | DRG: 857 | Disposition: A | Attending: Orthopedic Surgery | Admitting: Orthopedic Surgery

## 2025-01-25 ENCOUNTER — Other Ambulatory Visit: Payer: Self-pay

## 2025-01-25 ENCOUNTER — Ambulatory Visit (HOSPITAL_COMMUNITY): Admitting: Anesthesiology

## 2025-01-25 ENCOUNTER — Encounter (HOSPITAL_COMMUNITY): Payer: Self-pay | Admitting: Orthopedic Surgery

## 2025-01-25 ENCOUNTER — Encounter (HOSPITAL_COMMUNITY): Admission: RE | Disposition: A | Payer: Self-pay | Source: Home / Self Care | Attending: Orthopedic Surgery

## 2025-01-25 DIAGNOSIS — A491 Streptococcal infection, unspecified site: Secondary | ICD-10-CM

## 2025-01-25 DIAGNOSIS — M19079 Primary osteoarthritis, unspecified ankle and foot: Secondary | ICD-10-CM | POA: Diagnosis present

## 2025-01-25 DIAGNOSIS — I10 Essential (primary) hypertension: Secondary | ICD-10-CM

## 2025-01-25 DIAGNOSIS — S91302D Unspecified open wound, left foot, subsequent encounter: Secondary | ICD-10-CM

## 2025-01-25 DIAGNOSIS — L02612 Cutaneous abscess of left foot: Principal | ICD-10-CM | POA: Diagnosis present

## 2025-01-25 DIAGNOSIS — Z832 Family history of diseases of the blood and blood-forming organs and certain disorders involving the immune mechanism: Secondary | ICD-10-CM

## 2025-01-25 DIAGNOSIS — Z87442 Personal history of urinary calculi: Secondary | ICD-10-CM

## 2025-01-25 DIAGNOSIS — Z981 Arthrodesis status: Secondary | ICD-10-CM

## 2025-01-25 DIAGNOSIS — T8131XA Disruption of external operation (surgical) wound, not elsewhere classified, initial encounter: Secondary | ICD-10-CM | POA: Diagnosis present

## 2025-01-25 DIAGNOSIS — Z7982 Long term (current) use of aspirin: Secondary | ICD-10-CM

## 2025-01-25 DIAGNOSIS — T8142XA Infection following a procedure, deep incisional surgical site, initial encounter: Principal | ICD-10-CM | POA: Diagnosis present

## 2025-01-25 DIAGNOSIS — Z90710 Acquired absence of both cervix and uterus: Secondary | ICD-10-CM

## 2025-01-25 DIAGNOSIS — Z885 Allergy status to narcotic agent status: Secondary | ICD-10-CM

## 2025-01-25 DIAGNOSIS — B954 Other streptococcus as the cause of diseases classified elsewhere: Secondary | ICD-10-CM | POA: Diagnosis present

## 2025-01-25 DIAGNOSIS — Z79899 Other long term (current) drug therapy: Secondary | ICD-10-CM

## 2025-01-25 DIAGNOSIS — L03116 Cellulitis of left lower limb: Secondary | ICD-10-CM | POA: Diagnosis present

## 2025-01-25 DIAGNOSIS — D573 Sickle-cell trait: Secondary | ICD-10-CM | POA: Diagnosis present

## 2025-01-25 DIAGNOSIS — Y838 Other surgical procedures as the cause of abnormal reaction of the patient, or of later complication, without mention of misadventure at the time of the procedure: Secondary | ICD-10-CM | POA: Diagnosis present

## 2025-01-25 DIAGNOSIS — Z8249 Family history of ischemic heart disease and other diseases of the circulatory system: Secondary | ICD-10-CM

## 2025-01-25 DIAGNOSIS — Z6841 Body Mass Index (BMI) 40.0 and over, adult: Secondary | ICD-10-CM

## 2025-01-25 LAB — CBC
HCT: 38.5 % (ref 36.0–46.0)
Hemoglobin: 12.5 g/dL (ref 12.0–15.0)
MCH: 26.5 pg (ref 26.0–34.0)
MCHC: 32.5 g/dL (ref 30.0–36.0)
MCV: 81.6 fL (ref 80.0–100.0)
Platelets: 289 10*3/uL (ref 150–400)
RBC: 4.72 MIL/uL (ref 3.87–5.11)
RDW: 14.1 % (ref 11.5–15.5)
WBC: 9.7 10*3/uL (ref 4.0–10.5)
nRBC: 0 % (ref 0.0–0.2)

## 2025-01-25 LAB — BASIC METABOLIC PANEL WITH GFR
Anion gap: 12 (ref 5–15)
BUN: 12 mg/dL (ref 8–23)
CO2: 24 mmol/L (ref 22–32)
Calcium: 10.4 mg/dL — ABNORMAL HIGH (ref 8.9–10.3)
Chloride: 106 mmol/L (ref 98–111)
Creatinine, Ser: 0.96 mg/dL (ref 0.44–1.00)
GFR, Estimated: >60 mL/min
Glucose, Bld: 114 mg/dL — ABNORMAL HIGH (ref 70–99)
Potassium: 4.4 mmol/L (ref 3.5–5.1)
Sodium: 142 mmol/L (ref 135–145)

## 2025-01-25 LAB — LACTIC ACID, PLASMA: Lactic Acid, Venous: 1.5 mmol/L (ref 0.5–1.9)

## 2025-01-25 MED ORDER — ONDANSETRON HCL 4 MG/2ML IJ SOLN: 4.0000 mg | Freq: Four times a day (QID) | INTRAMUSCULAR | Status: DC | PRN

## 2025-01-25 MED ORDER — OXYCODONE HCL 5 MG/5ML PO SOLN: 5.0000 mg | Freq: Once | ORAL | Status: DC | PRN

## 2025-01-25 MED ORDER — LACTATED RINGERS IV SOLN: INTRAVENOUS | Status: DC

## 2025-01-25 MED ORDER — IRBESARTAN 150 MG PO TABS
150.0000 mg | ORAL_TABLET | Freq: Every day | ORAL | Status: DC
  Administered 2025-01-26: 150 mg via ORAL
  Filled 2025-01-25: qty 1

## 2025-01-25 MED ORDER — VANCOMYCIN HCL 500 MG IV SOLR: INTRAVENOUS | Status: DC | PRN

## 2025-01-25 MED ORDER — POLYETHYLENE GLYCOL 3350 17 G PO PACK: 17.0000 g | PACK | Freq: Every day | ORAL | Status: DC | PRN

## 2025-01-25 MED ORDER — VANCOMYCIN HCL 1500 MG/300ML IV SOLN: 1500.0000 mg | INTRAVENOUS | Status: DC

## 2025-01-25 MED ORDER — LIDOCAINE HCL (CARDIAC) PF 100 MG/5ML IV SOSY
PREFILLED_SYRINGE | INTRAVENOUS | Status: DC | PRN
  Administered 2025-01-25: 60 mg via INTRAVENOUS

## 2025-01-25 MED ORDER — VANCOMYCIN HCL 1000 MG IV SOLR
INTRAVENOUS | Status: AC
  Filled 2025-01-25: qty 20

## 2025-01-25 MED ORDER — PROPOFOL 10 MG/ML IV BOLUS
INTRAVENOUS | Status: AC
  Filled 2025-01-25: qty 20

## 2025-01-25 MED ORDER — VANCOMYCIN HCL 2000 MG/400ML IV SOLN
2000.0000 mg | Freq: Once | INTRAVENOUS | Status: AC
  Administered 2025-01-25: 2000 mg via INTRAVENOUS
  Filled 2025-01-25: qty 400

## 2025-01-25 MED ORDER — FENTANYL CITRATE (PF) 100 MCG/2ML IJ SOLN
INTRAMUSCULAR | Status: AC
  Filled 2025-01-25: qty 2

## 2025-01-25 MED ORDER — SODIUM CHLORIDE 0.9 % IV SOLN: INTRAVENOUS | Status: DC

## 2025-01-25 MED ORDER — HYDROMORPHONE HCL 1 MG/ML IJ SOLN
INTRAMUSCULAR | Status: AC
  Filled 2025-01-25: qty 1

## 2025-01-25 MED ORDER — MORPHINE SULFATE (PF) 2 MG/ML IV SOLN: 0.5000 mg | INTRAVENOUS | Status: DC | PRN

## 2025-01-25 MED ORDER — ONDANSETRON HCL 4 MG/2ML IJ SOLN
INTRAMUSCULAR | Status: AC
  Filled 2025-01-25: qty 2

## 2025-01-25 MED ORDER — HYDROMORPHONE HCL 1 MG/ML IJ SOLN
0.2500 mg | INTRAMUSCULAR | Status: DC | PRN
  Administered 2025-01-25 (×2): 0.5 mg via INTRAVENOUS

## 2025-01-25 MED ORDER — HYDROCODONE-ACETAMINOPHEN 7.5-325 MG PO TABS
1.0000 | ORAL_TABLET | ORAL | Status: DC | PRN
  Administered 2025-01-26: 2 via ORAL
  Administered 2025-01-26 (×3): 1 via ORAL
  Administered 2025-01-27 – 2025-01-28 (×3): 2 via ORAL
  Administered 2025-01-29: 1 via ORAL
  Filled 2025-01-25: qty 1
  Filled 2025-01-25 (×3): qty 2
  Filled 2025-01-25 (×2): qty 1
  Filled 2025-01-25: qty 2
  Filled 2025-01-25: qty 1

## 2025-01-25 MED ORDER — ACETAMINOPHEN 500 MG PO TABS
1000.0000 mg | ORAL_TABLET | Freq: Once | ORAL | Status: AC
  Administered 2025-01-25: 1000 mg via ORAL
  Filled 2025-01-25: qty 2

## 2025-01-25 MED ORDER — DEXAMETHASONE SOD PHOSPHATE PF 10 MG/ML IJ SOLN
INTRAMUSCULAR | Status: AC
  Filled 2025-01-25: qty 1

## 2025-01-25 MED ORDER — MIDAZOLAM HCL 5 MG/5ML IJ SOLN
INTRAMUSCULAR | Status: DC | PRN
  Administered 2025-01-25: 2 mg via INTRAVENOUS

## 2025-01-25 MED ORDER — ACETAMINOPHEN 325 MG PO TABS: 325.0000 mg | ORAL_TABLET | Freq: Four times a day (QID) | ORAL | Status: DC | PRN

## 2025-01-25 MED ORDER — FENTANYL CITRATE (PF) 100 MCG/2ML IJ SOLN
INTRAMUSCULAR | Status: DC | PRN
  Administered 2025-01-25: 50 ug via INTRAVENOUS
  Administered 2025-01-25 (×4): 25 ug via INTRAVENOUS

## 2025-01-25 MED ORDER — LABETALOL HCL 5 MG/ML IV SOLN
INTRAVENOUS | Status: DC | PRN
  Administered 2025-01-25: 10 mg via INTRAVENOUS

## 2025-01-25 MED ORDER — MEPERIDINE HCL 25 MG/ML IJ SOLN: 6.2500 mg | INTRAMUSCULAR | Status: DC | PRN

## 2025-01-25 MED ORDER — MAGNESIUM CITRATE PO SOLN: 1.0000 | Freq: Once | ORAL | Status: DC | PRN

## 2025-01-25 MED ORDER — ONDANSETRON HCL 4 MG PO TABS: 4.0000 mg | ORAL_TABLET | Freq: Four times a day (QID) | ORAL | Status: DC | PRN

## 2025-01-25 MED ORDER — ENOXAPARIN SODIUM 30 MG/0.3ML IJ SOSY
30.0000 mg | PREFILLED_SYRINGE | INTRAMUSCULAR | Status: DC
  Administered 2025-01-26 – 2025-01-27 (×2): 30 mg via SUBCUTANEOUS
  Filled 2025-01-25 (×2): qty 0.3

## 2025-01-25 MED ORDER — PROPOFOL 10 MG/ML IV BOLUS
INTRAVENOUS | Status: DC | PRN
  Administered 2025-01-25: 300 mg via INTRAVENOUS

## 2025-01-25 MED ORDER — METOCLOPRAMIDE HCL 5 MG/ML IJ SOLN: 5.0000 mg | Freq: Three times a day (TID) | INTRAMUSCULAR | Status: DC | PRN

## 2025-01-25 MED ORDER — MIDAZOLAM HCL (PF) 2 MG/2ML IJ SOLN: 0.5000 mg | Freq: Once | INTRAMUSCULAR | Status: DC | PRN

## 2025-01-25 MED ORDER — DEXAMETHASONE SOD PHOSPHATE PF 10 MG/ML IJ SOLN
INTRAMUSCULAR | Status: DC | PRN
  Administered 2025-01-25: 10 mg via INTRAVENOUS

## 2025-01-25 MED ORDER — MIDAZOLAM HCL 2 MG/2ML IJ SOLN
INTRAMUSCULAR | Status: AC
  Filled 2025-01-25: qty 2

## 2025-01-25 MED ORDER — OLMESARTAN MEDOXOMIL-HCTZ 20-12.5 MG PO TABS: 1.0000 | ORAL_TABLET | Freq: Every day | ORAL | Status: DC

## 2025-01-25 MED ORDER — LIDOCAINE HCL (PF) 2 % IJ SOLN
INTRAMUSCULAR | Status: AC
  Filled 2025-01-25: qty 5

## 2025-01-25 MED ORDER — HYDROCHLOROTHIAZIDE 12.5 MG PO TABS
12.5000 mg | ORAL_TABLET | Freq: Every day | ORAL | Status: DC
  Administered 2025-01-26 – 2025-01-30 (×5): 12.5 mg via ORAL
  Filled 2025-01-25 (×5): qty 1

## 2025-01-25 MED ORDER — VANCOMYCIN HCL IN DEXTROSE 1-5 GM/200ML-% IV SOLN: 1000.0000 mg | Freq: Once | INTRAVENOUS | Status: DC

## 2025-01-25 MED ORDER — CEFAZOLIN SODIUM-DEXTROSE 2-4 GM/100ML-% IV SOLN
INTRAVENOUS | Status: AC
  Filled 2025-01-25: qty 100

## 2025-01-25 MED ORDER — SENNA 8.6 MG PO TABS
1.0000 | ORAL_TABLET | Freq: Every day | ORAL | Status: DC
  Administered 2025-01-25 – 2025-01-30 (×6): 8.6 mg via ORAL
  Filled 2025-01-25 (×6): qty 1

## 2025-01-25 MED ORDER — METOCLOPRAMIDE HCL 5 MG PO TABS: 5.0000 mg | ORAL_TABLET | Freq: Three times a day (TID) | ORAL | Status: DC | PRN

## 2025-01-25 MED ORDER — OXYCODONE HCL 5 MG PO TABS: 5.0000 mg | ORAL_TABLET | Freq: Once | ORAL | Status: DC | PRN

## 2025-01-25 MED ORDER — SORBITOL 70 % SOLN: 30.0000 mL | Freq: Every day | Status: DC | PRN

## 2025-01-25 MED ORDER — ONDANSETRON HCL 4 MG/2ML IJ SOLN
INTRAMUSCULAR | Status: DC | PRN
  Administered 2025-01-25: 4 mg via INTRAVENOUS

## 2025-01-25 MED ORDER — DEXTROSE 5 % IV SOLN
INTRAVENOUS | Status: DC | PRN
  Administered 2025-01-25: 3 g via INTRAVENOUS

## 2025-01-25 MED ORDER — TRAMADOL HCL 50 MG PO TABS
50.0000 mg | ORAL_TABLET | Freq: Four times a day (QID) | ORAL | Status: DC | PRN
  Administered 2025-01-25 – 2025-01-29 (×6): 50 mg via ORAL
  Filled 2025-01-25 (×6): qty 1

## 2025-01-25 NOTE — Anesthesia Postprocedure Evaluation (Signed)
"   Anesthesia Post Note  Patient: [name]  Procedure(s) Performed: IRRIGATION AND DEBRIDEMENT WOUND (Left: Ankle)     Patient location during evaluation: PACU Anesthesia Type: General Level of consciousness: awake and alert Pain management: pain level controlled Vital Signs Assessment: post-procedure vital signs reviewed and stable Respiratory status: spontaneous breathing, nonlabored ventilation, respiratory function stable and patient connected to nasal cannula oxygen Cardiovascular status: blood pressure returned to baseline and stable Postop Assessment: no apparent nausea or vomiting Anesthetic complications: no   No notable events documented.  Last Vitals:  Vitals:   01/25/25 1806 01/25/25 1815  BP: (!) 158/85 (!) 159/81  Pulse: (!) 54 (!) 55  Resp: 12 12  Temp:    SpO2: 95% 95%    Last Pain:  Vitals:   01/25/25 1815  TempSrc:   PainSc: 4                  [name]      "

## 2025-01-25 NOTE — Transfer of Care (Signed)
 Immediate Anesthesia Transfer of Care Note  Patient: [name]  Procedure(s) Performed: IRRIGATION AND DEBRIDEMENT WOUND (Left: Ankle)  Patient Location: PACU  Anesthesia Type:General  Level of Consciousness: awake, alert , and oriented  Airway & Oxygen Therapy: Patient Spontanous Breathing  Post-op Assessment: Report given to RN and Post -op Vital signs reviewed and stable  Post vital signs: Reviewed and stable  Last Vitals:  Vitals Value Taken Time  BP 197/108 01/25/25 17:49  Temp 36.5 C 01/25/25 17:45  Pulse 62 01/25/25 17:53  Resp 17 01/25/25 17:54  SpO2 100 % 01/25/25 17:53  Vitals shown include unfiled device data.  Last Pain:  Vitals:   01/25/25 1745  TempSrc:   PainSc: 0-No pain         Complications: No notable events documented.

## 2025-01-25 NOTE — H&P (Signed)
 [name] is an 64 y.o. female.   Chief Complaint: Left foot pain  HPI: 64 year old woman with past medical history significant for obesity is now almost 3 months out from subtalar and talonavicular joint arthrodesis.  Her postoperative course has been complicated by a wound hematoma at the sinus Tarsi incision.  She notices increased swelling, erythema and drainage from the wound over the last several days.  Physical exam findings are concerning for abscess.  She also has signs and symptoms of cellulitis.  She is not currently on any antibiotics.  She presents now for irrigation and debridement of the wound for deep cultures.  Past Medical History:  Diagnosis Date   Arthritis    Bronchitis    History of kidney stones    Hypertension    Obesity    Sickle cell trait     Past Surgical History:  Procedure Laterality Date   ABDOMINAL HYSTERECTOMY N/A 07/10/2014   Procedure: HYSTERECTOMY ABDOMINAL;  Surgeon: Norleen Edsel GAILS, MD;  Location: [facility];  Service: Gynecology;  Laterality: N/A;   BACK SURGERY     CALCANEAL OSTEOTOMY Left 04/08/2023   Procedure: Calcaneal Osteotomy;  Surgeon: Kit Norleen, MD;  Location: [facility];  Service: Orthopedics;  Laterality: Left;   CATARACT EXTRACTION W/PHACO Left 10/10/2018   Procedure: CATARACT EXTRACTION PHACO AND INTRAOCULAR LENS PLACEMENT (IOC);  Surgeon: Perley Hamilton, MD;  Location: [facility];  Service: Ophthalmology;  Laterality: Left;  CDE: 2.63   CATARACT EXTRACTION W/PHACO Right 10/27/2018   Procedure: CATARACT EXTRACTION PHACO AND INTRAOCULAR LENS PLACEMENT RIGHT EYE ;  Surgeon: Perley Hamilton, MD;  Location: [facility];  Service: Ophthalmology;  Laterality: Right;  right   COLONOSCOPY N/A 07/21/2013   Procedure: COLONOSCOPY;  Surgeon: Margo LITTIE Haddock, MD;  [facility];  Service: Endoscopy;  Laterality: N/A;  10:15 AM   CYST EXCISION Right    knee   CYSTOSCOPY W/ URETERAL STENT PLACEMENT Right 02/18/2022   Procedure:  CYSTOSCOPY WITH RETROGRADE PYELOGRAM/URETERAL STENT PLACEMENT;  Surgeon: Alvaro Hummer, MD;  Location: [facility];  Service: Urology;  Laterality: Right;   FOOT ARTHRODESIS Left 11/02/2024   Procedure: Subtalar joint and talonavicular joint arthrodesis;  Surgeon: Kit Norleen, MD;  Location: [facility];  Service: Orthopedics;  Laterality: Left;   GASTROC RECESSION EXTREMITY Left 04/08/2023   Procedure: Gastroc recession;  Surgeon: Kit Norleen, MD;  Location: [facility];  Service: Orthopedics;  Laterality: Left;   HARDWARE REMOVAL Left 11/02/2024   Procedure: Left foot removal of deep implants;  Surgeon: Kit Norleen, MD;  Location: [facility];  Service: Orthopedics;  Laterality: Left;   HOLMIUM LASER APPLICATION Right 02/20/2022   Procedure: HOLMIUM LASER APPLICATION;  Surgeon: Alvaro Hummer, MD;  Location: [facility];  Service: Urology;  Laterality: Right;   NEPHROLITHOTOMY Right 02/18/2022   Procedure: FIRST STAGE NEPHROLITHOTOMY PERCUTANEOUS WITH SURGEON ACCESS;  Surgeon: Alvaro Hummer, MD;  Location: [facility];  Service: Urology;  Laterality: Right;  3 HRS   NEPHROLITHOTOMY Right 02/20/2022   Procedure: SECOND STAGENEPHROLITHOTOMY PERCUTANEOUS, NEPHROSTOMY TUBE CHANGE;  Surgeon: Alvaro Hummer, MD;  Location: [facility];  Service: Urology;  Laterality: Right;  3 HRS   NEPHROLITHOTOMY  02/23/2022   Procedure: NEPHROLITHOTOMY PERCUTANEOUS THIRD STAGE AND STENT PLACEMENT;  Surgeon: Alvaro Hummer, MD;  Location: [facility];  Service: Urology;;   POSTERIOR TIBIAL TENDON REPAIR Left 04/08/2023   Procedure: Left posterior tibialis debridement/tenolysis , left flexor digitorum longus tendon to navicular transfer;  Surgeon:  Kit Rush, MD;  Location: [facility];  Service: Orthopedics;  Laterality: Left;   SALPINGOOPHORECTOMY Bilateral 07/10/2014   Procedure: SALPINGO OOPHORECTOMY;  Surgeon: Rush Edsel GAILS, MD;  Location: [facility];  Service: Gynecology;   Laterality: Bilateral;   TUBAL LIGATION     WRIST SURGERY      Family History  Problem Relation Age of Onset   Hypertension Mother    Sickle cell trait Mother    Heart attack Father    Sickle cell anemia Sister    Sickle cell trait Sister    Social History:  reports that she has never smoked. She has never used smokeless tobacco. She reports that she does not drink alcohol and does not use drugs.  Allergies: Allergies[1]  No medications prior to admission.    No results found for this or any previous visit (from the past 48 hours). No results found.  Review of Systems erythema, warmth, tenderness and drainage at the left foot.  No fever chills nausea or vomiting.  10 system review is otherwise negative  Last menstrual period 06/19/2014. Physical Exam  Well-nourished well-developed woman in no apparent distress.  Alert and oriented.  Normal mood and affect.  Gait is heel-to-toe reciprocal in a short cam boot with some antalgia to the left.  The left foot has a wound laterally that is approximately 3 cm x 2 cm with abundant granulation tissue.  There is seropurulent drainage from the wound.  There are some erythema surrounding the wound to the lateral ankle and hindfoot.   Assessment/Plan Left foot cellulitis and postoperative wound infection -to the operating room today for irrigation and excisional debridement with deep cultures.  The risks and benefits of the alternative treatment options have been discussed in detail.  The patient wishes to proceed with surgery and specifically understands risks of bleeding, infection, nerve damage, blood clots, need for additional surgery, amputation and death.   Rush Kit, MD January 26, 2025, 11:02 AM       [1]  Allergies Allergen Reactions   Oxycodone  Shortness Of Breath

## 2025-01-25 NOTE — Op Note (Signed)
 01/25/2025  5:35 PM  PATIENT:  [name]  64 y.o. female  PRE-OPERATIVE DIAGNOSIS:  left foot deep abscess  POST-OPERATIVE DIAGNOSIS:  same  Procedures: Irrigation and excisional debridement of left foot deep abscess including multiple areas  SURGEON:  Norleen Armor, MD  ASSISTANT: None  ANESTHESIA:   General  EBL:  minimal   TOURNIQUET: Approximately 15 minutes with an ankle Esmarch  COMPLICATIONS:  None apparent  DISPOSITION:  Extubated, awake and stable to recovery.  INDICATION FOR PROCEDURE: 64 year old woman now almost 3 months out from left talonavicular and subtalar joint arthrodeses.  Her postoperative course was complicated by lateral hindfoot wound dehiscence.  She has been making good progress at healing the lateral hindfoot wound until a few days ago when she began noticing swelling, erythema and drainage from the wound.  On physical exam she has an area of fluctuance just anterior to the wound site.  She presents now for irrigation and debridement of the wound and likely abscess.  The risks and benefits of the alternative treatment options have been discussed in detail.  The patient wishes to proceed with surgery and specifically understands risks of bleeding, infection, nerve damage, blood clots, need for additional surgery, amputation and death.   PROCEDURE IN DETAIL:  After pre operative consent was obtained, and the correct operative site was identified, the patient was brought to the operating room and placed supine on the OR table.  Anesthesia was administered.  Pre-operative antibiotics were held pending cultures.  A surgical timeout was taken.  The left lower extremity was prepped and draped in standard sterile fashion.  The foot was elevated, and a 4 inch Esmarch tourniquet was wrapped around the ankle.  The lateral wound measured approximately 3 cm x 2 cm and was no more than 1 cm deep.  The most anterior aspect of the wound was probed with a curette at the  area of fluctuance.  A deep abscess was entered with immediate drainage of bloody purulent fluid.  Fluid and soft tissue was gathered as a specimen and sent to microbiology for aerobic and anaerobic cultures.  IV Ancef  was then administered.  The wound was extended anteriorly about a centimeter.  This unroofed the abscess.  The area of abscess was then debrided with curette and periosteal elevator removing all purulence and devitalized tissue.  The wound was then irrigated with approximately 2 L of normal saline.  Debridement was then repeated from the level of the skin down through the subcutaneous tissues to the level of the muscle below.  Again devitalized tissue was removed.  The wound was again irrigated with a liter of normal saline.  The wound was then packed with iodoform gauze.  The tourniquet was released.  Sterile dressings were applied followed by a compression wrap.  The patient was awakened from anesthesia and transported to the recovery room in stable condition.   FOLLOW UP PLAN: The patient will be admitted for IV antibiotics.  We will start vancomycin  and Rocephin.  She will be weightbearing as tolerated in a cam boot.  Hospitalist consultation for management of her medical comorbidities.   Debridement type: Excisional Debridement  Side: left  Body Location: foot   Tools used for debridement: scalpel and curette  Pre-debridement Wound size (cm):   Length: 3        Width: 2     Depth: 1   Post-debridement Wound size (cm):   Length: 4        Width: 2  Depth: 5   Debridement depth beyond dead/damaged tissue down to healthy viable tissue: yes  Tissue layer involved: skin, subcutaneous tissue, muscle / fascia  Nature of tissue removed: Non-viable tissue and Purulence  Irrigation volume: 3 L     Irrigation fluid type: Normal Saline

## 2025-01-25 NOTE — Consult Note (Signed)
 "  [facility] Initial Consultation Note  [name] FMW:[id number] DOB: 06-12-1961 DOA: 01/25/2025  PCP: Marvine Rush, MD   Requesting Physician: Dr. Kit  Reason for Consultation: Medical Management  HPI: [name] is a 64 y.o. female with medical history significant for sickle cell trait, obesity, hypertension being admitted to the hospital by the orthopedic service for washout and debridement of suspected left foot abscess.  She had flatfoot reconstruction nearly 18 months ago, after that she had surgical removal of deep hardware as well as subtalar and talonavicular joint arthrodesis with Dr. Kit in November.  Since that time, she has had a nonhealing left lateral surgical wound and now there is concern for abscess.  She has some mild surrounding cellulitis, but has not been on any antibiotics.  Hospitalist consult for medical management is requested.   Review of Systems: Please see HPI for pertinent positives and negatives. A complete 10 system review of systems are otherwise negative.  Past Medical History:  Diagnosis Date   Arthritis    Bronchitis    History of kidney stones    Hypertension    Obesity    Sickle cell trait    Past Surgical History:  Procedure Laterality Date   ABDOMINAL HYSTERECTOMY N/A 07/10/2014   Procedure: HYSTERECTOMY ABDOMINAL;  Surgeon: Rush Edsel GAILS, MD;  Location: [facility];  Service: Gynecology;  Laterality: N/A;   BACK SURGERY     CALCANEAL OSTEOTOMY Left 04/08/2023   Procedure: Calcaneal Osteotomy;  Surgeon: Kit Rush, MD;  Location: [facility];  Service: Orthopedics;  Laterality: Left;   CATARACT EXTRACTION W/PHACO Left 10/10/2018   Procedure: CATARACT EXTRACTION PHACO AND INTRAOCULAR LENS PLACEMENT (IOC);  Surgeon: Perley Hamilton, MD;  Location: [facility];  Service: Ophthalmology;  Laterality: Left;  CDE: 2.63   CATARACT EXTRACTION W/PHACO Right 10/27/2018   Procedure: CATARACT EXTRACTION PHACO AND  INTRAOCULAR LENS PLACEMENT RIGHT EYE ;  Surgeon: Perley Hamilton, MD;  Location: [facility];  Service: Ophthalmology;  Laterality: Right;  right   COLONOSCOPY N/A 07/21/2013   Procedure: COLONOSCOPY;  Surgeon: Margo LITTIE Haddock, MD;  Location: [facility];  Service: Endoscopy;  Laterality: N/A;  10:15 AM   CYST EXCISION Right    knee   CYSTOSCOPY W/ URETERAL STENT PLACEMENT Right 02/18/2022   Procedure: CYSTOSCOPY WITH RETROGRADE PYELOGRAM/URETERAL STENT PLACEMENT;  Surgeon: Alvaro Hummer, MD;  Location: [facility];  Service: Urology;  Laterality: Right;   FOOT ARTHRODESIS Left 11/02/2024   Procedure: Subtalar joint and talonavicular joint arthrodesis;  Surgeon: Kit Rush, MD;  Location: [facility];  Service: Orthopedics;  Laterality: Left;   GASTROC RECESSION EXTREMITY Left 04/08/2023   Procedure: Gastroc recession;  Surgeon: Kit Rush, MD;  Location: [facility];  Service: Orthopedics;  Laterality: Left;   HARDWARE REMOVAL Left 11/02/2024   Procedure: Left foot removal of deep implants;  Surgeon: Kit Rush, MD;  Location: [facility];  Service: Orthopedics;  Laterality: Left;   HOLMIUM LASER APPLICATION Right 02/20/2022   Procedure: HOLMIUM LASER APPLICATION;  Surgeon: Alvaro Hummer, MD;  Location: [facility];  Service: Urology;  Laterality: Right;   NEPHROLITHOTOMY Right 02/18/2022   Procedure: FIRST STAGE NEPHROLITHOTOMY PERCUTANEOUS WITH SURGEON ACCESS;  Surgeon: Alvaro Hummer, MD;  Location: [facility];  Service: Urology;  Laterality: Right;  3 HRS   NEPHROLITHOTOMY Right 02/20/2022   Procedure: SECOND STAGENEPHROLITHOTOMY PERCUTANEOUS, NEPHROSTOMY TUBE CHANGE;  Surgeon: Alvaro Hummer, MD;  Location: [facility];  Service: Urology;  Laterality:  Right;  3 HRS   NEPHROLITHOTOMY  02/23/2022   Procedure: NEPHROLITHOTOMY PERCUTANEOUS THIRD STAGE AND STENT PLACEMENT;  Surgeon: Alvaro Hummer, MD;  Location: [facility];  Service: Urology;;   POSTERIOR TIBIAL TENDON REPAIR  Left 04/08/2023   Procedure: Left posterior tibialis debridement/tenolysis , left flexor digitorum longus tendon to navicular transfer;  Surgeon: Kit Rush, MD;  Location: [facility];  Service: Orthopedics;  Laterality: Left;   SALPINGOOPHORECTOMY Bilateral 07/10/2014   Procedure: SALPINGO OOPHORECTOMY;  Surgeon: Rush Edsel GAILS, MD;  Location: [facility];  Service: Gynecology;  Laterality: Bilateral;   TUBAL LIGATION     WRIST SURGERY      Social History:  reports that she has never smoked. She has never used smokeless tobacco. She reports that she does not drink alcohol and does not use drugs.  Allergies[1]  Family History  Problem Relation Age of Onset   Hypertension Mother    Sickle cell trait Mother    Heart attack Father    Sickle cell anemia Sister    Sickle cell trait Sister      Prior to Admission medications  Medication Sig Start Date End Date Taking? Authorizing Provider  aspirin 81 MG chewable tablet Chew 81 mg by mouth daily.   Yes [provider]  cholecalciferol (VITAMIN D) 1000 UNITS tablet Take 1,000 Units by mouth daily.     Yes [provider]  olmesartan -hydrochlorothiazide  (BENICAR  HCT) 20-12.5 MG tablet Take 1 tablet by mouth daily.   Yes [provider]  Omega-3 Fatty Acids (FISH OIL PO) Take 1 capsule by mouth daily.   Yes [provider]  oxymetazoline (AFRIN) 0.05 % nasal spray Place 1 spray into both nostrils 2 (two) times daily as needed for congestion.   Yes [provider]  docusate sodium  (COLACE) 100 MG capsule Take 1 capsule (100 mg total) by mouth 2 (two) times daily. While taking narcotic pain medicine. Patient not taking: Reported on 01/24/2025 11/02/24   Aniceto Renee Grebe, PA-C    Physical Exam: BP (!) 154/90   Pulse 99   Temp 98.2 F (36.8 C) (Oral)   Resp 18   Ht 5' 4 (1.626 m)   Wt 127 kg   LMP 06/19/2014   SpO2 98%   BMI 48.06 kg/m   General:  Alert, oriented, calm, in no  acute distress, her daughters are at the bedside Cardiovascular: RRR, no murmurs or rubs, no peripheral edema  Respiratory: clear to auscultation bilaterally, no wheezes, no crackles  Abdomen: soft, nontender, nondistended Musculoskeletal: Left foot is wrapped and not examined by this writer          Recent Labs and Imaging Reviewed:  Basic Metabolic Panel: No results for input(s): NA, K, CL, CO2, GLUCOSE, BUN, CREATININE, CALCIUM, MG, PHOS in the last 168 hours. Liver Function Tests: No results for input(s): AST, ALT, ALKPHOS, BILITOT, PROT, ALBUMIN in the last 168 hours. No results for input(s): LIPASE, AMYLASE in the last 168 hours. No results for input(s): AMMONIA in the last 168 hours. CBC: No results for input(s): WBC, NEUTROABS, HGB, HCT, MCV, PLT in the last 168 hours. Cardiac Enzymes: No results for input(s): CKTOTAL, CKMB, CKMBINDEX, TROPONINI in the last 168 hours.  BNP (last 3 results) No results for input(s): BNP in the last 8760 hours.  ProBNP (last 3 results) No results for input(s): PROBNP in the last 8760 hours.  CBG: No results for input(s): GLUCAP in the last 168 hours.  Radiological Exams on Admission:  No results found.  Summary and Recommendations: [name] is a 64 y.o. female with medical history significant for sickle cell trait, obesity, hypertension being admitted to the hospital by the orthopedic service for washout and debridement of suspected left foot abscess.  She is being seen in consultation for management of her medical issues.  Left foot cellulitis and suspected abscess-patient is hemodynamically stable and appears nontoxic.   -Surgical management per primary orthopedic team, who will be admitting her to the hospital after surgery this evening -CBC, BMP, lactate now -Dr. Kit plans to order empiric IV antibiotics postoperatively  Hypertension-continue home  Benicar   Thank you for involving us  in the care of your patient. [facility] will continue to follow along with you.  Time spent: 45 minutes  Krisann Mckenna CHRISTELLA Gail MD [facility] Pager [phone]  If 7PM-7AM, please contact night-coverage [email or web address] Password [email or web address]  01/25/2025, 2:42 PM      [1]  Allergies Allergen Reactions   Oxycodone  Shortness Of Breath   "

## 2025-01-25 NOTE — Anesthesia Preprocedure Evaluation (Addendum)
"                                    Anesthesia Evaluation  Patient identified by MRN, date of birth, ID band Patient awake    Reviewed: Allergy & Precautions, NPO status , Patient's Chart, lab work & pertinent test results  History of Anesthesia Complications Negative for: history of anesthetic complications  Airway Mallampati: I  TM Distance: >3 FB Neck ROM: Full    Dental  (+) Dental Advisory Given   Pulmonary neg pulmonary ROS   breath sounds clear to auscultation       Cardiovascular hypertension, Pt. on medications (-) angina  Rhythm:Regular Rate:Normal     Neuro/Psych negative neurological ROS     GI/Hepatic negative GI ROS, Neg liver ROS,,,  Endo/Other    Class 4 obesityBMI 46.5  Renal/GU stones     Musculoskeletal  (+) Arthritis ,    Abdominal   Peds  Hematology  (+) Blood dyscrasia, Sickle cell trait   Anesthesia Other Findings   Reproductive/Obstetrics                              Anesthesia Physical Anesthesia Plan  ASA: 3  Anesthesia Plan: General   Post-op Pain Management: Tylenol  PO (pre-op)*   Induction: Intravenous  PONV Risk Score and Plan: 3 and Dexamethasone , Ondansetron  and Scopolamine patch - Pre-op  Airway Management Planned: LMA  Additional Equipment: None  Intra-op Plan:   Post-operative Plan:   Informed Consent: I have reviewed the patients History and Physical, chart, labs and discussed the procedure including the risks, benefits and alternatives for the proposed anesthesia with the patient or authorized representative who has indicated his/her understanding and acceptance.     Dental advisory given  Plan Discussed with: CRNA and Surgeon  Anesthesia Plan Comments:          Anesthesia Quick Evaluation  "

## 2025-01-25 NOTE — Progress Notes (Signed)
 Pharmacy Antibiotic Note  [name] is a 64 y.o. female admitted on 01/25/2025 with I&D of left foot abscess.  Pharmacy has been consulted for vancomycin  dosing.  Today, 01/25/25 WBC WNL, afebrile SCr WNL  Plan: Vancomycin  2000 mg loading dose followed by 1500 mg IV q24h for estimated AUC of 511 (using Vd 0.5) Goal AUC 400-550, check levels as needed Monitor renal function  Height: 5' 4 (162.6 cm) Weight: 127.8 kg (281 lb 12 oz) IBW/kg (Calculated) : 54.7  Temp (24hrs), Avg:97.9 F (36.6 C), Min:97.7 F (36.5 C), Max:98.2 F (36.8 C)  Recent Labs  Lab 01/25/25 1759  WBC 9.7  CREATININE 0.96  LATICACIDVEN 1.5    Estimated Creatinine Clearance: 80.5 mL/min (by C-G formula based on SCr of 0.96 mg/dL).    Allergies[1]   Ronal CHRISTELLA Rav, PharmD 01/25/2025 8:17 PM     [1]  Allergies Allergen Reactions   Oxycodone  Shortness Of Breath

## 2025-01-25 NOTE — Anesthesia Procedure Notes (Signed)
 Procedure Name: LMA Insertion Date/Time: 01/25/2025 5:06 PM  Performed by: Cena Epps, CRNAPre-anesthesia Checklist: Patient identified, Emergency Drugs available, Suction available and Patient being monitored Patient Re-evaluated:Patient Re-evaluated prior to induction Oxygen Delivery Method: Circle System Utilized Preoxygenation: Pre-oxygenation with 100% oxygen Induction Type: IV induction Ventilation: Mask ventilation without difficulty LMA: LMA inserted LMA Size: 4.0 Number of attempts: 1 Airway Equipment and Method: Bite block Placement Confirmation: positive ETCO2 Tube secured with: Tape Dental Injury: Teeth and Oropharynx as per pre-operative assessment

## 2025-01-26 ENCOUNTER — Encounter (HOSPITAL_COMMUNITY): Payer: Self-pay | Admitting: Orthopedic Surgery

## 2025-01-26 DIAGNOSIS — Z981 Arthrodesis status: Secondary | ICD-10-CM

## 2025-01-26 DIAGNOSIS — D573 Sickle-cell trait: Secondary | ICD-10-CM

## 2025-01-26 DIAGNOSIS — I10 Essential (primary) hypertension: Secondary | ICD-10-CM | POA: Diagnosis not present

## 2025-01-26 DIAGNOSIS — L02612 Cutaneous abscess of left foot: Secondary | ICD-10-CM

## 2025-01-26 LAB — CBC
HCT: 38.2 % (ref 36.0–46.0)
Hemoglobin: 12.3 g/dL (ref 12.0–15.0)
MCH: 26 pg (ref 26.0–34.0)
MCHC: 32.2 g/dL (ref 30.0–36.0)
MCV: 80.8 fL (ref 80.0–100.0)
Platelets: 310 10*3/uL (ref 150–400)
RBC: 4.73 MIL/uL (ref 3.87–5.11)
RDW: 13.8 % (ref 11.5–15.5)
WBC: 7.4 10*3/uL (ref 4.0–10.5)
nRBC: 0 % (ref 0.0–0.2)

## 2025-01-26 LAB — SEDIMENTATION RATE: Sed Rate: 50 mm/h — ABNORMAL HIGH (ref 0–22)

## 2025-01-26 LAB — CREATININE, SERUM
Creatinine, Ser: 0.8 mg/dL (ref 0.44–1.00)
GFR, Estimated: >60 mL/min

## 2025-01-26 LAB — CK: Total CK: 77 U/L (ref 38–234)

## 2025-01-26 LAB — GLUCOSE, CAPILLARY: Glucose-Capillary: 114 mg/dL — ABNORMAL HIGH (ref 70–99)

## 2025-01-26 MED ORDER — DAPTOMYCIN-SODIUM CHLORIDE 700-0.9 MG/100ML-% IV SOLN
8.0000 mg/kg | Freq: Every day | INTRAVENOUS | Status: DC
  Administered 2025-01-26 – 2025-01-28 (×3): 700 mg via INTRAVENOUS
  Filled 2025-01-26 (×3): qty 100

## 2025-01-26 NOTE — Plan of Care (Signed)
" °  Problem: Clinical Measurements: Goal: Ability to avoid or minimize complications of infection will improve Outcome: Progressing   Problem: Safety: Goal: Ability to remain free from injury will improve Outcome: Progressing   Problem: Pain Managment: Goal: General experience of comfort will improve and/or be controlled Outcome: Progressing   Problem: Elimination: Goal: Will not experience complications related to urinary retention Outcome: Progressing   Problem: Coping: Goal: Level of anxiety will decrease Outcome: Progressing   Problem: Activity: Goal: Risk for activity intolerance will decrease Outcome: Progressing   Problem: Clinical Measurements: Goal: Ability to maintain clinical measurements within normal limits will improve Outcome: Progressing   "

## 2025-01-26 NOTE — Plan of Care (Signed)
   Problem: Activity: Goal: Risk for activity intolerance will decrease Outcome: Progressing   Problem: Pain Managment: Goal: General experience of comfort will improve and/or be controlled Outcome: Progressing   Problem: Safety: Goal: Ability to remain free from injury will improve Outcome: Progressing

## 2025-01-26 NOTE — Progress Notes (Signed)
 " PROGRESS NOTE  [name] FMW:[id number] DOB: 04/14/1961 DOA: 01/25/2025 PCP: Marvine Rush, MD   LOS: 0 days   Brief Narrative / Interim history: 64 year old female with sickle cell trait, obesity, HTN who comes into the hospital with left ankle infection.  She underwent subtalar and talonavicular joint arthrodesis in November 2025, and postop course complicated by wound dehiscence as well as abscess formation.  She was admitted and taken to the OR on 1/29 for I&D.  We are consulted for help with medical management  Subjective / 24h Interval events: She is doing well this morning, denies any fever or chills.  Pain is controlled.  Assesement and Plan: Principal problem Left foot cellulitis, ankle abscess -status post I&D in the OR by orthopedic surgery 1/29.  Has been started on empiric antibiotics postoperatively, monitor cultures - ID consulted as well, will follow recommendations  Active problems Essential hypertension-continue irbesartan /HCTZ, blood pressure controlled  Obesity, morbid-BMI 48, she would benefit from weight loss  Scheduled Meds:  enoxaparin  (LOVENOX ) injection  30 mg Subcutaneous Q24H   irbesartan   150 mg Oral Daily   And   hydrochlorothiazide   12.5 mg Oral Daily   senna  1 tablet Oral Daily   Continuous Infusions:  sodium chloride  50 mL/hr at 01/25/25 2149   vancomycin      PRN Meds:.acetaminophen , HYDROcodone -acetaminophen , magnesium  citrate, metoCLOPramide  **OR** metoCLOPramide  (REGLAN ) injection, morphine  injection, ondansetron  **OR** ondansetron  (ZOFRAN ) IV, polyethylene glycol, sorbitol , traMADol   Current Outpatient Medications  Medication Instructions   aspirin 81 mg, Oral, Daily   cholecalciferol (VITAMIN D) 1,000 Units, Daily   docusate sodium  (COLACE) 100 mg, Oral, 2 times daily, While taking narcotic pain medicine.   olmesartan -hydrochlorothiazide  (BENICAR  HCT) 20-12.5 MG tablet 1 tablet, Daily   Omega-3 Fatty Acids (FISH OIL PO) 1  capsule, Oral, Daily   oxymetazoline (AFRIN) 0.05 % nasal spray 1 spray, 2 times daily PRN    Diet Orders (From admission, onward)     Start     Ordered   01/25/25 1951  Diet Carb Modified Room service appropriate? Yes  Diet effective now       Question Answer Comment  Calorie Level Medium 1600-2000   Fluid consistency: Thin   Room service appropriate? Yes      01/25/25 1951            DVT prophylaxis: enoxaparin  (LOVENOX ) injection 30 mg Start: 01/26/25 0800   Lab Results  Component Value Date   PLT 310 01/26/2025      Code Status: Full Code  Family Communication: No family at bedside   Level of care: Med-Surg  Objective: Vitals:   01/25/25 1955 01/25/25 2123 01/25/25 2340 01/26/25 0535  BP: (!) 141/92 (!) 152/87 116/75 124/65  Pulse: 65 85 69 61  Resp: 15 16 16 18   Temp: 97.9 F (36.6 C) 98.4 F (36.9 C) 98.1 F (36.7 C) 98.1 F (36.7 C)  TempSrc: Oral Oral Oral Oral  SpO2: 93% 96% 96% 96%  Weight: 127.8 kg     Height: 5' 4 (1.626 m)       Intake/Output Summary (Last 24 hours) at 01/26/2025 0831 Last data filed at 01/26/2025 0600 Gross per 24 hour  Intake 1060 ml  Output 5 ml  Net 1055 ml   Wt Readings from Last 3 Encounters:  01/25/25 127.8 kg  01/02/25 127.2 kg  11/04/24 122.9 kg    Examination:  Constitutional: NAD Eyes: no scleral icterus ENMT: Mucous membranes are moist.  Neck: normal, supple Respiratory: clear  to auscultation bilaterally, no wheezing, no crackles. Normal respiratory effort.  Cardiovascular: Regular rate and rhythm, no murmurs / rubs / gallops. No LE edema.  Abdomen: non distended, no tenderness. Bowel sounds positive.  Musculoskeletal: no clubbing / cyanosis.    Data Reviewed: I have independently reviewed following labs and imaging studies   CBC Recent Labs  Lab 01/25/25 1759 01/26/25 0345  WBC 9.7 7.4  HGB 12.5 12.3  HCT 38.5 38.2  PLT 289 310  MCV 81.6 80.8  MCH 26.5 26.0  MCHC 32.5 32.2  RDW 14.1  13.8    Recent Labs  Lab 01/25/25 1759 01/26/25 0345  NA 142  --   K 4.4  --   CL 106  --   CO2 24  --   GLUCOSE 114*  --   BUN 12  --   CREATININE 0.96 0.80  CALCIUM 10.4*  --   LATICACIDVEN 1.5  --     ------------------------------------------------------------------------------------------------------------------ No results for input(s): CHOL, HDL, LDLCALC, TRIG, CHOLHDL, LDLDIRECT in the last 72 hours.  No results found for: HGBA1C ------------------------------------------------------------------------------------------------------------------ No results for input(s): TSH, T4TOTAL, T3FREE, THYROIDAB in the last 72 hours.  Invalid input(s): FREET3  Cardiac Enzymes No results for input(s): CKMB, TROPONINI, MYOGLOBIN in the last 168 hours.  Invalid input(s): CK ------------------------------------------------------------------------------------------------------------------ No results found for: BNP  CBG: No results for input(s): GLUCAP in the last 168 hours.  Recent Results (from the past 240 hours)  Aerobic/Anaerobic Culture w Gram Stain (surgical/deep wound)     Status: None (Preliminary result)   Collection Time: 01/25/25  5:21 PM   Specimen: Path Tissue  Result Value Ref Range Status   Specimen Description   Final    TISSUE Performed at [facility], [address] [place], [place] [address]    Special Requests   Final    LEFT FOOT Performed at [facility], [address], [place], [place] [address]    Gram Stain   Final    ABUNDANT WBC PRESENT, PREDOMINANTLY PMN RARE GRAM POSITIVE COCCI    Culture   Final    NO GROWTH < 12 HOURS Performed at [facility], [address], [place], [place] [address]    Report Status PENDING  Incomplete     Radiology Studies: No results found.   Nilda Fendt, MD, PhD [facility]  Between 7 am - 7 pm I am available,  please contact me via Amion (for emergencies) or Securechat (non urgent messages)  Between 7 pm - 7 am I am not available, please contact night coverage MD/APP via Amion  "

## 2025-01-26 NOTE — Progress Notes (Signed)
 Subjective: 1 Day Post-Op Procedures (LRB): IRRIGATION AND DEBRIDEMENT WOUND (Left) Patient reports pain as mild.  Controlled with oral meds.  Foot feels better than pre op.  Spoke with Dr. Fleeta Rothman.  He will change her to daptomycin .  Objective: Vital signs in last 24 hours: Temp:  [97.7 F (36.5 C)-98.4 F (36.9 C)] 98.4 F (36.9 C) (01/30 0931) Pulse Rate:  [51-85] 62 (01/30 0931) Resp:  [12-20] 18 (01/30 0931) BP: (116-202)/(63-105) 121/63 (01/30 0931) SpO2:  [92 %-100 %] 98 % (01/30 0931) Weight:  [127.8 kg] 127.8 kg (01/29 1955)  Intake/Output from previous day: 01/29 0701 - 01/30 0700 In: 1060 [P.O.:360; I.V.:700] Out: 5 [Blood:5] Intake/Output this shift: No intake/output data recorded.  Recent Labs    01/25/25 1759 01/26/25 0345  HGB 12.5 12.3   Recent Labs    01/25/25 1759 01/26/25 0345  WBC 9.7 7.4  RBC 4.72 4.73  HCT 38.5 38.2  PLT 289 310   Recent Labs    01/25/25 1759 01/26/25 0345  NA 142  --   K 4.4  --   CL 106  --   CO2 24  --   BUN 12  --   CREATININE 0.96 0.80  GLUCOSE 114*  --   CALCIUM 10.4*  --    No results for input(s): LABPT, INR in the last 72 hours.  PE:  dressing removed.  Erythema and swelling improved.  Scant SS drainage   Assessment/Plan: 1 Day Post-Op Procedures (LRB): IRRIGATION AND DEBRIDEMENT WOUND (Left) Dressing changed today.  Packing removed and replaced.  Will order RN dressing changes. Follow up cultures.  ID on board. Appreciate hospitalist assistance.      [name] 01/26/2025, 12:16 PM

## 2025-01-26 NOTE — Consult Note (Addendum)
 "       Date of Admission:  01/25/2025          Reason for Consult: post-operative abscess     Referring Provider: Norleen Armor, md   Assessment:  Postoperative foot abscess in patient who underwent  Left talonavicular and subtalar joint arthrodesis. HTN Sickle cell trait  Plan:  Continue daptomycin  Follow-up culture data Adjust antibiotics accordingly and hoping to send her home on oral antibiotic course with close follow-up with us  and Dr. Armor prior to stopping antibiotics Standard universal precautions   My partner Dr. Eben is covering this weekend for our group and available for questions he will follow-up on culture data and adjust antibiotics accordingly.  ADDENDUM:  I noted that she did not have HIV testing which is built into medicine admission algorithms and she was tested for [facility] and [facility] without HIV testing in past with someone clearly having ignoring the Best Practice alert to check and HIV antibody--unless it was not in place in 2015 but I believe it was. I will check an HIV RNA with am labs along with hepatitis serologies.   HPI: [name] is a 64 year old who had a left talonavicular and subtalar joint arthrodesis.  She had a complicated postoperative course with lateral hindfoot wound dehiscence.  She describes some bleeding from the operative site when the cast was removed but there was not any overt infection until relatively recently.  In the last week she had worsening swelling erythema and drainage from the wound.  She was found to have some fluctuance just anterior to the wound site.  She was brought the operating room yesterday by Dr. Armor.  She did not receive preoperative antibiotics from Dr. Armor prior to coming into the hospital and only received perioperative cefazolin  as cultures were collected.  In the operating room Dr. Armor encountered a left foot abscess which fortunately did not track to the hardware.  Irrigation and excisional  debridement of this deep abscess was performed yesterday and cultures were taken.  Gram stain shows gram-positive cocci the patient has been on vancomycin  we will switch her over to daptomycin  for now.  Hopefully she grows an organism for which we can use oral antibiotics and we can then send her out with the oral antibiotic regimen to make sure this abscess clears completely and does not come near the hardware.  Dr. Eben is covering this weekend for our group and available for questions he will follow-up on culture data and adjust antibiotics accordingly.   I personally spent a total of 80 minutes in the care of the patient today including preparing to see the patient, getting/reviewing separately obtained history, performing a medically appropriate exam/evaluation, counseling and educating, placing orders, referring and communicating with other health care professionals, documenting clinical information in the EHR, independently interpreting results, and coordinating care.   Evaluation of the patient requires complex antimicrobial therapy evaluation, counseling , isolation needs to reduce disease transmission and risk assessment and mitigation.     Review of Systems: Review of Systems  Constitutional:  Negative for chills, fever, malaise/fatigue and weight loss.  HENT:  Negative for congestion and sore throat.   Eyes:  Negative for blurred vision and photophobia.  Respiratory:  Negative for cough, shortness of breath and wheezing.   Cardiovascular:  Negative for chest pain, palpitations and leg swelling.  Gastrointestinal:  Negative for abdominal pain, blood in stool, constipation, diarrhea, heartburn, melena, nausea and vomiting.  Genitourinary:  Negative for dysuria,  flank pain and hematuria.  Musculoskeletal:  Negative for back pain, falls, joint pain and myalgias.  Skin:  Negative for itching and rash.  Neurological:  Negative for dizziness, focal weakness, loss of consciousness,  weakness and headaches.  Endo/Heme/Allergies:  Does not bruise/bleed easily.  Psychiatric/Behavioral:  Negative for depression and suicidal ideas. The patient does not have insomnia.     Past Medical History:  Diagnosis Date   Arthritis    Bronchitis    History of kidney stones    Hypertension    Obesity    Sickle cell trait     Social History[1]  Family History  Problem Relation Age of Onset   Hypertension Mother    Sickle cell trait Mother    Heart attack Father    Sickle cell anemia Sister    Sickle cell trait Sister    Allergies[2]  OBJECTIVE: Blood pressure 116/64, pulse 72, temperature 97.7 F (36.5 C), temperature source Oral, resp. rate 19, height 5' 4 (1.626 m), weight 127.8 kg, last menstrual period 06/19/2014, SpO2 97%.  Physical Exam Constitutional:      General: She is not in acute distress.    Appearance: Normal appearance. She is well-developed. She is not ill-appearing or diaphoretic.  HENT:     Head: Normocephalic and atraumatic.     Right Ear: Hearing and external ear normal.     Left Ear: Hearing and external ear normal.     Nose: No nasal deformity or rhinorrhea.  Eyes:     General: No scleral icterus.    Conjunctiva/sclera: Conjunctivae normal.     Right eye: Right conjunctiva is not injected.     Left eye: Left conjunctiva is not injected.     Pupils: Pupils are equal, round, and reactive to light.  Neck:     Vascular: No JVD.  Cardiovascular:     Rate and Rhythm: Normal rate and regular rhythm.     Heart sounds: S1 normal and S2 normal.  Abdominal:     General: Bowel sounds are normal. There is no distension.     Palpations: Abdomen is soft.     Tenderness: There is no abdominal tenderness.  Musculoskeletal:     Right shoulder: Normal.     Left shoulder: Normal.     Cervical back: Normal range of motion and neck supple.     Right hip: Normal.     Left hip: Normal.     Right knee: Normal.     Left knee: Normal.  Lymphadenopathy:      Head:     Right side of head: No submandibular, preauricular or posterior auricular adenopathy.     Left side of head: No submandibular, preauricular or posterior auricular adenopathy.     Cervical: No cervical adenopathy.     Right cervical: No superficial or deep cervical adenopathy.    Left cervical: No superficial or deep cervical adenopathy.  Skin:    General: Skin is warm and dry.     Coloration: Skin is not pale.     Findings: No abrasion, bruising, ecchymosis, erythema, lesion or rash.     Nails: There is no clubbing.  Neurological:     Mental Status: She is alert and oriented to person, place, and time.     Sensory: No sensory deficit.     Coordination: Coordination normal.     Gait: Gait normal.  Psychiatric:        Attention and Perception: She is attentive.  Speech: Speech normal.        Behavior: Behavior normal. Behavior is cooperative.        Thought Content: Thought content normal.        Judgment: Judgment normal.    Foot wrapped Lab Results Lab Results  Component Value Date   WBC 7.4 01/26/2025   HGB 12.3 01/26/2025   HCT 38.2 01/26/2025   MCV 80.8 01/26/2025   PLT 310 01/26/2025    Lab Results  Component Value Date   CREATININE 0.80 01/26/2025   BUN 12 01/25/2025   NA 142 01/25/2025   K 4.4 01/25/2025   CL 106 01/25/2025   CO2 24 01/25/2025    Lab Results  Component Value Date   ALT 15 07/05/2014   AST 17 07/05/2014   ALKPHOS 66 07/05/2014   BILITOT 0.7 07/05/2014     Microbiology: Recent Results (from the past 240 hours)  Aerobic/Anaerobic Culture w Gram Stain (surgical/deep wound)     Status: None (Preliminary result)   Collection Time: 01/25/25  5:21 PM   Specimen: Path Tissue  Result Value Ref Range Status   Specimen Description   Final    TISSUE Performed at [facility], [address] [place], [place] [address]    Special Requests   Final    LEFT FOOT Performed at [facility], [address] [place], [place] [address]    Gram Stain   Final    ABUNDANT WBC PRESENT, PREDOMINANTLY PMN RARE GRAM POSITIVE COCCI    Culture   Final    NO GROWTH < 12 HOURS Performed at [facility], [address], [place], [place] [address]    Report Status PENDING  Incomplete    Jomarie Fleeta Rothman, MD [facility] [phone] pager  01/26/2025, 3:04 PM      [1]  Social History Tobacco Use   Smoking status: Never   Smokeless tobacco: Never  Vaping Use   Vaping status: Never Used  Substance Use Topics   Alcohol use: No   Drug use: No  [2]  Allergies Allergen Reactions   Oxycodone  Shortness Of Breath   "

## 2025-01-27 LAB — COMPREHENSIVE METABOLIC PANEL WITH GFR
ALT: 13 U/L (ref 0–44)
AST: 16 U/L (ref 15–41)
Albumin: 3.7 g/dL (ref 3.5–5.0)
Alkaline Phosphatase: 106 U/L (ref 38–126)
Anion gap: 9 (ref 5–15)
BUN: 19 mg/dL (ref 8–23)
CO2: 26 mmol/L (ref 22–32)
Calcium: 10.5 mg/dL — ABNORMAL HIGH (ref 8.9–10.3)
Chloride: 107 mmol/L (ref 98–111)
Creatinine, Ser: 0.9 mg/dL (ref 0.44–1.00)
GFR, Estimated: >60 mL/min
Glucose, Bld: 105 mg/dL — ABNORMAL HIGH (ref 70–99)
Potassium: 5.4 mmol/L — ABNORMAL HIGH (ref 3.5–5.1)
Sodium: 141 mmol/L (ref 135–145)
Total Bilirubin: 0.5 mg/dL (ref 0.0–1.2)
Total Protein: 7.3 g/dL (ref 6.5–8.1)

## 2025-01-27 LAB — HEPATITIS B SURFACE ANTIGEN: Hepatitis B Surface Ag: NONREACTIVE

## 2025-01-27 LAB — POTASSIUM: Potassium: 4 mmol/L (ref 3.5–5.1)

## 2025-01-27 LAB — CBC
HCT: 36.6 % (ref 36.0–46.0)
Hemoglobin: 11.4 g/dL — ABNORMAL LOW (ref 12.0–15.0)
MCH: 25.9 pg — ABNORMAL LOW (ref 26.0–34.0)
MCHC: 31.1 g/dL (ref 30.0–36.0)
MCV: 83 fL (ref 80.0–100.0)
Platelets: 290 10*3/uL (ref 150–400)
RBC: 4.41 MIL/uL (ref 3.87–5.11)
RDW: 14 % (ref 11.5–15.5)
WBC: 8 10*3/uL (ref 4.0–10.5)
nRBC: 0 % (ref 0.0–0.2)

## 2025-01-27 LAB — MAGNESIUM: Magnesium: 2.2 mg/dL (ref 1.7–2.4)

## 2025-01-27 MED ORDER — ENOXAPARIN SODIUM 40 MG/0.4ML IJ SOSY
40.0000 mg | PREFILLED_SYRINGE | INTRAMUSCULAR | Status: DC
  Administered 2025-01-28 – 2025-01-30 (×3): 40 mg via SUBCUTANEOUS
  Filled 2025-01-27 (×3): qty 0.4

## 2025-01-27 MED ORDER — DIPHENHYDRAMINE HCL 25 MG PO CAPS
25.0000 mg | ORAL_CAPSULE | Freq: Four times a day (QID) | ORAL | Status: DC | PRN
  Administered 2025-01-27: 25 mg via ORAL
  Filled 2025-01-27: qty 1

## 2025-01-27 MED ORDER — DIPHENHYDRAMINE HCL 25 MG PO CAPS
25.0000 mg | ORAL_CAPSULE | Freq: Once | ORAL | Status: AC
  Administered 2025-01-27: 25 mg via ORAL
  Filled 2025-01-27: qty 1

## 2025-01-27 MED ORDER — HYDROXYZINE HCL 10 MG PO TABS
10.0000 mg | ORAL_TABLET | Freq: Three times a day (TID) | ORAL | Status: DC | PRN
  Administered 2025-01-28: 10 mg via ORAL
  Filled 2025-01-27 (×2): qty 1

## 2025-01-27 NOTE — Progress Notes (Signed)
 Patient requesting Benadryl  for sleep. Messaged Lavanda Horns.

## 2025-01-27 NOTE — Plan of Care (Signed)
" °  Problem: Education: Goal: Knowledge of the prescribed therapeutic regimen will improve Outcome: Progressing   Problem: Activity: Goal: Risk for activity intolerance will decrease Outcome: Progressing   Problem: Coping: Goal: Level of anxiety will decrease Outcome: Progressing   Problem: Pain Managment: Goal: General experience of comfort will improve and/or be controlled Outcome: Progressing   "

## 2025-01-27 NOTE — Progress Notes (Signed)
 " PROGRESS NOTE  [name] FMW:[id number] DOB: 13-Jul-1961 DOA: 01/25/2025 PCP: Marvine Rush, MD   LOS: 0 days   Brief Narrative / Interim history: 64 year old female with sickle cell trait, obesity, HTN who comes into the hospital with left ankle infection.  She underwent subtalar and talonavicular joint arthrodesis in November 2025, and postop course complicated by wound dehiscence as well as abscess formation.  She was admitted and taken to the OR on 1/29 for I&D.  We are consulted for help with medical management  Subjective / 24h Interval events: Doing well, no fever or chills.  Has been ambulating.  Pain is controlled  Assesement and Plan: Principal problem Left foot cellulitis, ankle abscess -status post I&D in the OR by orthopedic surgery 1/29.  Has been started on empiric antibiotics postoperatively, monitor cultures, preliminary showing GPC's - ID consulted as well, will follow recommendations, currently on daptomycin , narrow based on antibiogram  Active problems Essential hypertension-continue HCTZ, blood pressure controlled, hold ARB due to concern for hyperkalemia  Hyperkalemia-ruled out, on blood work this morning potassium was 5.4 but repeat is normal so it appears that the sample was an error/hemolyzed.  ARB has been discontinued, monitor closely blood pressure  Obesity, morbid-BMI 48, she would benefit from weight loss  Scheduled Meds:  enoxaparin  (LOVENOX ) injection  30 mg Subcutaneous Q24H   hydrochlorothiazide   12.5 mg Oral Daily   senna  1 tablet Oral Daily   Continuous Infusions:  DAPTOmycin  700 mg (01/26/25 1252)   PRN Meds:.acetaminophen , HYDROcodone -acetaminophen , magnesium  citrate, metoCLOPramide  **OR** metoCLOPramide  (REGLAN ) injection, morphine  injection, ondansetron  **OR** ondansetron  (ZOFRAN ) IV, polyethylene glycol, sorbitol , traMADol   Current Outpatient Medications  Medication Instructions   aspirin 81 mg, Oral, Daily   cholecalciferol  (VITAMIN D) 1,000 Units, Daily   docusate sodium  (COLACE) 100 mg, Oral, 2 times daily, While taking narcotic pain medicine.   olmesartan -hydrochlorothiazide  (BENICAR  HCT) 20-12.5 MG tablet 1 tablet, Daily   Omega-3 Fatty Acids (FISH OIL PO) 1 capsule, Oral, Daily   oxymetazoline (AFRIN) 0.05 % nasal spray 1 spray, 2 times daily PRN    Diet Orders (From admission, onward)     Start     Ordered   01/25/25 1951  Diet Carb Modified Room service appropriate? Yes  Diet effective now       Question Answer Comment  Calorie Level Medium 1600-2000   Fluid consistency: Thin   Room service appropriate? Yes      01/25/25 1951            DVT prophylaxis: enoxaparin  (LOVENOX ) injection 30 mg Start: 01/26/25 0800   Lab Results  Component Value Date   PLT 290 01/27/2025      Code Status: Full Code  Family Communication: No family at bedside   Level of care: Med-Surg  Objective: Vitals:   01/26/25 0931 01/26/25 1423 01/26/25 2112 01/27/25 0613  BP: 121/63 116/64 119/73 124/68  Pulse: 62 72 73 60  Resp: 18 19 16 18   Temp: 98.4 F (36.9 C) 97.7 F (36.5 C) 98.4 F (36.9 C) 97.7 F (36.5 C)  TempSrc: Oral Oral Oral Oral  SpO2: 98% 97% 97% 94%  Weight:      Height:        Intake/Output Summary (Last 24 hours) at 01/27/2025 1047 Last data filed at 01/27/2025 9077 Gross per 24 hour  Intake 1320 ml  Output --  Net 1320 ml   Wt Readings from Last 3 Encounters:  01/25/25 127.8 kg  01/02/25 127.2 kg  11/04/24  122.9 kg    Examination:  Constitutional: NAD Eyes: lids and conjunctivae normal, no scleral icterus ENMT: mmm Neck: normal, supple Respiratory: clear to auscultation bilaterally, no wheezing, no crackles. Normal respiratory effort.  Cardiovascular: Regular rate and rhythm, no murmurs / rubs / gallops. No LE edema. Abdomen: soft, no distention, no tenderness. Bowel sounds positive.    Data Reviewed: I have independently reviewed following labs and imaging  studies   CBC Recent Labs  Lab 01/25/25 1759 01/26/25 0345 01/27/25 0334  WBC 9.7 7.4 8.0  HGB 12.5 12.3 11.4*  HCT 38.5 38.2 36.6  PLT 289 310 290  MCV 81.6 80.8 83.0  MCH 26.5 26.0 25.9*  MCHC 32.5 32.2 31.1  RDW 14.1 13.8 14.0    Recent Labs  Lab 01/25/25 1759 01/26/25 0345 01/27/25 0334 01/27/25 0818  NA 142  --  141  --   K 4.4  --  5.4* 4.0  CL 106  --  107  --   CO2 24  --  26  --   GLUCOSE 114*  --  105*  --   BUN 12  --  19  --   CREATININE 0.96 0.80 0.90  --   CALCIUM 10.4*  --  10.5*  --   AST  --   --  16  --   ALT  --   --  13  --   ALKPHOS  --   --  106  --   BILITOT  --   --  0.5  --   ALBUMIN  --   --  3.7  --   MG  --   --  2.2  --   LATICACIDVEN 1.5  --   --   --     ------------------------------------------------------------------------------------------------------------------ No results for input(s): CHOL, HDL, LDLCALC, TRIG, CHOLHDL, LDLDIRECT in the last 72 hours.  No results found for: HGBA1C ------------------------------------------------------------------------------------------------------------------ No results for input(s): TSH, T4TOTAL, T3FREE, THYROIDAB in the last 72 hours.  Invalid input(s): FREET3  Cardiac Enzymes No results for input(s): CKMB, TROPONINI, MYOGLOBIN in the last 168 hours.  Invalid input(s): CK ------------------------------------------------------------------------------------------------------------------ No results found for: BNP  CBG: Recent Labs  Lab 01/26/25 2126  GLUCAP 114*    Recent Results (from the past 240 hours)  Aerobic/Anaerobic Culture w Gram Stain (surgical/deep wound)     Status: None (Preliminary result)   Collection Time: 01/25/25  5:21 PM   Specimen: Path Tissue  Result Value Ref Range Status   Specimen Description   Final    TISSUE Performed at [facility], [address], [place], [place] [address]    Special Requests    Final    LEFT FOOT Performed at [facility], [address], [place], [place] [address]    Gram Stain   Final    ABUNDANT WBC PRESENT, PREDOMINANTLY PMN RARE GRAM POSITIVE COCCI    Culture   Final    CULTURE REINCUBATED FOR BETTER GROWTH Performed at [facility], [address] [place], [place] [address]    Report Status PENDING  Incomplete     Radiology Studies: No results found.   Nilda Fendt, MD, PhD [facility]  Between 7 am - 7 pm I am available, please contact me via Amion (for emergencies) or Securechat (non urgent messages)  Between 7 pm - 7 am I am not available, please contact night coverage MD/APP via Amion  "

## 2025-01-27 NOTE — Progress Notes (Signed)
" ° °  Subjective:  64 y.o. female now POD 2 from left lateral foot I&D for abscess. She reports her pain is well controlled and she has been ambulating with her boot. No fevers or chills Otherwise, no acute events overnight. No numbness or tingling to the extremity.  Objective:   VITALS:   Vitals:   01/26/25 0931 01/26/25 1423 01/26/25 2112 01/27/25 0613  BP: 121/63 116/64 119/73 124/68  Pulse: 62 72 73 60  Resp: 18 19 16 18   Temp: 98.4 F (36.9 C) 97.7 F (36.5 C) 98.4 F (36.9 C) 97.7 F (36.5 C)  TempSrc: Oral Oral Oral Oral  SpO2: 98% 97% 97% 94%  Weight:      Height:        Physical Exam: General: Alert, no acute distress Cardiovascular: No pedal edema Respiratory: No cyanosis, no use of accessory musculature GI: No organomegaly, abdomen is soft and non-tender Skin: No lesions in the area of chief complaint Neurologic: Sensation intact distally Psychiatric: Patient is competent for consent with normal mood and affect Lymphatic: No axillary or cervical lymphadenopathy  MUSCULOSKELETAL: Left lower extremity - Skin without surrounding erythema or warmth.  Packing in place - TTP peri incisionally - Able to actively dorsiflex/plantar ankle and toes - Sensation intact to light touch sural, saphenous, tibial, deep and superficial peroneal nerve distributions - 2+ DP pulse  Lab Results  Component Value Date   WBC 8.0 01/27/2025   HGB 11.4 (L) 01/27/2025   HCT 36.6 01/27/2025   MCV 83.0 01/27/2025   PLT 290 01/27/2025   BMET    Component Value Date/Time   NA 141 01/27/2025 0334   NA 143 10/02/2021 1351   K 4.0 01/27/2025 0818   CL 107 01/27/2025 0334   CO2 26 01/27/2025 0334   GLUCOSE 105 (H) 01/27/2025 0334   BUN 19 01/27/2025 0334   BUN 13 10/02/2021 1351   CREATININE 0.90 01/27/2025 0334   CALCIUM 10.5 (H) 01/27/2025 0334   EGFR 70 10/02/2021 1351   GFRNONAA >60 01/27/2025 0334     Assessment/Plan: 64 y.o. female  now 2 Days Post-Op from Principal  Problem:   Abscess of left foot Active Problems:   Hypertension   Sickle cell trait   H/O arthrodesis .  Patient is doing well now POD 2 for left foot I&D.  She will continue to have daily packing changes with nursing and will continue on daptomycin . Cultures remain no growth to date. Discharge pending final ID recs on abx  Weight bearing: as tolerated in a boot LLE Pain control: tramadol  PT/OT DVT ppx: lovenox  Bowel regimen: senna Continue IV daptomycin , appreciate ID recs Dispo: pending final ID recs for outpatient abx   Rankin LELON Pizza 01/27/2025, 9:47 AM  "

## 2025-01-28 DIAGNOSIS — L02612 Cutaneous abscess of left foot: Secondary | ICD-10-CM | POA: Diagnosis not present

## 2025-01-28 LAB — BASIC METABOLIC PANEL WITH GFR
Anion gap: 11 (ref 5–15)
BUN: 20 mg/dL (ref 8–23)
CO2: 25 mmol/L (ref 22–32)
Calcium: 10.2 mg/dL (ref 8.9–10.3)
Chloride: 105 mmol/L (ref 98–111)
Creatinine, Ser: 1.02 mg/dL — ABNORMAL HIGH (ref 0.44–1.00)
GFR, Estimated: >60 mL/min
Glucose, Bld: 89 mg/dL (ref 70–99)
Potassium: 4.5 mmol/L (ref 3.5–5.1)
Sodium: 141 mmol/L (ref 135–145)

## 2025-01-28 MED ORDER — SODIUM CHLORIDE 0.9 % IV SOLN
2.0000 g | INTRAVENOUS | Status: DC
  Administered 2025-01-28 – 2025-01-29 (×2): 2 g via INTRAVENOUS
  Filled 2025-01-28 (×2): qty 20

## 2025-01-28 NOTE — Progress Notes (Signed)
" ° °  Subjective: 3 Days Post-Op Procedures (LRB): IRRIGATION AND DEBRIDEMENT WOUND (Left) Patient reports pain as mild.   Patient seen in rounds for Dr. Kit. Patient is well, and has had no acute complaints or problems. No acute events overnight. She was hoping to d/c home today, but we are still awaiting cultures.  We will continue therapy today.   Objective: Vital signs in last 24 hours: Temp:  [97.8 F (36.6 C)-98.4 F (36.9 C)] 98.4 F (36.9 C) (02/01 0918) Pulse Rate:  [56-64] 64 (02/01 0918) Resp:  [16-20] 19 (02/01 0918) BP: (118-141)/(67-88) 132/88 (02/01 0918) SpO2:  [96 %-100 %] 98 % (02/01 0918)  Intake/Output from previous day:  Intake/Output Summary (Last 24 hours) at 01/28/2025 0956 Last data filed at 01/28/2025 0900 Gross per 24 hour  Intake 740 ml  Output --  Net 740 ml     Intake/Output this shift: Total I/O In: 60 [P.O.:60] Out: -   Labs: Recent Labs    01/25/25 1759 01/26/25 0345 01/27/25 0334  HGB 12.5 12.3 11.4*   Recent Labs    01/26/25 0345 01/27/25 0334  WBC 7.4 8.0  RBC 4.73 4.41  HCT 38.2 36.6  PLT 310 290   Recent Labs    01/27/25 0334 01/27/25 0818 01/28/25 0349  NA 141  --  141  K 5.4* 4.0 4.5  CL 107  --  105  CO2 26  --  25  BUN 19  --  20  CREATININE 0.90  --  1.02*  GLUCOSE 105*  --  89  CALCIUM 10.5*  --  10.2   No results for input(s): LABPT, INR in the last 72 hours.  Exam: General - Patient is Alert and Oriented Extremity - Neurologically intact Sensation intact distally Intact pulses distally Dorsiflexion/Plantar flexion intact Dressing - dressing C/D/I Motor Function - intact, moving foot and toes well on exam.   Past Medical History:  Diagnosis Date   Arthritis    Bronchitis    History of kidney stones    Hypertension    Obesity    Sickle cell trait     Assessment/Plan: 3 Days Post-Op Procedures (LRB): IRRIGATION AND DEBRIDEMENT WOUND (Left) Principal Problem:   Abscess of left  foot Active Problems:   Hypertension   Sickle cell trait   H/O arthrodesis  Estimated body mass index is 48.36 kg/m as calculated from the following:   Height as of this encounter: 5' 4 (1.626 m).   Weight as of this encounter: 127.8 kg. Advance diet Up with therapy    Patient is doing well now POD 2 for left foot I&D.  She will continue to have daily packing changes with nursing and will continue on daptomycin . Gram stain shows gram positive cocci. Cultures remain no growth to date. Discharge pending final ID recs on abx   Weight bearing: as tolerated in a boot LLE Pain control: tramadol  PT/OT DVT ppx: lovenox  Bowel regimen: senna Continue IV daptomycin , appreciate ID recs Dispo: pending final ID recs for outpatient abx  Rosina Calin, PA-C Orthopedic Surgery [phone] 01/28/2025, 9:56 AM  "

## 2025-01-28 NOTE — Progress Notes (Signed)
 " PROGRESS NOTE  [name] FMW:[id number] DOB: [birth year] DOA: 01/25/2025 PCP: Marvine Rush, MD   LOS: 1 day   Brief Narrative / Interim history: 64 year old female with sickle cell trait, obesity, HTN who comes into the hospital with left ankle infection.  She underwent subtalar and talonavicular joint arthrodesis in November 2025, and postop course complicated by wound dehiscence as well as abscess formation.  She was admitted and taken to the OR on 1/29 for I&D.  We are consulted for help with medical management  Subjective / 24h Interval events: Doing well, no fever or chills.  Has been ambulating.  Pain is controlled  Assesement and Plan: Principal problem Left foot cellulitis, ankle abscess -status post I&D in the OR by orthopedic surgery 1/29.  Has been started on empiric antibiotics postoperatively, monitor cultures, preliminary showing GPC's - ID consulted as well, will follow recommendations, currently on daptomycin , narrow based on antibiogram, still pending today  Active problems Essential hypertension-continue HCTZ, blood pressure controlled, hold ARB due to higher potassium  Hyperkalemia-ruled out, on blood work on 1/31 potassium was 5.4 but repeat is normal so it appears that the sample was an error/hemolyzed.  ARB has been discontinued, potassium this morning 4.5.  Would favor holding ARB moving forward, blood pressure is overall stable  Obesity, morbid-BMI 48, she would benefit from weight loss  Scheduled Meds:  enoxaparin  (LOVENOX ) injection  40 mg Subcutaneous Q24H   hydrochlorothiazide   12.5 mg Oral Daily   senna  1 tablet Oral Daily   Continuous Infusions:  DAPTOmycin  700 mg (01/27/25 1405)   PRN Meds:.acetaminophen , diphenhydrAMINE , HYDROcodone -acetaminophen , hydrOXYzine , magnesium  citrate, metoCLOPramide  **OR** metoCLOPramide  (REGLAN ) injection, morphine  injection, ondansetron  **OR** ondansetron  (ZOFRAN ) IV, polyethylene glycol, sorbitol ,  traMADol   Current Outpatient Medications  Medication Instructions   aspirin 81 mg, Oral, Daily   cholecalciferol (VITAMIN D) 1,000 Units, Daily   docusate sodium  (COLACE) 100 mg, Oral, 2 times daily, While taking narcotic pain medicine.   olmesartan -hydrochlorothiazide  (BENICAR  HCT) 20-12.5 MG tablet 1 tablet, Daily   Omega-3 Fatty Acids (FISH OIL PO) 1 capsule, Oral, Daily   oxymetazoline (AFRIN) 0.05 % nasal spray 1 spray, 2 times daily PRN    Diet Orders (From admission, onward)     Start     Ordered   01/25/25 1951  Diet Carb Modified Room service appropriate? Yes  Diet effective now       Question Answer Comment  Calorie Level Medium 1600-2000   Fluid consistency: Thin   Room service appropriate? Yes      01/25/25 1951            DVT prophylaxis: enoxaparin  (LOVENOX ) injection 40 mg Start: 01/28/25 0800   Lab Results  Component Value Date   PLT 290 01/27/2025      Code Status: Full Code  Family Communication: No family at bedside   Level of care: Med-Surg  Objective: Vitals:   01/27/25 1253 01/27/25 2227 01/28/25 0543 01/28/25 0918  BP: (!) 141/84 118/67 119/73 132/88  Pulse: (!) 59 64 (!) 56 64  Resp: 19 20 16 19   Temp: 98 F (36.7 C) 97.8 F (36.6 C) 98.4 F (36.9 C) 98.4 F (36.9 C)  TempSrc: Oral   Oral  SpO2: 100% 96% 100% 98%  Weight:      Height:        Intake/Output Summary (Last 24 hours) at 01/28/2025 1120 Last data filed at 01/28/2025 1000 Gross per 24 hour  Intake 1180 ml  Output --  Net 1180  ml   Wt Readings from Last 3 Encounters:  01/25/25 127.8 kg  01/02/25 127.2 kg  11/04/24 122.9 kg    Examination: Constitutional: NAD Eyes: lids and conjunctivae normal, no scleral icterus ENMT: mmm Neck: normal, supple Respiratory: clear to auscultation bilaterally, no wheezing, no crackles. Normal respiratory effort.  Cardiovascular: Regular rate and rhythm, no murmurs / rubs / gallops. No LE edema. Abdomen: soft, no distention, no  tenderness. Bowel sounds positive.    Data Reviewed: I have independently reviewed following labs and imaging studies   CBC Recent Labs  Lab 01/25/25 1759 01/26/25 0345 01/27/25 0334  WBC 9.7 7.4 8.0  HGB 12.5 12.3 11.4*  HCT 38.5 38.2 36.6  PLT 289 310 290  MCV 81.6 80.8 83.0  MCH 26.5 26.0 25.9*  MCHC 32.5 32.2 31.1  RDW 14.1 13.8 14.0    Recent Labs  Lab 01/25/25 1759 01/26/25 0345 01/27/25 0334 01/27/25 0818 01/28/25 0349  NA 142  --  141  --  141  K 4.4  --  5.4* 4.0 4.5  CL 106  --  107  --  105  CO2 24  --  26  --  25  GLUCOSE 114*  --  105*  --  89  BUN 12  --  19  --  20  CREATININE 0.96 0.80 0.90  --  1.02*  CALCIUM 10.4*  --  10.5*  --  10.2  AST  --   --  16  --   --   ALT  --   --  13  --   --   ALKPHOS  --   --  106  --   --   BILITOT  --   --  0.5  --   --   ALBUMIN  --   --  3.7  --   --   MG  --   --  2.2  --   --   LATICACIDVEN 1.5  --   --   --   --     ------------------------------------------------------------------------------------------------------------------ No results for input(s): CHOL, HDL, LDLCALC, TRIG, CHOLHDL, LDLDIRECT in the last 72 hours.  No results found for: HGBA1C ------------------------------------------------------------------------------------------------------------------ No results for input(s): TSH, T4TOTAL, T3FREE, THYROIDAB in the last 72 hours.  Invalid input(s): FREET3  Cardiac Enzymes No results for input(s): CKMB, TROPONINI, MYOGLOBIN in the last 168 hours.  Invalid input(s): CK ------------------------------------------------------------------------------------------------------------------ No results found for: BNP  CBG: Recent Labs  Lab 01/26/25 2126  GLUCAP 114*    Recent Results (from the past 240 hours)  Aerobic/Anaerobic Culture w Gram Stain (surgical/deep wound)     Status: None (Preliminary result)   Collection Time: 01/25/25  5:21 PM   Specimen: Path  Tissue  Result Value Ref Range Status   Specimen Description   Final    TISSUE Performed at [facility], [address] [place], [place] [address]    Special Requests   Final    LEFT FOOT Performed at [facility], [address] [place], [place] [address]    Gram Stain   Final    ABUNDANT WBC PRESENT, PREDOMINANTLY PMN RARE GRAM POSITIVE COCCI Performed at [facility], [address], [place], [place] [address]    Culture   Final    CULTURE REINCUBATED FOR BETTER GROWTH NO ANAEROBES ISOLATED; CULTURE IN PROGRESS FOR 5 DAYS    Report Status PENDING  Incomplete     Radiology Studies: No results found.  Nilda Fendt, MD, PhD [facility]  Between 7 am - 7 pm I am available, please contact me via Amion (for emergencies) or Securechat (non urgent messages)  Between 7 pm - 7 am I am not available, please contact night coverage MD/APP via Amion  "

## 2025-01-29 ENCOUNTER — Other Ambulatory Visit (HOSPITAL_COMMUNITY): Payer: Self-pay

## 2025-01-29 DIAGNOSIS — L02612 Cutaneous abscess of left foot: Secondary | ICD-10-CM | POA: Diagnosis not present

## 2025-01-29 DIAGNOSIS — A491 Streptococcal infection, unspecified site: Secondary | ICD-10-CM | POA: Diagnosis not present

## 2025-01-29 DIAGNOSIS — D573 Sickle-cell trait: Secondary | ICD-10-CM | POA: Diagnosis not present

## 2025-01-29 DIAGNOSIS — Z981 Arthrodesis status: Secondary | ICD-10-CM | POA: Diagnosis not present

## 2025-01-29 LAB — HEPATITIS B SURFACE ANTIBODY, QUANTITATIVE: Hep B S AB Quant (Post): 9.2 m[IU]/mL — ABNORMAL LOW

## 2025-01-29 MED ORDER — HYDROCHLOROTHIAZIDE 12.5 MG PO TABS
12.5000 mg | ORAL_TABLET | Freq: Every day | ORAL | 0 refills | Status: AC
  Filled 2025-01-29: qty 30, 30d supply, fill #0

## 2025-01-29 NOTE — Progress Notes (Signed)
 Subjective: 4 Days Post-Op Procedures (LRB): IRRIGATION AND DEBRIDEMENT WOUND (Left) Patient reports pain as mild.  I D notes reviewed.  Susceptibilities pending.  She's eager to go home.  Objective: Vital signs in last 24 hours: Temp:  [97.8 F (36.6 C)-98.2 F (36.8 C)] 98.2 F (36.8 C) (02/02 0654) Pulse Rate:  [65-71] 70 (02/02 0654) Resp:  [18-19] 19 (02/02 0654) BP: (119-141)/(78-91) 133/91 (02/02 0654) SpO2:  [93 %-99 %] 99 % (02/02 0654)  Intake/Output from previous day: 02/01 0701 - 02/02 0700 In: 1100 [P.O.:1100] Out: 0  Intake/Output this shift: No intake/output data recorded.  Recent Labs    01/27/25 0334  HGB 11.4*   Recent Labs    01/27/25 0334  WBC 8.0  RBC 4.41  HCT 36.6  PLT 290   Recent Labs    01/27/25 0334 01/27/25 0818 01/28/25 0349  NA 141  --  141  K 5.4* 4.0 4.5  CL 107  --  105  CO2 26  --  25  BUN 19  --  20  CREATININE 0.90  --  1.02*  GLUCOSE 105*  --  89  CALCIUM 10.5*  --  10.2   No results for input(s): LABPT, INR in the last 72 hours.  PE:  wn wd woman in nad.  A and O.  L foot dressed and dry.  NVI.   Assessment/Plan: 4 Days Post-Op Procedures (LRB): IRRIGATION AND DEBRIDEMENT WOUND (Left) Per Dr. Fleeta Dam's note, susceptibilities will determine whether she can be discharged home on oral v. IV abx.  I'll get her discharge paperwork ready in case she can go home on oral abx.  I spoke with her nurse about the dressing change.      Norleen Armor 01/29/2025, 10:57 AM

## 2025-01-29 NOTE — Plan of Care (Signed)
" °  Problem: Education: Goal: Knowledge of the prescribed therapeutic regimen will improve Outcome: Progressing   Problem: Activity: Goal: Risk for activity intolerance will decrease Outcome: Progressing   Problem: Pain Managment: Goal: General experience of comfort will improve and/or be controlled Outcome: Progressing   "

## 2025-01-30 ENCOUNTER — Other Ambulatory Visit (HOSPITAL_COMMUNITY): Payer: Self-pay

## 2025-01-30 ENCOUNTER — Telehealth (HOSPITAL_COMMUNITY): Payer: Self-pay | Admitting: Pharmacy Technician

## 2025-01-30 DIAGNOSIS — L02612 Cutaneous abscess of left foot: Secondary | ICD-10-CM | POA: Diagnosis not present

## 2025-01-30 LAB — HIV-1 RNA QUANT-NO REFLEX-BLD
HIV 1 RNA Quant: <20 {copies}/mL
LOG10 HIV-1 RNA: UNDETERMINED {Log_copies}/mL

## 2025-01-30 MED ORDER — LINEZOLID 600 MG PO TABS
600.0000 mg | ORAL_TABLET | Freq: Two times a day (BID) | ORAL | 0 refills | Status: AC
  Filled 2025-01-30: qty 16, 8d supply, fill #0

## 2025-01-30 NOTE — Progress Notes (Signed)
 Subjective: 5 Days Post-Op Procedures (LRB): IRRIGATION AND DEBRIDEMENT WOUND (Left) Patient reports pain as minimal.  Tolerating regular diet. Eager to go home. ID has not been by yet today. Awaiting culture and sensitivity results.  Objective: Vital signs in last 24 hours: Temp:  [97.8 F (36.6 C)-98.6 F (37 C)] 98.4 F (36.9 C) (02/03 1111) Pulse Rate:  [63-69] 68 (02/03 1111) Resp:  [15-16] 16 (02/03 1111) BP: (134-150)/(78-88) 139/78 (02/03 1111) SpO2:  [94 %-97 %] 97 % (02/03 1111)  Intake/Output from previous day: 02/02 0701 - 02/03 0700 In: 900 [P.O.:800; IV Piggyback:100] Out: -  Intake/Output this shift: Total I/O In: 360 [P.O.:360] Out: -   No results for input(s): HGB in the last 72 hours. No results for input(s): WBC, RBC, HCT, PLT in the last 72 hours. Recent Labs    01/28/25 0349  NA 141  K 4.5  CL 105  CO2 25  BUN 20  CREATININE 1.02*  GLUCOSE 89  CALCIUM 10.2   No results for input(s): LABPT, INR in the last 72 hours.  PE: wnwd female in nad. AandO. Respirations even and unlabored. Dressings and ace wrap applied. Actively PF and DF toes. Sensation intact to light touch at the toes. Cap refill brisk at toes.   Assessment/Plan: 5 Days Post-Op Procedures (LRB): IRRIGATION AND DEBRIDEMENT WOUND (Left) Safe to discharge from an orthopedic perspective pending susceptibilities. Will defer to ID for IV vs oral abx. Continue wound packing and dressing changes.      Dickey A Steffon Gladu 01/30/2025, 11:41 AM

## 2025-01-30 NOTE — Progress Notes (Deleted)
 Discharge medications delivered to patient at the bedside in a secure bag. Discharge instructions given to patient questions asked and answered

## 2025-01-30 NOTE — Progress Notes (Signed)
 " PROGRESS NOTE  [name] FMW:[id number] DOB: 1961-10-11 DOA: 01/25/2025 PCP: Marvine Rush, MD   LOS: 3 days   Brief Narrative / Interim history: 64 year old female with sickle cell trait, obesity, HTN who comes into the hospital with left ankle infection.  She underwent subtalar and talonavicular joint arthrodesis in November 2025, and postop course complicated by wound dehiscence as well as abscess formation.  She was admitted and taken to the OR on 1/29 for I&D.  We are consulted for help with medical management  Subjective / 24h Interval events: Doing well, wants to go home.  Assesement and Plan: Principal problem Left foot cellulitis, ankle abscess -status post I&D in the OR by orthopedic surgery 1/29.  Has been started on empiric antibiotics postoperatively, monitor cultures, preliminary showing GPC's - ID consulted as well, will follow recommendations, currently on ceftriaxone , narrow based on antibiogram, still pending today, showing Streptococcus intermedius, if sensitive to penicillin ID recommends amoxicillin for 2 weeks postoperatively and to be seen by ID as an outpatient before finishing antibiotics  She remains stable to go today once microbiology results and antibiogram is available, will need 2 weeks of antibiotics as above   Active problems Essential hypertension-continue HCTZ, blood pressure controlled, hold ARB due to potassium on the higher side.  Blood pressure is acceptable  Hyperkalemia-ruled out, on blood work on 1/31 potassium was 5.4 but repeat is normal so it appears that the sample was an error/hemolyzed.  ARB has been discontinued, potassium remains on the higher end of normal.  Would favor holding ARB moving forward, blood pressure is overall stable  Obesity, morbid-BMI 48, she would benefit from weight loss  Scheduled Meds:  enoxaparin  (LOVENOX ) injection  40 mg Subcutaneous Q24H   hydrochlorothiazide   12.5 mg Oral Daily   senna  1 tablet Oral  Daily   Continuous Infusions:  cefTRIAXone  (ROCEPHIN )  IV 2 g (01/29/25 2025)   PRN Meds:.acetaminophen , diphenhydrAMINE , HYDROcodone -acetaminophen , hydrOXYzine , magnesium  citrate, metoCLOPramide  **OR** metoCLOPramide  (REGLAN ) injection, morphine  injection, ondansetron  **OR** ondansetron  (ZOFRAN ) IV, polyethylene glycol, sorbitol , traMADol   Current Outpatient Medications  Medication Instructions   aspirin 81 mg, Oral, Daily   cholecalciferol (VITAMIN D) 1,000 Units, Daily   docusate sodium  (COLACE) 100 mg, Oral, 2 times daily, While taking narcotic pain medicine.   hydrochlorothiazide  (HYDRODIURIL ) 12.5 mg, Oral, Daily   olmesartan -hydrochlorothiazide  (BENICAR  HCT) 20-12.5 MG tablet 1 tablet, Daily   Omega-3 Fatty Acids (FISH OIL PO) 1 capsule, Oral, Daily   oxymetazoline (AFRIN) 0.05 % nasal spray 1 spray, 2 times daily PRN    Diet Orders (From admission, onward)     Start     Ordered   01/25/25 1951  Diet Carb Modified Room service appropriate? Yes  Diet effective now       Question Answer Comment  Calorie Level Medium 1600-2000   Fluid consistency: Thin   Room service appropriate? Yes      01/25/25 1951            DVT prophylaxis: enoxaparin  (LOVENOX ) injection 40 mg Start: 01/28/25 0800   Lab Results  Component Value Date   PLT 290 01/27/2025      Code Status: Full Code  Family Communication: No family at bedside   Level of care: Med-Surg  Objective: Vitals:   01/29/25 0654 01/29/25 1333 01/29/25 2127 01/30/25 0603  BP: (!) 133/91 (!) 150/87 134/88 (!) 140/83  Pulse: 70 65 69 63  Resp: 19 16 15 15   Temp: 98.2 F (36.8 C) 98.6 F (37  C) 98.2 F (36.8 C) 97.8 F (36.6 C)  TempSrc: Oral Oral Oral Oral  SpO2: 99% 96% 94% 94%  Weight:      Height:        Intake/Output Summary (Last 24 hours) at 01/30/2025 9072 Last data filed at 01/30/2025 0913 Gross per 24 hour  Intake 1260 ml  Output --  Net 1260 ml   Wt Readings from Last 3 Encounters:   01/25/25 127.8 kg  01/02/25 127.2 kg  11/04/24 122.9 kg    Examination: Constitutional: NAD Eyes: lids and conjunctivae normal, no scleral icterus ENMT: mmm Neck: normal, supple Respiratory: clear to auscultation bilaterally, no wheezing, no crackles. Normal respiratory effort.  Cardiovascular: Regular rate and rhythm, no murmurs / rubs / gallops. No LE edema. Abdomen: soft, no distention, no tenderness. Bowel sounds positive.   Data Reviewed: I have independently reviewed following labs and imaging studies   CBC Recent Labs  Lab 01/25/25 1759 01/26/25 0345 01/27/25 0334  WBC 9.7 7.4 8.0  HGB 12.5 12.3 11.4*  HCT 38.5 38.2 36.6  PLT 289 310 290  MCV 81.6 80.8 83.0  MCH 26.5 26.0 25.9*  MCHC 32.5 32.2 31.1  RDW 14.1 13.8 14.0    Recent Labs  Lab 01/25/25 1759 01/26/25 0345 01/27/25 0334 01/27/25 0818 01/28/25 0349  NA 142  --  141  --  141  K 4.4  --  5.4* 4.0 4.5  CL 106  --  107  --  105  CO2 24  --  26  --  25  GLUCOSE 114*  --  105*  --  89  BUN 12  --  19  --  20  CREATININE 0.96 0.80 0.90  --  1.02*  CALCIUM 10.4*  --  10.5*  --  10.2  AST  --   --  16  --   --   ALT  --   --  13  --   --   ALKPHOS  --   --  106  --   --   BILITOT  --   --  0.5  --   --   ALBUMIN  --   --  3.7  --   --   MG  --   --  2.2  --   --   LATICACIDVEN 1.5  --   --   --   --     ------------------------------------------------------------------------------------------------------------------ No results for input(s): CHOL, HDL, LDLCALC, TRIG, CHOLHDL, LDLDIRECT in the last 72 hours.  No results found for: HGBA1C ------------------------------------------------------------------------------------------------------------------ No results for input(s): TSH, T4TOTAL, T3FREE, THYROIDAB in the last 72 hours.  Invalid input(s): FREET3  Cardiac Enzymes No results for input(s): CKMB, TROPONINI, MYOGLOBIN in the last 168 hours.  Invalid input(s):  CK ------------------------------------------------------------------------------------------------------------------ No results found for: BNP  CBG: Recent Labs  Lab 01/26/25 2126  GLUCAP 114*    Recent Results (from the past 240 hours)  Aerobic/Anaerobic Culture w Gram Stain (surgical/deep wound)     Status: None (Preliminary result)   Collection Time: 01/25/25  5:21 PM   Specimen: Path Tissue  Result Value Ref Range Status   Specimen Description   Final    TISSUE Performed at [facility], [address] [place], [place] [address]    Special Requests   Final    LEFT FOOT Performed at [facility], [address] [place], [place] [address]    Gram Stain   Final    ABUNDANT  WBC PRESENT, PREDOMINANTLY PMN RARE GRAM POSITIVE COCCI Performed at [facility], [address] [place], [place] [address]    Culture   Final    ABUNDANT STREPTOCOCCUS INTERMEDIUS CULTURE REINCUBATED FOR BETTER GROWTH NO ANAEROBES ISOLATED; CULTURE IN PROGRESS FOR 5 DAYS    Report Status PENDING  Incomplete     Radiology Studies: No results found.   Nilda Fendt, MD, PhD [facility]  Between 7 am - 7 pm I am available, please contact me via Amion (for emergencies) or Securechat (non urgent messages)  Between 7 pm - 7 am I am not available, please contact night coverage MD/APP via Amion  "

## 2025-01-30 NOTE — Progress Notes (Signed)
 Discharge medications delivered to patient at the bedside in a secure bag.

## 2025-02-02 LAB — AEROBIC/ANAEROBIC CULTURE W GRAM STAIN (SURGICAL/DEEP WOUND): Culture: NO GROWTH

## 2025-02-06 ENCOUNTER — Ambulatory Visit: Payer: Self-pay | Admitting: Infectious Diseases
